# Patient Record
Sex: Male | Born: 1945 | Race: Black or African American | Hispanic: No | Marital: Married | Smoking: Former smoker
Health system: Southern US, Community
[De-identification: ages and names within clinical notes are randomized; demographics above are authoritative.]

## PROBLEM LIST (undated history)

## (undated) DIAGNOSIS — E785 Hyperlipidemia, unspecified: Secondary | ICD-10-CM

## (undated) DIAGNOSIS — F32A Depression, unspecified: Secondary | ICD-10-CM

## (undated) DIAGNOSIS — I10 Essential (primary) hypertension: Secondary | ICD-10-CM

## (undated) DIAGNOSIS — F329 Major depressive disorder, single episode, unspecified: Secondary | ICD-10-CM

## (undated) DIAGNOSIS — J449 Chronic obstructive pulmonary disease, unspecified: Secondary | ICD-10-CM

## (undated) DIAGNOSIS — E119 Type 2 diabetes mellitus without complications: Secondary | ICD-10-CM

## (undated) DIAGNOSIS — E1142 Type 2 diabetes mellitus with diabetic polyneuropathy: Secondary | ICD-10-CM

## (undated) DIAGNOSIS — F4310 Post-traumatic stress disorder, unspecified: Secondary | ICD-10-CM

---

## 1981-08-24 HISTORY — PX: OTHER SURGICAL HISTORY: SHX169

## 2001-10-03 ENCOUNTER — Encounter: Payer: Self-pay | Admitting: Internal Medicine

## 2001-10-03 ENCOUNTER — Inpatient Hospital Stay (HOSPITAL_COMMUNITY): Admission: EM | Admit: 2001-10-03 | Discharge: 2001-10-05 | Payer: Self-pay | Admitting: Emergency Medicine

## 2001-10-05 ENCOUNTER — Encounter: Payer: Self-pay | Admitting: Internal Medicine

## 2011-03-26 HISTORY — PX: OTHER SURGICAL HISTORY: SHX169

## 2011-09-15 DIAGNOSIS — R079 Chest pain, unspecified: Secondary | ICD-10-CM

## 2012-11-21 ENCOUNTER — Encounter (HOSPITAL_COMMUNITY)
Admission: RE | Admit: 2012-11-21 | Discharge: 2012-11-21 | Disposition: A | Discharge status: Home / Self Care | Payer: Non-veteran care | Source: Ambulatory Visit | Attending: *Deleted | Admitting: *Deleted

## 2012-11-21 ENCOUNTER — Encounter (HOSPITAL_COMMUNITY): Payer: Self-pay

## 2012-11-21 VITALS — BP 128/82 | HR 85 | Ht 64.0 in | Wt 160.1 lb

## 2012-11-21 DIAGNOSIS — J449 Chronic obstructive pulmonary disease, unspecified: Secondary | ICD-10-CM

## 2012-11-21 NOTE — Patient Instructions (Signed)
Pt has finished orientation and is scheduled to start PR on _10/6/14 at 1 pm. Pt has been instructed to arrive to class 15 minutes early for scheduled class. Pt has been instructed to wear comfortable clothing and shoes with rubber soles. Pt has been told to take their medications 1 hour prior to coming to class.  If the patient is not going to attend class, he/she has been instructed to call.

## 2012-11-21 NOTE — Progress Notes (Addendum)
Mark Schroeder 67 y.o. male  Initial Psychosocial Assessment  Pt psychosocial assessment reveals pt lives with their spouse. Pt is currently unemployed, disabled. Pt hobbies include music. Pt reports his stress level is low. Areas of stress/anxiety include Health.  Pt does not exhibit signs of depression. Signs of depression include none and none. Pt shows fair  coping skills with positive outlook . Family Offered emotional support and reassurance. Monitor and evaluate progress toward psychosocial goal(s).  Goal(s): Improved management of pain with exercise.  Improved coping skills Help patient work toward returning to meaningful activities that improve patient's QOL and are attainable with patient's lung disease   Dr. [place] Bing: __________________________  Date: ___________   11/21/2012 11:34 AM

## 2012-11-21 NOTE — Progress Notes (Signed)
[facility] Dr. Sherre Scarlet is his Pulmonologist. During orientation advised patient on arrival and appointment times what to wear, what to do before, during and after exercise. Reviewed attendance and class policy. Talked about inclement weather and class consultation policy. Pt is scheduled to start Pulm Rehab on 11/27/12 at 1 pm. Pt was advised to come to class 5 minutes before class starts. He was also given instructions on meeting with the dietician and attending the Family Structure classes. Pt is eager to get started. Patient not able to do six minute walk test or bike test due to amputation.

## 2012-11-27 ENCOUNTER — Encounter (HOSPITAL_COMMUNITY)
Admission: RE | Admit: 2012-11-27 | Discharge: 2012-11-27 | Disposition: A | Discharge status: Home / Self Care | Payer: Non-veteran care | Source: Ambulatory Visit | Attending: *Deleted | Admitting: *Deleted

## 2012-11-27 DIAGNOSIS — J449 Chronic obstructive pulmonary disease, unspecified: Secondary | ICD-10-CM | POA: Insufficient documentation

## 2012-11-27 DIAGNOSIS — J4489 Other specified chronic obstructive pulmonary disease: Secondary | ICD-10-CM | POA: Insufficient documentation

## 2012-11-27 DIAGNOSIS — Z5189 Encounter for other specified aftercare: Secondary | ICD-10-CM | POA: Insufficient documentation

## 2012-11-29 ENCOUNTER — Encounter (HOSPITAL_COMMUNITY)
Admission: RE | Admit: 2012-11-29 | Discharge: 2012-11-29 | Disposition: A | Discharge status: Home / Self Care | Payer: Non-veteran care | Source: Ambulatory Visit | Attending: *Deleted | Admitting: *Deleted

## 2012-12-04 ENCOUNTER — Encounter (HOSPITAL_COMMUNITY)
Admission: RE | Admit: 2012-12-04 | Discharge: 2012-12-04 | Disposition: A | Discharge status: Home / Self Care | Payer: Non-veteran care | Source: Ambulatory Visit | Attending: *Deleted | Admitting: *Deleted

## 2012-12-06 ENCOUNTER — Encounter (HOSPITAL_COMMUNITY)
Admission: RE | Admit: 2012-12-06 | Discharge: 2012-12-06 | Disposition: A | Discharge status: Home / Self Care | Payer: Non-veteran care | Source: Ambulatory Visit | Attending: *Deleted | Admitting: *Deleted

## 2012-12-06 NOTE — Progress Notes (Signed)
Mark Schroeder 67 y.o. male  Patient 30 day Psychosocial Note  Patient psychosocial assessment reveals no barriers to participation in Pulmonary Rehab. There are currently no Psychosocial areas that are currently affecting patient's rehab experience. Patient does continue to exhibit positive coping skills to deal with his psychosocial concerns. Offered emotional support and reassurance. Patient does feel he is making progress toward Pulmonary Rehab goals. Patient reports his health and activity level has improved in the past 30 days as evidenced by patient's report of increased ability to move around better with less times of being SOB. Patient states family/friends have noticed changes in his activity or mood. Patient reports feeling positive about current and projected progression in Pulmonary Rehab. After reviewing the patient's treatment plan, the patient is making progress toward Pulmonary Rehab goals. Patient's rate of progress toward rehab goals is good. Plan of action to help patient continue to work towards rehab goals include continue to increase his levels to increase his strength and endurance. Will continue to monitor and evaluate progress toward psychosocial goal(s).  Goal(s) in progress: Continue to improve pain management of with exercise.  Continue to Improved coping skills Continue to help patient work toward returning to meaningful activities that improve patient's QOL and are attainable with patient's lung disease    Dr. [place] Bing: _________________________   Date: __________________ Medical Director

## 2012-12-06 NOTE — Progress Notes (Signed)
Pulmonary Rehab/Respiratory Services Outcomes Entrance   Anthropometrics:    Entrance:  Height: 71 inches Weight (kg): 78.4kg Grip Strength: 70  Functional Status/Exercise Capacity:  Mark Schroeder had a resting HR of 68 BPM, a resting BP of 118/72, and an oxygen saturation of 97% on 2LO2. Mark Schroeder performed a 6-minute walk test on 11/14/12. The patient completed 668 feet in 6 minutes with 3-4 rest breaks. This quantifies 1.96 mets.    Dyspnea Measures:  The [facility] is a simple and standardized method of classifying disability in patients with COPD. The assessment correlates disability and dyspnea. At entrance the patient scored a 2.   The patient completed the [place] [facility] Shortness of Breath Questionnaire ([facility] [place]). This questionnaire  relates activities of daily living and shortness of breath. The score ranges from 0-120, a higher score relates to severe shortness of breath during activities of daily living. The patient's score at entrance was 35.   Quality of Life:  Ferrans and Powers Quality of Life Index Pulmonary version is used to assess the patient satisfaction in different domains of their life; health and functioning, socioeconomic, psychological/spiritual, and family. The overall score is recorded out of 30 points. The patient's goal is to achieve an overall score of 21 or higher. The patient received a score of 21 at entrance.   The Patient Health Questionnaire (PHQ-9) assesses the degree of depression. Depression is important to monitor and track in pulmonary patients due to its prevalence in the population. The goal for a patient is to score less than 4 on this assessment. Mark Schroeder scored 0 at entrance.  Clinical Assessment Tools:  The COPD Assessment Test (CAT) is a measurement tool to quantify how much of an impact the disease has on the patient's life. The assessment aids the Pulmonary Rehab Team in designing the patients individualized  treatment plan. A CAT score ranges from 0-40. A score of 10 or below indicates that COPD has a low impact on the patient's life whereas a score of 30 or higher indicates a severe impact. The patient's goal is a decrease of 1 point from entrance to discharge. Mark Schroeder had a CAT score of        At entrance.   Nutrition:   The "Rate My Plate" is a dietary assessment that quantifies the balance of a patient's diet. The tool allows the Pulmonary Rehab Team to key in on the areas of the patient's diet that needs improving. The team can then focus their nutritional education on those areas. If the patient scores 23-38, this means there are many ways they can make their eating habits healthier, 39-54 states there are some ways they can make their eating habits healthier and a score of 55-69 states that they are making many healthy choices. Mark Schroeder achieved a score of        /14 at entrance.   Education:   Mark Schroeder has only come to 5 Pulmonary sessions at this time and in this time period has attended 2 classes. The classes that will be offered to the patient before graduation are How Lungs Work and Disease, Exercise, Environmental Irritants, Meds/Inhalers and Oxygen, Energy Saving Techniques/Energy Conservation, Bronchial Hygiene/Pursed Lip Breathing, Cleaning Equipment, Nutrition I Fats, Nutrition II Labels, Respiratory Infections, Stress I & II, Oxygen for Home and Travel. The patient completed an education assessment at the entrance of the program and will at discharge. This assessment included 14 questions regarding all of the education topics above.  Mark Schroeder achieved a score of 7/14 at entrance.   Exercise:   Mark Schroeder was provided with a Home Exercise Prescription (HEP) at the entrance of the program. The patient was followed by the Exercise Physiologist throughout the program to assist with progressing the frequency, intensity, time and type of exercise. At entrance of the program the patient was  exercise 0 # of days at home. The goal is for the patient to exercise at home on days not coming to the program. At the point of discharge this will be assessed to measure any improvement.

## 2012-12-07 NOTE — Progress Notes (Addendum)
Pulmonary Rehab/Respiratory Services Outcomes Entrance   Anthropometrics:    Entrance:  Height: 64 inches Weight (kg): 72.6kg Grip Strength: 82  Functional Status/Exercise Capacity:  Mark Schroeder had a resting HR of 85 BPM, a resting BP of 128/82, and an oxygen saturation of 98% on no LO2. Mark Schroeder did not perform a 6-minute walk test on 11/21/12 due to prosthetic leg. Marland Kitchen    Dyspnea Measures:  The [facility] is a simple and standardized method of classifying disability in patients with COPD. The assessment correlates disability and dyspnea. At entrance the patient scored a 4.   The patient completed the [place] [facility] Shortness of Breath Questionnaire ([facility] [place]). This questionnaire  relates activities of daily living and shortness of breath. The score ranges from 0-120, a higher score relates to severe shortness of breath during activities of daily living. The patient's score at entrance was 92.   Quality of Life:  Ferrans and Powers Quality of Life Index Pulmonary version is used to assess the patient satisfaction in different domains of their life; health and functioning, socioeconomic, psychological/spiritual, and family. The overall score is recorded out of 30 points. The patient's goal is to achieve an overall score of 21 or higher. The patient received a score of 20.06 at entrance.   The Patient Health Questionnaire (PHQ-9) assesses the degree of depression. Depression is important to monitor and track in pulmonary patients due to its prevalence in the population. The goal for a patient is to score less than 4 on this assessment. Mark Schroeder scored 0 at entrance.  Clinical Assessment Tools:  The COPD Assessment Test (CAT) is a measurement tool to quantify how much of an impact the disease has on the patient's life. The assessment aids the Pulmonary Rehab Team in designing the patients individualized treatment plan. A CAT score ranges from 0-40. A score of 10 or  below indicates that COPD has a low impact on the patient's life whereas a score of 30 or higher indicates a severe impact. The patient's goal is a decrease of 1 point from entrance to discharge. Mark Schroeder had a CAT score of  24  At entrance.   Nutrition:   The "Rate My Plate" is a dietary assessment that quantifies the balance of a patient's diet. The tool allows the Pulmonary Rehab Team to key in on the areas of the patient's diet that needs improving. The team can then focus their nutritional education on those areas. If the patient scores 23-38, this means there are many ways they can make their eating habits healthier, 39-54 states there are some ways they can make their eating habits healthier and a score of 55-69 states that they are making many healthy choices. Mark Schroeder achieved a score of  38/14 at entrance.   Education:   Mark Schroeder has only come to 4 Pulmonary sessions at this time and in this time period has attended 1 class. The classes that will be offered to the patient before graduation are How Lungs Work and Disease, Exercise, Environmental Irritants, Meds/Inhalers and Oxygen, Energy Saving Techniques/Energy Conservation, Bronchial Hygiene/Pursed Lip Breathing, Cleaning Equipment, Nutrition I Fats, Nutrition II Labels, Respiratory Infections, Stress I & II, Oxygen for Home and Travel. The patient completed an education assessment at the entrance of the program and will at discharge. This assessment included 14 questions regarding all of the education topics above. Mark Schroeder achieved a score of 8/14 at entrance.   Exercise:   Mark Schroeder was provided  with a Home Exercise Prescription (HEP) at the entrance of the program. The patient was followed by the Exercise Physiologist throughout the program to assist with progressing the frequency, intensity, time and type of exercise. At entrance of the program the patient was exercise 0 # of days at home. The goal is for the patient to exercise  at home on days not coming to the program. At the point of discharge this will be assessed to measure any improvement.     Dr. Ewa Gentry Bing: ____________________________  Date: ____________ Medical Director

## 2012-12-11 ENCOUNTER — Encounter (HOSPITAL_COMMUNITY)
Admission: RE | Admit: 2012-12-11 | Discharge: 2012-12-11 | Disposition: A | Discharge status: Home / Self Care | Payer: Non-veteran care | Source: Ambulatory Visit | Attending: *Deleted | Admitting: *Deleted

## 2012-12-13 ENCOUNTER — Encounter (HOSPITAL_COMMUNITY)
Admission: RE | Admit: 2012-12-13 | Discharge: 2012-12-13 | Disposition: A | Discharge status: Home / Self Care | Payer: Non-veteran care | Source: Ambulatory Visit | Attending: *Deleted | Admitting: *Deleted

## 2012-12-18 ENCOUNTER — Encounter (HOSPITAL_COMMUNITY)
Admission: RE | Admit: 2012-12-18 | Discharge: 2012-12-18 | Disposition: A | Discharge status: Home / Self Care | Payer: Non-veteran care | Source: Ambulatory Visit | Attending: *Deleted | Admitting: *Deleted

## 2012-12-20 ENCOUNTER — Encounter (HOSPITAL_COMMUNITY)
Admission: RE | Admit: 2012-12-20 | Discharge: 2012-12-20 | Disposition: A | Discharge status: Home / Self Care | Payer: Non-veteran care | Source: Ambulatory Visit | Attending: *Deleted | Admitting: *Deleted

## 2012-12-25 ENCOUNTER — Encounter (HOSPITAL_COMMUNITY): Payer: Non-veteran care

## 2012-12-27 ENCOUNTER — Encounter (HOSPITAL_COMMUNITY)
Admission: RE | Admit: 2012-12-27 | Discharge: 2012-12-27 | Disposition: A | Discharge status: Home / Self Care | Payer: Non-veteran care | Source: Ambulatory Visit | Attending: *Deleted | Admitting: *Deleted

## 2012-12-27 DIAGNOSIS — J449 Chronic obstructive pulmonary disease, unspecified: Secondary | ICD-10-CM | POA: Insufficient documentation

## 2012-12-27 DIAGNOSIS — Z5189 Encounter for other specified aftercare: Secondary | ICD-10-CM | POA: Insufficient documentation

## 2012-12-27 DIAGNOSIS — J4489 Other specified chronic obstructive pulmonary disease: Secondary | ICD-10-CM | POA: Insufficient documentation

## 2012-12-29 NOTE — Progress Notes (Addendum)
Mark Schroeder 67 y.o. male  Patient 60 day Psychosocial Note  Patient psychosocial assessment reveals no barriers to participation in Pulmonary Rehab. There are currently no Psychosocial areas that are currently affecting patient's rehab experience. This patient is a amputee of (R) leg. This tends to make him a little more reserved and quiet. Patient does continue to exhibit positive coping skills to deal with his amputation. Staff continues to offered emotional support and reassurance by engaging him and making heim feel like a part of the group. Patient does feel heis making progress toward Pulmonary Rehab goals. Patient reports his health and activity level has improved in the past 30 days as evidenced by patient's report of increased ability to return to some weight training with limited SOB. Patient has not stated family/friends have noticed changes in his activity or mood. Patient reports he is feeling positive about current and projected progression in Pulmonary Rehab. After reviewing the patient's treatment plan, the patient is making progress toward Pulmonary Rehab goals. Patient's rate of progress toward rehab goals is good. Plan of action to help patient continue to work towards rehab goals include increased strength and endurance. Will continue to monitor and evaluate progress toward psychosocial goal(s).  Goal(s) in progress: Continue to improve pain management of of leg with exercise.  Continue to Improved coping skills as it pertains to being apart of the group.  Continue to help patient work toward returning to meaningful activities that improve patient's QOL and are attainable with patient's lung disease.    Dr. Prentice Docker: ______________________________ Date: ___________Time: ________ Medial Director

## 2013-01-01 ENCOUNTER — Encounter (HOSPITAL_COMMUNITY)
Admission: RE | Admit: 2013-01-01 | Discharge: 2013-01-01 | Disposition: A | Discharge status: Home / Self Care | Payer: Non-veteran care | Source: Ambulatory Visit | Attending: *Deleted | Admitting: *Deleted

## 2013-01-03 ENCOUNTER — Encounter (HOSPITAL_COMMUNITY)
Admission: RE | Admit: 2013-01-03 | Discharge: 2013-01-03 | Disposition: A | Discharge status: Home / Self Care | Payer: Non-veteran care | Source: Ambulatory Visit | Attending: *Deleted | Admitting: *Deleted

## 2013-01-08 ENCOUNTER — Encounter (HOSPITAL_COMMUNITY): Payer: Non-veteran care

## 2013-01-10 ENCOUNTER — Encounter (HOSPITAL_COMMUNITY): Payer: Non-veteran care

## 2013-01-15 ENCOUNTER — Encounter (HOSPITAL_COMMUNITY)
Admission: RE | Admit: 2013-01-15 | Discharge: 2013-01-15 | Disposition: A | Discharge status: Home / Self Care | Payer: Non-veteran care | Source: Ambulatory Visit | Attending: *Deleted | Admitting: *Deleted

## 2013-01-17 ENCOUNTER — Encounter (HOSPITAL_COMMUNITY): Payer: Non-veteran care

## 2013-01-22 ENCOUNTER — Encounter (HOSPITAL_COMMUNITY): Payer: Non-veteran care

## 2013-01-24 ENCOUNTER — Encounter (HOSPITAL_COMMUNITY)
Admission: RE | Admit: 2013-01-24 | Discharge: 2013-01-24 | Disposition: A | Discharge status: Home / Self Care | Payer: Non-veteran care | Source: Ambulatory Visit | Attending: *Deleted | Admitting: *Deleted

## 2013-01-24 DIAGNOSIS — J449 Chronic obstructive pulmonary disease, unspecified: Secondary | ICD-10-CM | POA: Insufficient documentation

## 2013-01-24 DIAGNOSIS — Z5189 Encounter for other specified aftercare: Secondary | ICD-10-CM | POA: Insufficient documentation

## 2013-01-24 DIAGNOSIS — J4489 Other specified chronic obstructive pulmonary disease: Secondary | ICD-10-CM | POA: Insufficient documentation

## 2013-01-29 ENCOUNTER — Encounter (HOSPITAL_COMMUNITY)
Admission: RE | Admit: 2013-01-29 | Discharge: 2013-01-29 | Disposition: A | Discharge status: Home / Self Care | Payer: Non-veteran care | Source: Ambulatory Visit | Attending: *Deleted | Admitting: *Deleted

## 2013-01-31 ENCOUNTER — Encounter (HOSPITAL_COMMUNITY)
Admission: RE | Admit: 2013-01-31 | Discharge: 2013-01-31 | Disposition: A | Discharge status: Home / Self Care | Payer: Non-veteran care | Source: Ambulatory Visit | Attending: *Deleted | Admitting: *Deleted

## 2013-02-05 ENCOUNTER — Encounter (HOSPITAL_COMMUNITY)
Admission: RE | Admit: 2013-02-05 | Discharge: 2013-02-05 | Disposition: A | Discharge status: Home / Self Care | Payer: Non-veteran care | Source: Ambulatory Visit | Attending: *Deleted | Admitting: *Deleted

## 2013-02-06 NOTE — Progress Notes (Signed)
Mark Schroeder 67 y.o. male  90 day day Psychosocial Note  Patient psychosocial assessment reveals no barriers to participation in Pulmonary Rehab. Psychosocial areas that are currently affecting patient's rehab experience include concerns about having to deal with his granddaughter from time to time. He is a amputee of (R) leg and has begun to adjust to his classmates knowing this about him. Patient does continue to exhibit positive coping skills to deal with his amputation. Offered emotional support and reassurance. Patient does feel he is making progress toward Pulmonary Rehab goals. Patient reports his health and activity level has improved in the past 30 days as evidenced by patient's report of increased ability to do more. Patient states family/friends have noticed changes in his activity or mood. Patient reports is feeling positive about current and projected progression in Pulmonary Rehab. After reviewing the patient's treatment plan, the patient is making progress toward Pulmonary Rehab goals. Patient's rate of progress toward rehab goals is excellent. Plan of action to help patient continue to work towards rehab goals include continued strength and endurance. Will continue to monitor and evaluate progress toward psychosocial goal(s). [facility] [place] [facility] so that he can continue to improve.   Goal(s) in progress: Continue to improve pain mgmt of his leg with exercise.  Continue to be an active member of the group experience.  Help patient work toward returning to meaningful activities that improve patient's QOL and are attainable with patient's lung disease   Dr. Laqueta Linden: _____________Date:__________Time__________________ Medical Director

## 2013-02-07 ENCOUNTER — Encounter (HOSPITAL_COMMUNITY): Payer: Non-veteran care

## 2013-02-12 ENCOUNTER — Encounter (HOSPITAL_COMMUNITY)
Admission: RE | Admit: 2013-02-12 | Discharge: 2013-02-12 | Disposition: A | Discharge status: Home / Self Care | Payer: Non-veteran care | Source: Ambulatory Visit | Attending: *Deleted | Admitting: *Deleted

## 2013-02-14 ENCOUNTER — Encounter (HOSPITAL_COMMUNITY): Payer: Non-veteran care

## 2013-02-19 ENCOUNTER — Encounter (HOSPITAL_COMMUNITY)
Admission: RE | Admit: 2013-02-19 | Discharge: 2013-02-19 | Disposition: A | Discharge status: Home / Self Care | Payer: Non-veteran care | Source: Ambulatory Visit | Attending: *Deleted | Admitting: *Deleted

## 2013-02-21 ENCOUNTER — Encounter (HOSPITAL_COMMUNITY): Payer: Non-veteran care

## 2013-02-26 ENCOUNTER — Encounter (HOSPITAL_COMMUNITY): Payer: PRIVATE HEALTH INSURANCE

## 2013-02-28 ENCOUNTER — Encounter (HOSPITAL_COMMUNITY): Payer: PRIVATE HEALTH INSURANCE

## 2013-03-05 ENCOUNTER — Encounter (HOSPITAL_COMMUNITY)
Admission: RE | Admit: 2013-03-05 | Discharge: 2013-03-05 | Disposition: A | Discharge status: Home / Self Care | Payer: PRIVATE HEALTH INSURANCE | Source: Ambulatory Visit | Attending: *Deleted | Admitting: *Deleted

## 2013-03-05 DIAGNOSIS — Z5189 Encounter for other specified aftercare: Secondary | ICD-10-CM | POA: Insufficient documentation

## 2013-03-05 DIAGNOSIS — J449 Chronic obstructive pulmonary disease, unspecified: Secondary | ICD-10-CM | POA: Insufficient documentation

## 2013-03-05 DIAGNOSIS — J4489 Other specified chronic obstructive pulmonary disease: Secondary | ICD-10-CM | POA: Insufficient documentation

## 2013-03-07 ENCOUNTER — Encounter (HOSPITAL_COMMUNITY)
Admission: RE | Admit: 2013-03-07 | Discharge: 2013-03-07 | Disposition: A | Discharge status: Home / Self Care | Payer: PRIVATE HEALTH INSURANCE | Source: Ambulatory Visit | Attending: *Deleted | Admitting: *Deleted

## 2013-03-12 ENCOUNTER — Encounter (HOSPITAL_COMMUNITY)
Admission: RE | Admit: 2013-03-12 | Discharge: 2013-03-12 | Disposition: A | Discharge status: Home / Self Care | Payer: PRIVATE HEALTH INSURANCE | Source: Ambulatory Visit | Attending: *Deleted | Admitting: *Deleted

## 2013-03-14 ENCOUNTER — Encounter (HOSPITAL_COMMUNITY): Payer: PRIVATE HEALTH INSURANCE

## 2013-03-19 ENCOUNTER — Encounter (HOSPITAL_COMMUNITY)
Admission: RE | Admit: 2013-03-19 | Discharge: 2013-03-19 | Disposition: A | Discharge status: Home / Self Care | Payer: PRIVATE HEALTH INSURANCE | Source: Ambulatory Visit | Attending: *Deleted | Admitting: *Deleted

## 2013-03-20 NOTE — Psychosocial Assessment (Signed)
Mark Schroeder 68 y.o. male  120 day Psychosocial Note  Patient psychosocial assessment reveals no barriers to participation in Pulmonary Rehab. Psychosocial areas that are currently affecting patient's rehab experience include concerns about family problems surrounding his granddaughter from time to time. Patient does continue to exhibit positive coping skills to deal with his psychosocial concerns. Offered emotional support and reassurance. Patient does feel he is making progress toward Pulmonary Rehab goals. Patient reports his health and activity level has improved in the past 30 days as evidenced by patient's report of increased ability to do more and carry out ADLs. Patient states family/friends have noticed changes in hisactivity or mood. Patient reports he is feeling positive about current and projected progression in Pulmonary Rehab. After reviewing the patient's treatment plan, the patient is making progress toward Pulmonary Rehab goals. Patient's rate of progress toward rehab goals is excellent. Plan of action to help patient continue to work towards rehab goals include continued strength and endurance. Will continue to monitor and evaluate progress toward psychosocial goal(s). [facility] so that patient can get his 36 sessions.   Goal(s) in progress: Improved pain management of his leg during exercise.  Continue to be an active participant of the group experience.  Help patient work toward returning to meaningful activities that improve patient's QOL and are attainable with patient's lung disease.     Dr. Laqueta LindenSuresh A. Koneswaran: _____________ Date: ___________ Time: _____________ Medical Director

## 2013-03-21 ENCOUNTER — Encounter (HOSPITAL_COMMUNITY)
Admission: RE | Admit: 2013-03-21 | Discharge: 2013-03-21 | Disposition: A | Discharge status: Home / Self Care | Payer: PRIVATE HEALTH INSURANCE | Source: Ambulatory Visit | Attending: *Deleted | Admitting: *Deleted

## 2013-03-26 ENCOUNTER — Encounter (HOSPITAL_COMMUNITY)
Admission: RE | Admit: 2013-03-26 | Discharge: 2013-03-26 | Disposition: A | Discharge status: Home / Self Care | Payer: Non-veteran care | Source: Ambulatory Visit | Attending: *Deleted | Admitting: *Deleted

## 2013-03-26 DIAGNOSIS — Z5189 Encounter for other specified aftercare: Secondary | ICD-10-CM | POA: Insufficient documentation

## 2013-03-26 DIAGNOSIS — J4489 Other specified chronic obstructive pulmonary disease: Secondary | ICD-10-CM | POA: Insufficient documentation

## 2013-03-26 DIAGNOSIS — J449 Chronic obstructive pulmonary disease, unspecified: Secondary | ICD-10-CM | POA: Insufficient documentation

## 2013-03-28 ENCOUNTER — Encounter (HOSPITAL_COMMUNITY)
Admission: RE | Admit: 2013-03-28 | Discharge: 2013-03-28 | Disposition: A | Discharge status: Home / Self Care | Payer: Non-veteran care | Source: Ambulatory Visit | Attending: *Deleted | Admitting: *Deleted

## 2013-04-02 ENCOUNTER — Encounter (HOSPITAL_COMMUNITY)
Admission: RE | Admit: 2013-04-02 | Discharge: 2013-04-02 | Disposition: A | Discharge status: Home / Self Care | Payer: Non-veteran care | Source: Ambulatory Visit | Attending: *Deleted | Admitting: *Deleted

## 2013-04-04 ENCOUNTER — Encounter (HOSPITAL_COMMUNITY)
Admission: RE | Admit: 2013-04-04 | Discharge: 2013-04-04 | Disposition: A | Discharge status: Home / Self Care | Payer: Non-veteran care | Source: Ambulatory Visit | Attending: *Deleted | Admitting: *Deleted

## 2013-04-09 ENCOUNTER — Encounter (HOSPITAL_COMMUNITY)
Admission: RE | Admit: 2013-04-09 | Discharge: 2013-04-09 | Disposition: A | Discharge status: Home / Self Care | Payer: Non-veteran care | Source: Ambulatory Visit | Attending: *Deleted | Admitting: *Deleted

## 2013-04-11 ENCOUNTER — Encounter (HOSPITAL_COMMUNITY): Payer: Non-veteran care

## 2013-04-11 NOTE — Addendum Note (Signed)
Encounter addended by: Angelica PouNancy L Warda Mcqueary, RN on: 04/11/2013  3:38 PM<BR>     Documentation filed: Clinical Notes

## 2013-04-11 NOTE — Progress Notes (Signed)
Pulmonary Rehabilitation Program Outcomes Report   Orientation:  11/21/2012 Halfway Report: 02/19/2013 Graduate Date:  tbd Discharge Date:  tbd # of sessions completed: 18 DX: COPD  Pulmonologist: welty-Wolf Family MD:  Reid [place] Class Time:  13:00  A.  Exercise Program:  Tolerates exercise @ 3.76 METS for 15 minutes  B.  Mental Health:  Good mental attitude  C.  Education/Instruction/Skills  Accurately checks own pulse.  Rest:  96  Exercise:  130, Knows THR for exercise and Uses Perceived Exertion Scale and/or Dyspnea Scale  Uses Perceived Exertion Scale and/or Dyspnea Scale  D.  Nutrition/Weight Control/Body Composition:  Adherence to prescribed nutrition program: good    E.  Blood Lipids    No results found for this basename: CHOL, HDL, LDLCALC, LDLDIRECT, TRIG, CHOLHDL    F.  Lifestyle Changes:  Making positive lifestyle changes and Continues to smoke  G.  Symptoms noted with exercise:  Asymptomatic  Report Completed By:  Lelon HuhNancy L. Anuoluwapo Mefferd RN   Comments:  This is patients halfway report. He has done well while in rehab. He achieved a peak METS of 3.76. His resting HR was 96 and resting BP was 150/90 and his peak HR was 130 and peak BP was 160/90. A graduation report will follow upon his 36th visit.

## 2013-04-11 NOTE — Addendum Note (Signed)
Encounter addended by: Angelica PouNancy L Skyeler Smola, RN on: 04/11/2013  3:37 PM<BR>     Documentation filed: Notes Section

## 2013-04-11 NOTE — Progress Notes (Signed)
Pulmonary Rehabilitation Program Outcomes Report   Orientation:  11/21/2012 1st week report: 12/04/2012 Graduate Date:  tbd Discharge Date:  tbd # of sessions completed: 3 ZO:XWRUX:COPD  Pulmonologist: Welty-Wolf Family MD:  Dr. [place] Reid Class Time:  13:00  A.  Exercise Program:  Tolerates exercise @ 3.76 METS for 15 minutes and Walk Test Results:  Pre: No Walk test done due to patients artifical Leg.  B.  Mental Health:  Good mental attitude  C.  Education/Instruction/Skills  Accurately checks own pulse.  Rest:  99  Exercise:  131, Knows THR for exercise and Uses Perceived Exertion Scale and/or Dyspnea Scale  Uses Perceived Exertion Scale and/or Dyspnea Scale  D.  Nutrition/Weight Control/Body Composition:  Adherence to prescribed nutrition program: good    E.  Blood Lipids    No results found for this basename: CHOL, HDL, LDLCALC, LDLDIRECT, TRIG, CHOLHDL    F.  Lifestyle Changes:  Making positive lifestyle changes and Continues to smoke  G.  Symptoms noted with exercise:  Asymptomatic  Report Completed By:  Harriett SineNancy l. Vilma Will RN   Comments:  This is patients 1st week report. He has done well his first week. He achieved a peak METS of 3.76. His resting HR was 99 and resting BP was 130/70, His peak HR was 131 and his peak BP was 120/70. A halfway report will follow upon patients 18 th visit.

## 2013-04-11 NOTE — Addendum Note (Signed)
Encounter addended by: Angelica PouNancy L Lanya Bucks, RN on: 04/11/2013  3:13 PM<BR>     Documentation filed: Notes Section

## 2013-04-11 NOTE — Addendum Note (Signed)
Encounter addended by: Angelica PouNancy L Tatsuya Okray, RN on: 04/11/2013  3:14 PM<BR>     Documentation filed: Clinical Notes

## 2013-04-16 ENCOUNTER — Encounter (HOSPITAL_COMMUNITY)
Admission: RE | Admit: 2013-04-16 | Discharge: 2013-04-16 | Disposition: A | Discharge status: Home / Self Care | Payer: Non-veteran care | Source: Ambulatory Visit | Attending: *Deleted | Admitting: *Deleted

## 2013-04-18 ENCOUNTER — Encounter (HOSPITAL_COMMUNITY)
Admission: RE | Admit: 2013-04-18 | Discharge: 2013-04-18 | Disposition: A | Discharge status: Home / Self Care | Payer: Non-veteran care | Source: Ambulatory Visit | Attending: *Deleted | Admitting: *Deleted

## 2013-04-23 ENCOUNTER — Encounter (HOSPITAL_COMMUNITY)
Admission: RE | Admit: 2013-04-23 | Discharge: 2013-04-23 | Disposition: A | Discharge status: Home / Self Care | Payer: Non-veteran care | Source: Ambulatory Visit | Attending: *Deleted | Admitting: *Deleted

## 2013-04-23 DIAGNOSIS — J449 Chronic obstructive pulmonary disease, unspecified: Secondary | ICD-10-CM | POA: Insufficient documentation

## 2013-04-23 DIAGNOSIS — J4489 Other specified chronic obstructive pulmonary disease: Secondary | ICD-10-CM | POA: Insufficient documentation

## 2013-04-23 DIAGNOSIS — Z5189 Encounter for other specified aftercare: Secondary | ICD-10-CM | POA: Insufficient documentation

## 2013-04-25 ENCOUNTER — Encounter (HOSPITAL_COMMUNITY)
Admission: RE | Admit: 2013-04-25 | Discharge: 2013-04-25 | Disposition: A | Discharge status: Home / Self Care | Payer: Non-veteran care | Source: Ambulatory Visit | Attending: *Deleted | Admitting: *Deleted

## 2013-04-30 ENCOUNTER — Encounter (HOSPITAL_COMMUNITY)
Admission: RE | Admit: 2013-04-30 | Discharge: 2013-04-30 | Disposition: A | Discharge status: Home / Self Care | Payer: Non-veteran care | Source: Ambulatory Visit | Attending: *Deleted | Admitting: *Deleted

## 2013-05-02 ENCOUNTER — Encounter (HOSPITAL_COMMUNITY): Payer: Non-veteran care

## 2013-05-07 ENCOUNTER — Encounter (HOSPITAL_COMMUNITY)
Admission: RE | Admit: 2013-05-07 | Discharge: 2013-05-07 | Disposition: A | Discharge status: Home / Self Care | Payer: Non-veteran care | Source: Ambulatory Visit | Attending: *Deleted | Admitting: *Deleted

## 2013-05-09 ENCOUNTER — Encounter (HOSPITAL_COMMUNITY)
Admission: RE | Admit: 2013-05-09 | Discharge: 2013-05-09 | Disposition: A | Discharge status: Home / Self Care | Payer: Non-veteran care | Source: Ambulatory Visit | Attending: *Deleted | Admitting: *Deleted

## 2013-05-14 ENCOUNTER — Encounter (HOSPITAL_COMMUNITY): Payer: Non-veteran care

## 2013-05-16 ENCOUNTER — Encounter (HOSPITAL_COMMUNITY)
Admission: RE | Admit: 2013-05-16 | Discharge: 2013-05-16 | Disposition: A | Discharge status: Home / Self Care | Payer: Non-veteran care | Source: Ambulatory Visit | Attending: *Deleted | Admitting: *Deleted

## 2013-05-21 ENCOUNTER — Encounter (HOSPITAL_COMMUNITY): Payer: Non-veteran care

## 2013-05-22 NOTE — Progress Notes (Signed)
Pulmonary Rehabilitation Program Outcomes Report   Orientation:  11/21/2012 Graduate Date:  05/16/2013 Discharge Date:  05/16/2013 # of sessions completed: 36 DX: COPD  Pulmonologist: Eugene GarnetWelty-Wolf, Karen Family MD:  [place] Reid Class Time:  13:00(1 pm)  A.  Exercise Program:  Tolerates exercise @ 3.77 METS for 15 minutes, Walk Test Results:  Post: Post walk Test not performed due to prosthetic Leg. and Discharged to home exercise program.  Anticipated compliance:  excellent  B.  Mental Health:  Good mental attitude  C.  Education/Instruction/Skills  Accurately checks own pulse.  Rest:  99  Exercise:  110, Knows THR for exercise, Uses Perceived Exertion Scale and/or Dyspnea Scale and Attended 14 education classes  Demonstrates accurate diaphragmatic breathing  D.  Nutrition/Weight Control/Body Composition:  Adherence to prescribed nutrition program: good    E.  Blood Lipids    No results found for this basename: CHOL, HDL, LDLCALC, LDLDIRECT, TRIG, CHOLHDL    F.  Lifestyle Changes:  Making positive lifestyle changes and Continues to smoke  G.  Symptoms noted with exercise:  Asymptomatic  Report Completed By:  Lelon HuhNancy L. Seriyah Collison RN   Comments:  This is patients graduation report. He has done well in rehab. He achieved a peak METS of 3.77. His resting HR was 99 and his resting BP was 150/78, His peak HR was 110 and peak BP was 158/80. A call will be made to patient  At 1 month, 6 month and 1 year to monitor progress.

## 2013-05-23 ENCOUNTER — Emergency Department (HOSPITAL_COMMUNITY): Payer: Non-veteran care

## 2013-05-23 ENCOUNTER — Emergency Department (HOSPITAL_COMMUNITY)
Admission: EM | Admit: 2013-05-23 | Discharge: 2013-05-24 | Disposition: A | Discharge status: Home / Self Care | Payer: Non-veteran care | Attending: Emergency Medicine | Admitting: Emergency Medicine

## 2013-05-23 ENCOUNTER — Encounter (HOSPITAL_COMMUNITY): Payer: Self-pay | Admitting: Emergency Medicine

## 2013-05-23 DIAGNOSIS — Z7982 Long term (current) use of aspirin: Secondary | ICD-10-CM | POA: Insufficient documentation

## 2013-05-23 DIAGNOSIS — F3289 Other specified depressive episodes: Secondary | ICD-10-CM | POA: Insufficient documentation

## 2013-05-23 DIAGNOSIS — R0682 Tachypnea, not elsewhere classified: Secondary | ICD-10-CM | POA: Insufficient documentation

## 2013-05-23 DIAGNOSIS — R0609 Other forms of dyspnea: Secondary | ICD-10-CM | POA: Insufficient documentation

## 2013-05-23 DIAGNOSIS — I10 Essential (primary) hypertension: Secondary | ICD-10-CM | POA: Insufficient documentation

## 2013-05-23 DIAGNOSIS — J449 Chronic obstructive pulmonary disease, unspecified: Secondary | ICD-10-CM | POA: Insufficient documentation

## 2013-05-23 DIAGNOSIS — R0600 Dyspnea, unspecified: Secondary | ICD-10-CM

## 2013-05-23 DIAGNOSIS — F17200 Nicotine dependence, unspecified, uncomplicated: Secondary | ICD-10-CM | POA: Insufficient documentation

## 2013-05-23 DIAGNOSIS — R0989 Other specified symptoms and signs involving the circulatory and respiratory systems: Principal | ICD-10-CM | POA: Insufficient documentation

## 2013-05-23 DIAGNOSIS — J4489 Other specified chronic obstructive pulmonary disease: Secondary | ICD-10-CM | POA: Insufficient documentation

## 2013-05-23 DIAGNOSIS — Z79899 Other long term (current) drug therapy: Secondary | ICD-10-CM | POA: Insufficient documentation

## 2013-05-23 DIAGNOSIS — F329 Major depressive disorder, single episode, unspecified: Secondary | ICD-10-CM | POA: Insufficient documentation

## 2013-05-23 HISTORY — DX: Post-traumatic stress disorder, unspecified: F43.10

## 2013-05-23 HISTORY — DX: Depression, unspecified: F32.A

## 2013-05-23 HISTORY — DX: Major depressive disorder, single episode, unspecified: F32.9

## 2013-05-23 HISTORY — DX: Essential (primary) hypertension: I10

## 2013-05-23 HISTORY — DX: Chronic obstructive pulmonary disease, unspecified: J44.9

## 2013-05-23 MED ORDER — PREDNISONE 50 MG PO TABS
60.0000 mg | ORAL_TABLET | ORAL | Status: AC
  Administered 2013-05-23: 60 mg via ORAL
  Filled 2013-05-23 (×2): qty 1

## 2013-05-23 MED ORDER — IPRATROPIUM-ALBUTEROL 0.5-2.5 (3) MG/3ML IN SOLN
3.0000 mL | Freq: Once | RESPIRATORY_TRACT | Status: AC
  Administered 2013-05-23: 3 mL via RESPIRATORY_TRACT
  Filled 2013-05-23: qty 3

## 2013-05-23 NOTE — ED Notes (Signed)
Pt with COPD. C/O cough, congestion,and wheezes.

## 2013-05-23 NOTE — ED Provider Notes (Signed)
CSN: [id number]     Arrival date & time 05/23/13  1919 History  This chart was scribed for Mark Munchobert Adams Hinch, MD by Beverly MilchJ Harrison Collins, ED Scribe. This patient was seen in room APA14/APA14 and the patient's care was started at 11:23 PM.     Chief Complaint  Patient presents with  . Cough  . Shortness of Breath      Patient is a 68 y.o. male presenting with shortness of breath. The history is provided by the patient. No language interpreter was used.  Shortness of Breath Associated symptoms: no vomiting    HPI Comments: HPI Comments: Mark Schroeder is a 68 y.o. male with a h/o COPD, PTSD, and seasonal allergies who presents to the Emergency Department complaining of a gradually worsening cough productive of green sputum that began about a week ago. He states he has associated chest tightness and congestion. He characterizes his current symptoms similar to a previous episode of complications with his COPD for which he took Tamiflu and prednisone. For this current flare up, he states he has used Robitussen, albuterol, symbicort, and nebulizer treatments with minimal relief.       Past Medical History  Diagnosis Date  . COPD (chronic obstructive pulmonary disease)   . Hypertension   . PTSD (post-traumatic stress disorder)   . Depression    Past Surgical History  Procedure Laterality Date  . 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 Titanium rod in left leg from knee to ankle Left 03/2011   History reviewed. No pertinent family history. History  Substance Use Topics  . Smoking status: Current Every Day Smoker -- 0.25 packs/day for 35 years    Types: Cigarettes  . Smokeless tobacco: Not on file  . Alcohol Use: No    Review of Systems  Constitutional:       Per HPI, otherwise negative  HENT:       Per HPI, otherwise negative  Respiratory: Positive for shortness of breath.        Per HPI, otherwise negative  Cardiovascular:       Per HPI, otherwise negative   Gastrointestinal: Negative for vomiting.  Endocrine:       Negative aside from HPI  Genitourinary:       Neg aside from HPI   Musculoskeletal:       Per HPI, otherwise negative  Skin: Negative.   Neurological: Negative for syncope.      Allergies  Review of patient's allergies indicates no known allergies.  Home Medications   Current Outpatient Rx  Name  Route  Sig  Dispense  Refill  . albuterol (PROVENTIL HFA;VENTOLIN HFA) 108 (90 BASE) MCG/ACT inhaler   Inhalation   Inhale 2 puffs into the lungs 4 (four) times daily.         Marland Kitchen. aspirin EC 81 MG tablet   Oral   Take 81 mg by mouth daily.         . budesonide-formoterol (SYMBICORT) 160-4.5 MCG/ACT inhaler   Inhalation   Inhale 2 puffs into the lungs 2 (two) times daily.         Marland Kitchen. CARVEDILOL PO   Oral   Take 1 tablet by mouth 2 (two) times daily.         . Cholecalciferol (VITAMIN D PO)   Oral   Take 1 capsule by mouth daily.         Marland Kitchen. DOXEPIN HCL PO   Oral   Take 3 capsules by mouth  at bedtime.         Marland Kitchen GABAPENTIN PO   Oral   Take 1 capsule by mouth 3 (three) times daily. Takes three capsules at bedtime in addition to 1 capsule three times daily         . hydrochlorothiazide (HYDRODIURIL) 25 MG tablet   Oral   Take 25 mg by mouth daily.         Marland Kitchen loratadine (CLARITIN) 10 MG tablet   Oral   Take 10 mg by mouth daily.         . pantoprazole (PROTONIX) 40 MG tablet   Oral   Take 40 mg by mouth daily.         . QUEtiapine Fumarate (SEROQUEL PO)   Oral   Take 0.5 tablets by mouth at bedtime.         Marland Kitchen SIMVASTATIN PO   Oral   Take 1 tablet by mouth daily.         Marland Kitchen tiotropium (SPIRIVA) 18 MCG inhalation capsule   Inhalation   Place 18 mcg into inhaler and inhale daily.          Triage Vitals: BP 128/80  Pulse 96  Temp(Src) 98.2 F (36.8 C) (Oral)  Resp 26  Ht 5\' 3"  (1.6 m)  Wt 165 lb (74.844 kg)  BMI 29.24 kg/m2  SpO2 95%  Physical Exam  Nursing note and vitals  reviewed. Constitutional: He is oriented to person, place, and time. He appears well-developed. No distress.  HENT:  Head: Normocephalic and atraumatic.  Eyes: Conjunctivae and EOM are normal.  Cardiovascular: Normal rate and regular rhythm.   Pulmonary/Chest: No stridor. Tachypnea noted. No respiratory distress. He has no wheezes.  Quiet breath sounds  Abdominal: He exhibits no distension.  Musculoskeletal: He exhibits no edema.  Neurological: He is alert and oriented to person, place, and time.  Skin: Skin is warm and dry.  Psychiatric: He has a normal mood and affect.    ED Course  Procedures (including critical care time)  DIAGNOSTIC STUDIES: Oxygen Saturation is 95% on RA, adequate by my interpretation.    COORDINATION OF CARE: 11:26 PM- Pt's parents advised of plan for treatment. Parents verbalize understanding and agreement with plan.     Imaging Review Dg Chest 2 View  05/23/2013   CLINICAL DATA:  Cough, congestion and wheezing  EXAM: CHEST  2 VIEW  COMPARISON:  None.  FINDINGS: Cardiac and mediastinal contours are within normal limits. The lungs are hyperexpanded. There are central bronchitic changes and diffuse mild interstitial prominence which appear chronic. Negative for edema, focal airspace consolidation, pleural effusion or pneumothorax. No suspicious pulmonary nodule or mass. The No acute osseous abnormality.  IMPRESSION: 1. No acute cardiopulmonary disease. 2. Background changes suggest COPD.   Electronically Signed   By: Malachy Moan M.D.   On: 05/23/2013 22:55    MDM  I personally performed the services described in this documentation, which was scribed in my presence. The recorded information has been reviewed and is accurate.  Patient presents with ongoing dyspnea.  On exam patient is awake, alert, and in no distress.  He is mildly tachypneic, but otherwise unremarkable on physical exam. Patient's x-rays unremarkable, his hypoxic.  When he was discharged  in stable condition with treatment for COPD exacerbation.  Patient has primary care followup capacity.   Mark Munch, MD 05/24/13 [phone]

## 2013-05-24 MED ORDER — PREDNISONE 20 MG PO TABS: 60.0000 mg | ORAL_TABLET | Freq: Every day | ORAL | Status: AC

## 2013-05-24 NOTE — Discharge Instructions (Signed)
As discussed, your evaluation today has been largely reassuring.  But, it is important that you monitor your condition carefully, and do not hesitate to return to the ED if you develop new, or concerning changes in your condition. ? ?Otherwise, please follow-up with your physician for appropriate ongoing care. ? ?

## 2014-06-18 DIAGNOSIS — J449 Chronic obstructive pulmonary disease, unspecified: Secondary | ICD-10-CM | POA: Diagnosis not present

## 2014-06-18 DIAGNOSIS — I10 Essential (primary) hypertension: Secondary | ICD-10-CM | POA: Diagnosis not present

## 2014-06-18 DIAGNOSIS — F329 Major depressive disorder, single episode, unspecified: Secondary | ICD-10-CM | POA: Diagnosis not present

## 2014-06-18 DIAGNOSIS — M1990 Unspecified osteoarthritis, unspecified site: Secondary | ICD-10-CM | POA: Diagnosis not present

## 2014-06-18 DIAGNOSIS — R739 Hyperglycemia, unspecified: Secondary | ICD-10-CM | POA: Diagnosis not present

## 2014-07-03 DIAGNOSIS — E119 Type 2 diabetes mellitus without complications: Secondary | ICD-10-CM | POA: Diagnosis not present

## 2014-07-03 DIAGNOSIS — I10 Essential (primary) hypertension: Secondary | ICD-10-CM | POA: Diagnosis not present

## 2014-07-03 DIAGNOSIS — M1990 Unspecified osteoarthritis, unspecified site: Secondary | ICD-10-CM | POA: Diagnosis not present

## 2014-07-03 DIAGNOSIS — J449 Chronic obstructive pulmonary disease, unspecified: Secondary | ICD-10-CM | POA: Diagnosis not present

## 2014-08-15 ENCOUNTER — Encounter (HOSPITAL_COMMUNITY): Payer: Self-pay | Admitting: Emergency Medicine

## 2014-08-15 ENCOUNTER — Inpatient Hospital Stay (HOSPITAL_COMMUNITY)
Admission: EM | Admit: 2014-08-15 | Discharge: 2014-08-19 | DRG: 194 | Disposition: A | Discharge status: Home / Self Care | Payer: Medicare Other | Attending: Internal Medicine | Admitting: Internal Medicine

## 2014-08-15 ENCOUNTER — Emergency Department (HOSPITAL_COMMUNITY): Payer: Medicare Other

## 2014-08-15 DIAGNOSIS — E861 Hypovolemia: Secondary | ICD-10-CM | POA: Diagnosis not present

## 2014-08-15 DIAGNOSIS — J189 Pneumonia, unspecified organism: Secondary | ICD-10-CM | POA: Diagnosis not present

## 2014-08-15 DIAGNOSIS — I10 Essential (primary) hypertension: Secondary | ICD-10-CM | POA: Diagnosis present

## 2014-08-15 DIAGNOSIS — Z87891 Personal history of nicotine dependence: Secondary | ICD-10-CM | POA: Diagnosis not present

## 2014-08-15 DIAGNOSIS — J441 Chronic obstructive pulmonary disease with (acute) exacerbation: Secondary | ICD-10-CM | POA: Diagnosis not present

## 2014-08-15 DIAGNOSIS — N179 Acute kidney failure, unspecified: Secondary | ICD-10-CM | POA: Diagnosis present

## 2014-08-15 DIAGNOSIS — E1165 Type 2 diabetes mellitus with hyperglycemia: Secondary | ICD-10-CM | POA: Diagnosis not present

## 2014-08-15 DIAGNOSIS — D649 Anemia, unspecified: Secondary | ICD-10-CM | POA: Diagnosis not present

## 2014-08-15 DIAGNOSIS — F4310 Post-traumatic stress disorder, unspecified: Secondary | ICD-10-CM | POA: Diagnosis present

## 2014-08-15 DIAGNOSIS — J449 Chronic obstructive pulmonary disease, unspecified: Secondary | ICD-10-CM | POA: Diagnosis not present

## 2014-08-15 DIAGNOSIS — E119 Type 2 diabetes mellitus without complications: Secondary | ICD-10-CM | POA: Diagnosis not present

## 2014-08-15 DIAGNOSIS — E876 Hypokalemia: Secondary | ICD-10-CM | POA: Diagnosis not present

## 2014-08-15 DIAGNOSIS — R0602 Shortness of breath: Secondary | ICD-10-CM | POA: Diagnosis not present

## 2014-08-15 DIAGNOSIS — R531 Weakness: Secondary | ICD-10-CM | POA: Diagnosis not present

## 2014-08-15 DIAGNOSIS — R509 Fever, unspecified: Secondary | ICD-10-CM

## 2014-08-15 DIAGNOSIS — Z8249 Family history of ischemic heart disease and other diseases of the circulatory system: Secondary | ICD-10-CM | POA: Diagnosis not present

## 2014-08-15 HISTORY — DX: Type 2 diabetes mellitus without complications: E11.9

## 2014-08-15 LAB — GLUCOSE, CAPILLARY
GLUCOSE-CAPILLARY: 166 mg/dL — AB (ref 65–99)
Glucose-Capillary: 121 mg/dL — ABNORMAL HIGH (ref 65–99)

## 2014-08-15 LAB — CBC WITH DIFFERENTIAL/PLATELET
Basophils Absolute: 0 10*3/uL (ref 0.0–0.1)
Basophils Relative: 0 % (ref 0–1)
Eosinophils Absolute: 0.1 10*3/uL (ref 0.0–0.7)
Eosinophils Relative: 1 % (ref 0–5)
HCT: 34.5 % — ABNORMAL LOW (ref 39.0–52.0)
Hemoglobin: 11.5 g/dL — ABNORMAL LOW (ref 13.0–17.0)
Lymphocytes Relative: 9 % — ABNORMAL LOW (ref 12–46)
Lymphs Abs: 1.1 10*3/uL (ref 0.7–4.0)
MCH: 30.1 pg (ref 26.0–34.0)
MCHC: 33.3 g/dL (ref 30.0–36.0)
MCV: 90.3 fL (ref 78.0–100.0)
Monocytes Absolute: 0.9 10*3/uL (ref 0.1–1.0)
Monocytes Relative: 7 % (ref 3–12)
Neutro Abs: 9.8 10*3/uL — ABNORMAL HIGH (ref 1.7–7.7)
Neutrophils Relative %: 83 % — ABNORMAL HIGH (ref 43–77)
Platelets: 308 10*3/uL (ref 150–400)
RBC: 3.82 MIL/uL — ABNORMAL LOW (ref 4.22–5.81)
RDW: 13.3 % (ref 11.5–15.5)
WBC: 11.9 10*3/uL — ABNORMAL HIGH (ref 4.0–10.5)

## 2014-08-15 LAB — URINALYSIS, ROUTINE W REFLEX MICROSCOPIC
Bilirubin Urine: NEGATIVE
Glucose, UA: 100 mg/dL — AB
Ketones, ur: NEGATIVE mg/dL
Leukocytes, UA: NEGATIVE
Nitrite: NEGATIVE
Protein, ur: 30 mg/dL — AB
Specific Gravity, Urine: 1.02 (ref 1.005–1.030)
Urobilinogen, UA: >8 mg/dL — ABNORMAL HIGH (ref 0.0–1.0)
pH: 6.5 (ref 5.0–8.0)

## 2014-08-15 LAB — BASIC METABOLIC PANEL
Anion gap: 13 (ref 5–15)
BUN: 17 mg/dL (ref 6–20)
CO2: 30 mmol/L (ref 22–32)
Calcium: 8.9 mg/dL (ref 8.9–10.3)
Chloride: 92 mmol/L — ABNORMAL LOW (ref 101–111)
Creatinine, Ser: 1.52 mg/dL — ABNORMAL HIGH (ref 0.61–1.24)
GFR calc Af Amer: 53 mL/min — ABNORMAL LOW (ref >60)
GFR calc non Af Amer: 46 mL/min — ABNORMAL LOW (ref >60)
Glucose, Bld: 129 mg/dL — ABNORMAL HIGH (ref 65–99)
Potassium: 2.9 mmol/L — ABNORMAL LOW (ref 3.5–5.1)
Sodium: 135 mmol/L (ref 135–145)

## 2014-08-15 LAB — URINE MICROSCOPIC-ADD ON

## 2014-08-15 LAB — MAGNESIUM: Magnesium: 2.2 mg/dL (ref 1.7–2.4)

## 2014-08-15 LAB — LACTIC ACID, PLASMA
Lactic Acid, Venous: 1.9 mmol/L (ref 0.5–2.0)
Lactic Acid, Venous: 1.2 mmol/L (ref 0.5–2.0)

## 2014-08-15 MED ORDER — INSULIN ASPART 100 UNIT/ML ~~LOC~~ SOLN: 0.0000 [IU] | Freq: Every day | SUBCUTANEOUS | Status: DC

## 2014-08-15 MED ORDER — BUDESONIDE-FORMOTEROL FUMARATE 160-4.5 MCG/ACT IN AERO
2.0000 | INHALATION_SPRAY | Freq: Two times a day (BID) | RESPIRATORY_TRACT | Status: DC
  Administered 2014-08-15 – 2014-08-19 (×8): 2 via RESPIRATORY_TRACT
  Filled 2014-08-15: qty 6

## 2014-08-15 MED ORDER — ACETAMINOPHEN 325 MG PO TABS
650.0000 mg | ORAL_TABLET | Freq: Four times a day (QID) | ORAL | Status: DC | PRN
  Filled 2014-08-15: qty 2

## 2014-08-15 MED ORDER — POTASSIUM CHLORIDE CRYS ER 20 MEQ PO TBCR
30.0000 meq | EXTENDED_RELEASE_TABLET | Freq: Two times a day (BID) | ORAL | Status: DC
  Administered 2014-08-15: 30 meq via ORAL
  Filled 2014-08-15 (×2): qty 1

## 2014-08-15 MED ORDER — POTASSIUM CHLORIDE CRYS ER 20 MEQ PO TBCR
60.0000 meq | EXTENDED_RELEASE_TABLET | Freq: Once | ORAL | Status: AC
  Administered 2014-08-15: 60 meq via ORAL
  Filled 2014-08-15: qty 3

## 2014-08-15 MED ORDER — ALBUTEROL SULFATE (2.5 MG/3ML) 0.083% IN NEBU: 2.5000 mg | INHALATION_SOLUTION | RESPIRATORY_TRACT | Status: DC | PRN

## 2014-08-15 MED ORDER — ASPIRIN EC 81 MG PO TBEC
81.0000 mg | DELAYED_RELEASE_TABLET | Freq: Every day | ORAL | Status: DC
  Administered 2014-08-15 – 2014-08-18 (×4): 81 mg via ORAL
  Filled 2014-08-15 (×5): qty 1

## 2014-08-15 MED ORDER — DEXTROSE 5 % IV SOLN
1.0000 g | Freq: Once | INTRAVENOUS | Status: AC
  Administered 2014-08-15: 1 g via INTRAVENOUS
  Filled 2014-08-15: qty 10

## 2014-08-15 MED ORDER — PANTOPRAZOLE SODIUM 40 MG PO TBEC
40.0000 mg | DELAYED_RELEASE_TABLET | Freq: Every day | ORAL | Status: DC
  Administered 2014-08-15 – 2014-08-18 (×4): 40 mg via ORAL
  Filled 2014-08-15 (×5): qty 1

## 2014-08-15 MED ORDER — CETYLPYRIDINIUM CHLORIDE 0.05 % MT LIQD
7.0000 mL | Freq: Two times a day (BID) | OROMUCOSAL | Status: DC
  Administered 2014-08-15 – 2014-08-18 (×7): 7 mL via OROMUCOSAL

## 2014-08-15 MED ORDER — LORATADINE 10 MG PO TABS
10.0000 mg | ORAL_TABLET | Freq: Every day | ORAL | Status: DC
  Administered 2014-08-15 – 2014-08-18 (×4): 10 mg via ORAL
  Filled 2014-08-15 (×5): qty 1

## 2014-08-15 MED ORDER — ACETAMINOPHEN 500 MG PO TABS
1000.0000 mg | ORAL_TABLET | Freq: Once | ORAL | Status: AC
  Administered 2014-08-15: 1000 mg via ORAL
  Filled 2014-08-15: qty 2

## 2014-08-15 MED ORDER — INSULIN ASPART 100 UNIT/ML ~~LOC~~ SOLN
0.0000 [IU] | Freq: Three times a day (TID) | SUBCUTANEOUS | Status: DC
  Administered 2014-08-15: 1 [IU] via SUBCUTANEOUS
  Administered 2014-08-16: 2 [IU] via SUBCUTANEOUS
  Administered 2014-08-18: 1 [IU] via SUBCUTANEOUS

## 2014-08-15 MED ORDER — PREDNISONE 20 MG PO TABS
40.0000 mg | ORAL_TABLET | Freq: Once | ORAL | Status: AC
  Administered 2014-08-15: 40 mg via ORAL
  Filled 2014-08-15: qty 2

## 2014-08-15 MED ORDER — SODIUM CHLORIDE 0.9 % IV BOLUS (SEPSIS)
1000.0000 mL | Freq: Once | INTRAVENOUS | Status: AC
  Administered 2014-08-15: 1000 mL via INTRAVENOUS

## 2014-08-15 MED ORDER — ONDANSETRON HCL 4 MG/2ML IJ SOLN: 4.0000 mg | Freq: Four times a day (QID) | INTRAMUSCULAR | Status: DC | PRN

## 2014-08-15 MED ORDER — POTASSIUM CHLORIDE IN NACL 20-0.9 MEQ/L-% IV SOLN
INTRAVENOUS | Status: DC
  Administered 2014-08-15 – 2014-08-16 (×3): via INTRAVENOUS

## 2014-08-15 MED ORDER — SENNOSIDES-DOCUSATE SODIUM 8.6-50 MG PO TABS
1.0000 | ORAL_TABLET | Freq: Every day | ORAL | Status: DC
  Administered 2014-08-15 – 2014-08-18 (×2): 1 via ORAL
  Filled 2014-08-15 (×4): qty 1

## 2014-08-15 MED ORDER — ENSURE ENLIVE PO LIQD
237.0000 mL | Freq: Two times a day (BID) | ORAL | Status: DC
  Administered 2014-08-15 – 2014-08-16 (×3): 237 mL via ORAL

## 2014-08-15 MED ORDER — ENOXAPARIN SODIUM 40 MG/0.4ML ~~LOC~~ SOLN
40.0000 mg | SUBCUTANEOUS | Status: DC
  Administered 2014-08-15 – 2014-08-18 (×4): 40 mg via SUBCUTANEOUS
  Filled 2014-08-15 (×4): qty 0.4

## 2014-08-15 MED ORDER — DEXTROSE 5 % IV SOLN
500.0000 mg | INTRAVENOUS | Status: DC
  Administered 2014-08-15 – 2014-08-16 (×2): 500 mg via INTRAVENOUS
  Filled 2014-08-15 (×5): qty 500

## 2014-08-15 MED ORDER — IPRATROPIUM-ALBUTEROL 0.5-2.5 (3) MG/3ML IN SOLN
3.0000 mL | Freq: Four times a day (QID) | RESPIRATORY_TRACT | Status: DC
  Administered 2014-08-15 – 2014-08-16 (×2): 3 mL via RESPIRATORY_TRACT
  Filled 2014-08-15 (×2): qty 3

## 2014-08-15 MED ORDER — ONDANSETRON HCL 4 MG PO TABS: 4.0000 mg | ORAL_TABLET | Freq: Four times a day (QID) | ORAL | Status: DC | PRN

## 2014-08-15 MED ORDER — DEXTROSE 5 % IV SOLN
1.0000 g | INTRAVENOUS | Status: DC
  Administered 2014-08-16 – 2014-08-18 (×3): 1 g via INTRAVENOUS
  Filled 2014-08-15 (×5): qty 10

## 2014-08-15 MED ORDER — IPRATROPIUM-ALBUTEROL 0.5-2.5 (3) MG/3ML IN SOLN
3.0000 mL | Freq: Once | RESPIRATORY_TRACT | Status: AC
  Administered 2014-08-15: 3 mL via RESPIRATORY_TRACT
  Filled 2014-08-15: qty 3

## 2014-08-15 MED ORDER — ACETAMINOPHEN 650 MG RE SUPP: 650.0000 mg | Freq: Four times a day (QID) | RECTAL | Status: DC | PRN

## 2014-08-15 MED ORDER — PREDNISONE 20 MG PO TABS
60.0000 mg | ORAL_TABLET | Freq: Every day | ORAL | Status: DC
  Administered 2014-08-16 – 2014-08-18 (×3): 60 mg via ORAL
  Filled 2014-08-15 (×3): qty 3

## 2014-08-15 NOTE — ED Notes (Signed)
RT at bedside.

## 2014-08-15 NOTE — ED Notes (Signed)
Pt c/o generalized weakness/fever/sob/dizziness x 1 week.

## 2014-08-15 NOTE — ED Notes (Signed)
2nd IV site started in [facility] to infuse fluids; site in RAC positional.

## 2014-08-15 NOTE — ED Notes (Signed)
Pt placed on 2L 02 via [place] 

## 2014-08-15 NOTE — ED Provider Notes (Signed)
CSN: [id number]     Arrival date & time 08/15/14  1104 History  This chart was scribed for Mark Razor, MD by Tanda Rockers, ED Scribe. This patient was seen in room APA14/APA14 and the patient's care was started at 11:29 AM.  Chief Complaint  Patient presents with  . Fatigue   The history is provided by the patient. No language interpreter was used.    HPI Comments: Mark Schroeder is a 69 y.o. male with hx COPD and HTN who presents to the Emergency Department complaining of generalized weakness, increased shortness of breath, dry cough, headache, and fever x 4 days. Wife mentions that pt has albuterol inhaler at home but has not been able to use them due to feeling so weak. Wife reports that pt has also been urinating on himself for the past couple of days but mentions that she believes it is because he could not make it to the restroom in time. Denies leg swelling, chest pain, nausea, vomiting, diarrhea, dysuria, difficulty urinating, or any other symptoms. No recent sick contact with similar symptoms.   Past Medical History  Diagnosis Date  . COPD (chronic obstructive pulmonary disease)   . Hypertension   . PTSD (post-traumatic stress disorder)   . Depression    Past Surgical History  Procedure Laterality Date  . 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 Titanium rod in left leg from knee to ankle Left 03/2011   History reviewed. No pertinent family history. History  Substance Use Topics  . Smoking status: Former Smoker -- 0.25 packs/day for 35 years    Types: Cigarettes  . Smokeless tobacco: Not on file  . Alcohol Use: No    Review of Systems  Constitutional: Positive for fever and fatigue.  Respiratory: Positive for cough and shortness of breath.   Cardiovascular: Negative for chest pain and leg swelling.  Gastrointestinal: Negative for nausea and vomiting.  Genitourinary: Negative for dysuria and difficulty urinating.  Neurological: Positive for headaches.  All  other systems reviewed and are negative.  Allergies  Review of patient's allergies indicates no known allergies.  Home Medications   Prior to Admission medications   Medication Sig Start Date End Date Taking? Authorizing Provider  albuterol (PROVENTIL HFA;VENTOLIN HFA) 108 (90 BASE) MCG/ACT inhaler Inhale 2 puffs into the lungs 4 (four) times daily.    Carles Collet, MD  aspirin EC 81 MG tablet Take 81 mg by mouth daily.    Historical Provider, MD  budesonide-formoterol (SYMBICORT) 160-4.5 MCG/ACT inhaler Inhale 2 puffs into the lungs 2 (two) times daily.    Historical Provider, MD  CARVEDILOL PO Take 1 tablet by mouth 2 (two) times daily.    Historical Provider, MD  Cholecalciferol (VITAMIN D PO) Take 1 capsule by mouth daily.    Historical Provider, MD  DOXEPIN HCL PO Take 3 capsules by mouth at bedtime.    Historical Provider, MD  GABAPENTIN PO Take 1 capsule by mouth 3 (three) times daily. Takes three capsules at bedtime in addition to 1 capsule three times daily    Historical Provider, MD  hydrochlorothiazide (HYDRODIURIL) 25 MG tablet Take 25 mg by mouth daily.    Historical Provider, MD  loratadine (CLARITIN) 10 MG tablet Take 10 mg by mouth daily.    Historical Provider, MD  pantoprazole (PROTONIX) 40 MG tablet Take 40 mg by mouth daily.    Historical Provider, MD  QUEtiapine Fumarate (SEROQUEL PO) Take 0.5 tablets by mouth at bedtime.  Historical Provider, MD  SIMVASTATIN PO Take 1 tablet by mouth daily.    Historical Provider, MD  tiotropium (SPIRIVA) 18 MCG inhalation capsule Place 18 mcg into inhaler and inhale daily.    Historical Provider, MD   Triage Vitals: BP 108/75 mmHg  Pulse 92  Temp(Src) 101.8 F (38.8 C) (Rectal)  Resp 14  Ht 5\' 3"  (1.6 m)  Wt 165 lb (74.844 kg)  BMI 29.24 kg/m2  SpO2 94%   Physical Exam  Constitutional: He is oriented to person, place, and time. He appears well-developed and well-nourished. No distress.  Tired appearing.   HENT:  Head:  Normocephalic and atraumatic.  Eyes: Conjunctivae and EOM are normal.  Neck: Neck supple. No tracheal deviation present.  Cardiovascular: Normal rate.   Pulmonary/Chest: Effort normal. No respiratory distress. He has wheezes.  Faint expiratory wheezing bilaterally.    Musculoskeletal: Normal range of motion. He exhibits no edema.  No LE edema. R foot amputation.   Neurological: He is alert and oriented to person, place, and time.  Skin: Skin is warm and dry.  Psychiatric: He has a normal mood and affect. His behavior is normal.  Nursing note and vitals reviewed.   ED Course  Procedures (including critical care time)  DIAGNOSTIC STUDIES: Oxygen Saturation is 94% on RA, adequate by my interpretation.    COORDINATION OF CARE: 11:34 AM-Discussed treatment plan which includes CXR, UA, CBC, BMP, Lactic acid with pt at bedside and pt agreed to plan.   Labs Review Labs Reviewed  CBC WITH DIFFERENTIAL/PLATELET - Abnormal; Notable for the following:    WBC 11.9 (*)    RBC 3.82 (*)    Hemoglobin 11.5 (*)    HCT 34.5 (*)    Neutrophils Relative % 83 (*)    Neutro Abs 9.8 (*)    Lymphocytes Relative 9 (*)    All other components within normal limits  BASIC METABOLIC PANEL - Abnormal; Notable for the following:    Potassium 2.9 (*)    Chloride 92 (*)    Glucose, Bld 129 (*)    Creatinine, Ser 1.52 (*)    GFR calc non Af Amer 46 (*)    GFR calc Af Amer 53 (*)    All other components within normal limits  LACTIC ACID, PLASMA  URINALYSIS, ROUTINE W REFLEX MICROSCOPIC (NOT AT [facility])  LACTIC ACID, PLASMA    Imaging Review Dg Chest 2 View  08/15/2014   CLINICAL DATA:  Fever, shortness of breath, COPD, former smoker  EXAM: CHEST  2 VIEW  COMPARISON:  05/23/2013  FINDINGS: Normal heart size, mediastinal contours and pulmonary vascularity.  RIGHT upper lobe infiltrate consistent with pneumonia.  Underlying bronchitic and emphysematous changes suggesting COPD.  No pleural effusion or  pneumothorax.  Bones unremarkable.  IMPRESSION: Probable COPD changes with RIGHT upper lobe infiltrate consistent with pneumonia.   Electronically Signed   By: Ulyses Southward M.D.   On: 08/15/2014 12:41     EKG Interpretation   Date/Time:  Thursday August 15 2014 11:27:04 EDT Ventricular Rate:  93 PR Interval:  163 QRS Duration: 90 QT Interval:  386 QTC Calculation: 480 R Axis:   72 Text Interpretation:  Sinus rhythm Consider left atrial enlargement  Borderline prolonged QT interval ED PHYSICIAN INTERPRETATION AVAILABLE IN  [facility] Confirmed by TEST, Record ([id number]) on 08/16/2014 7:40:16 AM      MDM   Final diagnoses:  Fever  CAP (community acquired pneumonia)  Hypokalemia   69 year old male with generalized weakness,  fever and shortness of breath. Progressively worsening over the past several days. Febrile to 101.8 in the emergency room. Hypoxic on room air. He has a history of COPD but is not on oxygen therapy. CXR with RUL infiltrate consistent with pneumonia. Abx. Admission.    Mark Razor, MD 08/17/14 [phone]

## 2014-08-15 NOTE — ED Notes (Signed)
Rt called

## 2014-08-15 NOTE — Progress Notes (Signed)
Notified [facility] MD, pt's wife bought pt's medication orders from his primary physician to the hospital. Pt states he needs to take his PTSD medications. The medication order is in pt's chart. Awaiting response from MD.

## 2014-08-15 NOTE — H&P (Signed)
[facility] History and Physical  Mark Schroeder ZOX:[id number] DOB: [birth year] DOA: 08/15/2014  Referring physician: ED physician, Dr. Juleen China. PCP: Avon Gully, MD   Chief Complaint: Shortness of breath, generalized weakness, fatigue.  HPI: Mark Schroeder is a 69 y.o. male with a history of COPD, hypertension, PTSD, "borderline " diabetes mellitus, who presents to the emergency department with a four-day history of shortness of breath, generalized weakness, and fatigue. He also complains of a nonproductive cough and has had chills and a fever as high as 103.4 at home. He has had mild to moderate wheezing, but his wife states that he wheezes a little all the time. He has had some urinary incontinence. He has had some mild dizziness. He denies nausea, vomiting, diarrhea, chest pain, pleurisy, abdominal pain, or pain with urination. In the ED, he was febrile with a temperature 101.8, otherwise hemodynamically stable. His oxygen saturation range from 88% to 99%. His chest x-ray revealed COPD changes with right upper lobe pneumonia. His lab data were significant for potassium of 2.9, WBC of 11.9, normal lactic acid, hemoglobin of 11.5, and a creatinine of 1.52. He is being admitted for further evaluation and management.     Review of Systems:  Positive as above in history present illness. In addition, he has neuropathic pain in his legs. Otherwise, review of systems is negative.  Past Medical History  Diagnosis Date  . COPD (chronic obstructive pulmonary disease)   . Hypertension   . PTSD (post-traumatic stress disorder)   . Depression   . Type 2 diabetes mellitus    Past Surgical History  Procedure Laterality Date  . 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 Titanium rod in left leg from knee to ankle Left 03/2011   Social History:He is married. He has 4 children. He is retired. He still drives. He stopped smoking 6 months ago. He denies alcohol  and illicit drug use.  r  No Known Allergies  Family history: The patient's mother died of a heart attack. His father died of complications of cirrhosis.  Prior to Admission medications   Medication Sig Start Date End Date Taking? Authorizing Provider  albuterol (PROVENTIL HFA;VENTOLIN HFA) 108 (90 BASE) MCG/ACT inhaler Inhale 2 puffs into the lungs 4 (four) times daily.   Yes Carles Collet, MD  aspirin EC 81 MG tablet Take 81 mg by mouth daily.   Yes Historical Provider, MD  budesonide-formoterol (SYMBICORT) 160-4.5 MCG/ACT inhaler Inhale 2 puffs into the lungs 2 (two) times daily.   Yes Historical Provider, MD  CARVEDILOL PO Take 1 tablet by mouth 2 (two) times daily.   Yes Historical Provider, MD  cetirizine (ZYRTEC ALLERGY) 10 MG tablet Take 10 mg by mouth daily.   Yes Historical Provider, MD  Cholecalciferol (VITAMIN D PO) Take 1 capsule by mouth daily.   Yes Historical Provider, MD  DOXEPIN HCL PO Take 3 capsules by mouth at bedtime.   Yes Historical Provider, MD  GABAPENTIN PO Take 1 capsule by mouth 3 (three) times daily. Takes three capsules at bedtime in addition to 1 capsule three times daily   Yes Historical Provider, MD  hydrochlorothiazide (HYDRODIURIL) 25 MG tablet Take 25 mg by mouth daily.   Yes Historical Provider, MD  pantoprazole (PROTONIX) 40 MG tablet Take 40 mg by mouth daily.   Yes Historical Provider, MD  QUEtiapine Fumarate (SEROQUEL PO) Take 0.5 tablets by mouth at bedtime.   Yes Historical Provider, MD  SIMVASTATIN  PO Take 1 tablet by mouth daily.   Yes Historical Provider, MD  tiotropium (SPIRIVA) 18 MCG inhalation capsule Place 18 mcg into inhaler and inhale daily.   Yes Historical Provider, MD   Physical Exam: Filed Vitals:   08/15/14 1229 08/15/14 1300 08/15/14 1330 08/15/14 1420  BP:  103/84 103/70 100/67  Pulse:  91 87 86  Temp: 98 F (36.7 C)   98.6 F (37 C)  TempSrc: Oral   Oral  Resp:  18 17 16   Height:    5\' 4"  (1.626 m)  Weight:    73.029 kg  (161 lb)  SpO2:  98% 97% 98%    Wt Readings from Last 3 Encounters:  08/15/14 73.029 kg (161 lb)  05/23/13 74.844 kg (165 lb)  11/21/12 72.621 kg (160 lb 1.6 oz)    General:  Appears calm and comfortable; Pleasant 69 year old African-American man in no acute distress.  Eyes: PERRL, normal lids, irises & conjunctiva; Conjunctivae are clear and sclerae are white.  ENT: grossly normal hearing; oropharynx reveals a partial denture, mildly dry mucous membranes.  Neck: no LAD, masses or thyromegaly Cardiovascular:S1, S2, no murmurs rubs or gallops.  Telemetry: SR, no arrhythmias  Respiratory: Mild diffuse wheezes bilaterally with occasional crackles in the right upper lobe. Breathing is nonlabored.  Abdomen: Positive bowel sounds, soft, nontender, nondistended.  Skin: no rash or induration seen on limited exam Musculoskeletal: Right lower extremity BKA, intact, no edema; left lower extremity without edema and palpable pedal pulses. No acute hot red joints.  Psychiatric: grossly normal mood and affect, speech fluent and appropriate Neurologic: grossly non-focal; cranial nerves II through XII are intact.          Labs on Admission:  Basic Metabolic Panel:  Recent Labs Lab 08/15/14 1136  NA 135  K 2.9*  CL 92*  CO2 30  GLUCOSE 129*  BUN 17  CREATININE 1.52*  CALCIUM 8.9  MG 2.2   Liver Function Tests: No results for input(s): AST, ALT, ALKPHOS, BILITOT, PROT, ALBUMIN in the last 168 hours. No results for input(s): LIPASE, AMYLASE in the last 168 hours. No results for input(s): AMMONIA in the last 168 hours. CBC:  Recent Labs Lab 08/15/14 1136  WBC 11.9*  NEUTROABS 9.8*  HGB 11.5*  HCT 34.5*  MCV 90.3  PLT 308   Cardiac Enzymes: No results for input(s): CKTOTAL, CKMB, CKMBINDEX, TROPONINI in the last 168 hours.  BNP (last 3 results) No results for input(s): BNP in the last 8760 hours.  ProBNP (last 3 results) No results for input(s): PROBNP in the last 8760  hours.  CBG: No results for input(s): GLUCAP in the last 168 hours.  Radiological Exams on Admission: Dg Chest 2 View  08/15/2014   CLINICAL DATA:  Fever, shortness of breath, COPD, former smoker  EXAM: CHEST  2 VIEW  COMPARISON:  05/23/2013  FINDINGS: Normal heart size, mediastinal contours and pulmonary vascularity.  RIGHT upper lobe infiltrate consistent with pneumonia.  Underlying bronchitic and emphysematous changes suggesting COPD.  No pleural effusion or pneumothorax.  Bones unremarkable.  IMPRESSION: Probable COPD changes with RIGHT upper lobe infiltrate consistent with pneumonia.   Electronically Signed   By: Ulyses Southward M.D.   On: 08/15/2014 12:41    EKG: Independently reviewed.   Assessment/Plan Principal Problem:   CAP (community acquired pneumonia) Active Problems:   Hypokalemia   Acute kidney injury   Type 2 diabetes mellitus without complication   COPD with exacerbation   Normocytic anemia  1. Community-acquired pneumonia. The patient was given Rocephin and azithromycin in the emergency department. These will be continued. Of note, he was febrile, but blood cultures were not ordered. Because he has already had IV antibiotics, will hold off on ordering blood cultures until he becomes febrile again. 2. COPD with mild exacerbation. Patient does have mild diffuse wheezes on exam. He does not have a history of oxygen dependence. He received 40 mg of prednisone in the ED. We'll continue prednisone at 60 mg daily. We will add DuoNeb every 6 hours and albuterol neb every 2 hours as needed. Will restart Symbicort. The patient stopped smoking 6 months ago. 3. Acute kidney injury. The patient's creatinine is 1.52. His baseline creatinine is unknown, but this is presumed prerenal azotemia from his pneumonia and hypovolemia. Will continue IV fluid hydration and monitor his renal function.  4. Hypovolemia. The patient has a history of essential hypertension, but his blood pressures are on  the low normal side, likely secondary to hypovolemia in the setting of pneumonia and fever. Will hold his antihypertensives medications and continue to hydrate. 5. Hypokalemia. Etiology is unknown at this time. His serum potassium was 2.9 on admission. Magnesium level was checked and it was within normal limits. He was given 60 mEq of KCl in the ED. We'll continue to replete orally as needed and in the IV fluids. 6. "Borderline" diabetes mellitus per history. Will treat with sliding scale NovoLog. The patient is unsure about the name of his oral medication to treat diabetes. 7. Normocytic anemia. The patient's hemoglobin is 11.5. Will order of a few anemia studies for evaluation. 8. Patient's home medication list has not been updated. It will be reviewed for additions to the order set.    Code Status: Full code  DVT Prophylaxis: Lovenox  Family Communication: discussed with wife  Disposition Plan: discharge when clinically appropriate   Time spent: one hour   [facility] Pager [phone]

## 2014-08-16 LAB — BASIC METABOLIC PANEL
Anion gap: 10 (ref 5–15)
BUN: 13 mg/dL (ref 6–20)
CO2: 28 mmol/L (ref 22–32)
Calcium: 8.2 mg/dL — ABNORMAL LOW (ref 8.9–10.3)
Chloride: 100 mmol/L — ABNORMAL LOW (ref 101–111)
Creatinine, Ser: 0.9 mg/dL (ref 0.61–1.24)
Glucose, Bld: 147 mg/dL — ABNORMAL HIGH (ref 65–99)
Potassium: 3.9 mmol/L (ref 3.5–5.1)
Sodium: 138 mmol/L (ref 135–145)

## 2014-08-16 LAB — IRON AND TIBC
Iron: 18 ug/dL — ABNORMAL LOW (ref 45–182)
Saturation Ratios: 7 % — ABNORMAL LOW (ref 17.9–39.5)
TIBC: 270 ug/dL (ref 250–450)
UIBC: 252 ug/dL

## 2014-08-16 LAB — HEMOGLOBIN A1C
Hgb A1c MFr Bld: 6.7 % — ABNORMAL HIGH (ref 4.8–5.6)
Mean Plasma Glucose: 146 mg/dL

## 2014-08-16 LAB — CBC
HCT: 31.9 % — ABNORMAL LOW (ref 39.0–52.0)
Hemoglobin: 10.5 g/dL — ABNORMAL LOW (ref 13.0–17.0)
MCH: 30.2 pg (ref 26.0–34.0)
MCHC: 32.9 g/dL (ref 30.0–36.0)
MCV: 91.7 fL (ref 78.0–100.0)
PLATELETS: 335 10*3/uL (ref 150–400)
RBC: 3.48 MIL/uL — AB (ref 4.22–5.81)
RDW: 13.4 % (ref 11.5–15.5)
WBC: 13.2 10*3/uL — AB (ref 4.0–10.5)

## 2014-08-16 LAB — FERRITIN: Ferritin: 170 ng/mL (ref 24–336)

## 2014-08-16 LAB — GLUCOSE, CAPILLARY
GLUCOSE-CAPILLARY: 114 mg/dL — AB (ref 65–99)
Glucose-Capillary: 113 mg/dL — ABNORMAL HIGH (ref 65–99)
Glucose-Capillary: 197 mg/dL — ABNORMAL HIGH (ref 65–99)
Glucose-Capillary: 146 mg/dL — ABNORMAL HIGH (ref 65–99)

## 2014-08-16 LAB — VITAMIN B12: Vitamin B-12: 1429 pg/mL — ABNORMAL HIGH (ref 180–914)

## 2014-08-16 MED ORDER — IPRATROPIUM-ALBUTEROL 0.5-2.5 (3) MG/3ML IN SOLN
3.0000 mL | Freq: Three times a day (TID) | RESPIRATORY_TRACT | Status: DC
  Administered 2014-08-16 – 2014-08-19 (×8): 3 mL via RESPIRATORY_TRACT
  Filled 2014-08-16 (×8): qty 3

## 2014-08-16 MED ORDER — IPRATROPIUM-ALBUTEROL 0.5-2.5 (3) MG/3ML IN SOLN
3.0000 mL | Freq: Four times a day (QID) | RESPIRATORY_TRACT | Status: DC
  Administered 2014-08-16 (×2): 3 mL via RESPIRATORY_TRACT
  Filled 2014-08-16 (×2): qty 3

## 2014-08-16 NOTE — Care Management Note (Signed)
Case Management Note  Patient Details  Name: Mark Schroeder MRN: [id number] Date of Birth: December 21, 1945  Expected Discharge Date:                  Expected Discharge Plan:  Home/Self Care  In-House Referral:  NA  Discharge planning Services  CM Consult  Post Acute Care Choice:  NA Choice offered to:  NA  DME Arranged:    DME Agency:     HH Arranged:    HH Agency:     Status of Service:  Completed, signed off  Medicare Important Message Given:  Yes Date Medicare IM Given:  08/16/14 Medicare IM give by:  Kathyrn Sheriff, RN, MSN, CM  Date Additional Medicare IM Given:    Additional Medicare Important Message give by:     If discussed at Long Length of Stay Meetings, dates discussed:    Additional Comments: Pt is from home, lives with wife and is independent at baseline. Pt has neb machine but no other DME's or HH services prior to admission. Pt says he is active at the [facility] but also has [employer] states he does not want to transfer because he is already being treated and he doesn't want his wife to have to drive to [facility] by herself. Pt and CM spoke with Selinda Michaels, of [place]. CM has faxed the signed refusal to transfer form and H & P to [place] at the [place]. Pt plans to DC home, possibly over weekend. No CM needs anticipated. CM to fax DC summary to [facility] at DC.  Malcolm Metro, RN 08/16/2014, 9:32 AM

## 2014-08-16 NOTE — Progress Notes (Signed)
Initial Nutrition Assessment  DOCUMENTATION CODES: Not applicable  INTERVENTION: Gave some education on a diabetic diet  Monitor oral intake and again offer snacks/supplements as warranted  NUTRITION DIAGNOSIS: Food and nutrition related knowledge deficit related to Diabetes and Carb control diet as evidenced by pt's misinformation surrounding the diet  GOAL: Patient will meet greater than or equal to 90% of their needs  MONITOR: PO intake, Labs, Skin, I & O's  REASON FOR ASSESSMENT: Malnutrition Screening Tool    ASSESSMENT: 69 y.o. male with a history of COPD, hypertension, BKA, PTSD, "borderline " DM, who presents to ED with a four-day history of SOB, generalized weakness, and fatigue. Admitted and treated for CAP.  Pt states that d/t his acute illness he has been eating slightly less than usual. However, as of now, his major complaint is severe indigestion. He states he also has been coughing up bits of his meals and that is why he hadnt eaten well today  He states he usually weighs between 155-170 lbs. He thinks he has lost a little bit since he first got pneumonia. No wt records to confirm this loss.  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 He said he somewhat follows a diabetic diet. However, upon discussion it seemed like he wasn't exactly sure what foods influenced his blood sugars. He reports drinking sugars sweetened beverages and eating a lot of snack foods. We discussed what food groups will influence his blood sugars and he should try to eat 3 meals a day. Ideally ach should contain some carbs, protein, and fiber. I advised him to cut back on the SSB.  At this time pt does not want any supplements/snacks d/t severe indigestion and because he wants to lose weight (I told him this wasn't the best way to do it)  Height: Ht Readings from Last 1 Encounters:  08/15/14 5\' 4"  (1.626 m)    Weight: Wt Readings from Last 1 Encounters:  08/16/14  161 lb 1.6 oz (73.074 kg)    Ideal Body Weight:  55.3 kg  Wt Readings from Last 10 Encounters:  08/16/14 161 lb 1.6 oz (73.074 kg)  05/23/13 165 lb (74.844 kg)  11/21/12 160 lb 1.6 oz (72.621 kg)  Admit weight: 165 lbs  BMI:  Body mass index is 27.64 kg/(m^2).  Adjusted BMI: 29.5  Estimated Nutritional Needs: Kcal:  1600-1750 (22-24 kcal/kg) Protein:  55-65 (1-1.2 g/kg IBW) Fluid:  1.6-1.8 liters  Skin:  Wound (see comment)  Diet Order:  Diet Carb Modified Fluid consistency:: Thin; Room service appropriate?: Yes  EDUCATION NEEDS: Education needs addressed   Intake/Output Summary (Last 24 hours) at 08/16/14 1611 Last data filed at 08/16/14 1400  Gross per 24 hour  Intake 2238.34 ml  Output   1150 ml  Net 1088.34 ml   Last BM:  6/24  Christophe Louis RD, LDN Nutrition Pager: [phone] 08/16/2014 4:11 PM

## 2014-08-16 NOTE — Progress Notes (Signed)
Subjective: Patient was admitted yesterday due to cough, shortness of breath and wheezing as case of pneumonia and COPD. He feels  better today. He is on iv antibiotics and oral steroid.  Objective: Vital signs in last 24 hours: Temp:  [97.7 F (36.5 C)-101.8 F (38.8 C)] 97.8 F (36.6 C) (06/24 0620) Pulse Rate:  [84-95] 86 (06/24 0620) Resp:  [14-21] 16 (06/24 0620) BP: (100-139)/(60-84) 139/68 mmHg (06/24 0620) SpO2:  [88 %-100 %] 98 % (06/24 0714) Weight:  [73.029 kg (161 lb)-74.844 kg (165 lb)] 73.074 kg (161 lb 1.6 oz) (06/24 6606) Weight change:  Last BM Date: 08/15/14  Intake/Output from previous day: 06/23 0701 - 06/24 0700 In: 2198.3 [P.O.:480; I.V.:1468.3; IV Piggyback:250] Out: 450 [Urine:450]  PHYSICAL EXAM General appearance: alert and no distress Resp: diminished breath sounds bilaterally and rales bilaterally Cardio: S1, S2 normal GI: soft, non-tender; bowel sounds normal; no masses,  no organomegaly Extremities: s/p rt BKA  Lab Results:  Results for orders placed or performed during the hospital encounter of 08/15/14 (from the past 48 hour(s))  Urinalysis, Routine w reflex microscopic (not at [facility])     Status: Abnormal   Collection Time: 08/15/14 11:34 AM  Result Value Ref Range   Color, Urine YELLOW YELLOW   APPearance CLEAR CLEAR   Specific Gravity, Urine 1.020 1.005 - 1.030   pH 6.5 5.0 - 8.0   Glucose, UA 100 (A) NEGATIVE mg/dL   Hgb urine dipstick MODERATE (A) NEGATIVE   Bilirubin Urine NEGATIVE NEGATIVE   Ketones, ur NEGATIVE NEGATIVE mg/dL   Protein, ur 30 (A) NEGATIVE mg/dL   Urobilinogen, UA >8.0 (H) 0.0 - 1.0 mg/dL   Nitrite NEGATIVE NEGATIVE   Leukocytes, UA NEGATIVE NEGATIVE  Urine microscopic-add on     Status: Abnormal   Collection Time: 08/15/14 11:34 AM  Result Value Ref Range   Squamous Epithelial / LPF MANY (A) RARE   WBC, UA 0-2 <3 WBC/hpf   RBC / HPF 11-20 <3 RBC/hpf   Bacteria, UA RARE RARE   Casts HYALINE CASTS (A) NEGATIVE   CBC with Differential     Status: Abnormal   Collection Time: 08/15/14 11:36 AM  Result Value Ref Range   WBC 11.9 (H) 4.0 - 10.5 K/uL   RBC 3.82 (L) 4.22 - 5.81 MIL/uL   Hemoglobin 11.5 (L) 13.0 - 17.0 g/dL   HCT 34.5 (L) 39.0 - 52.0 %   MCV 90.3 78.0 - 100.0 fL   MCH 30.1 26.0 - 34.0 pg   MCHC 33.3 30.0 - 36.0 g/dL   RDW 13.3 11.5 - 15.5 %   Platelets 308 150 - 400 K/uL   Neutrophils Relative % 83 (H) 43 - 77 %   Neutro Abs 9.8 (H) 1.7 - 7.7 K/uL   Lymphocytes Relative 9 (L) 12 - 46 %   Lymphs Abs 1.1 0.7 - 4.0 K/uL   Monocytes Relative 7 3 - 12 %   Monocytes Absolute 0.9 0.1 - 1.0 K/uL   Eosinophils Relative 1 0 - 5 %   Eosinophils Absolute 0.1 0.0 - 0.7 K/uL   Basophils Relative 0 0 - 1 %   Basophils Absolute 0.0 0.0 - 0.1 K/uL  Basic metabolic panel     Status: Abnormal   Collection Time: 08/15/14 11:36 AM  Result Value Ref Range   Sodium 135 135 - 145 mmol/L   Potassium 2.9 (L) 3.5 - 5.1 mmol/L   Chloride 92 (L) 101 - 111 mmol/L   CO2 30 22 -  32 mmol/L   Glucose, Bld 129 (H) 65 - 99 mg/dL   BUN 17 6 - 20 mg/dL   Creatinine, Ser 1.52 (H) 0.61 - 1.24 mg/dL   Calcium 8.9 8.9 - 10.3 mg/dL   GFR calc non Af Amer 46 (L) >60 mL/min   GFR calc Af Amer 53 (L) >60 mL/min    Comment: (NOTE) The eGFR has been calculated using the CKD EPI equation. This calculation has not been validated in all clinical situations. eGFR's persistently <60 mL/min signify possible Chronic Kidney Disease.    Anion gap 13 5 - 15  Lactic acid, plasma     Status: None   Collection Time: 08/15/14 11:36 AM  Result Value Ref Range   Lactic Acid, Venous 1.9 0.5 - 2.0 mmol/L  Magnesium     Status: None   Collection Time: 08/15/14 11:36 AM  Result Value Ref Range   Magnesium 2.2 1.7 - 2.4 mg/dL  Hemoglobin A1c     Status: Abnormal   Collection Time: 08/15/14 11:36 AM  Result Value Ref Range   Hgb A1c MFr Bld 6.7 (H) 4.8 - 5.6 %    Comment: (NOTE)         Pre-diabetes: 5.7 - 6.4          Diabetes: >6.4         Glycemic control for adults with diabetes: <7.0    Mean Plasma Glucose 146 mg/dL    Comment: (NOTE) Performed At: [facility] [place], [place] [id number] Lindon Romp MD [id number]   Lactic acid, plasma     Status: None   Collection Time: 08/15/14  2:25 PM  Result Value Ref Range   Lactic Acid, Venous 1.2 0.5 - 2.0 mmol/L  Glucose, capillary     Status: Abnormal   Collection Time: 08/15/14  4:40 PM  Result Value Ref Range   Glucose-Capillary 121 (H) 65 - 99 mg/dL   Comment 1 Notify RN   Glucose, capillary     Status: Abnormal   Collection Time: 08/15/14 10:04 PM  Result Value Ref Range   Glucose-Capillary 166 (H) 65 - 99 mg/dL   Comment 1 Notify RN    Comment 2 Document in Chart   CBC     Status: Abnormal   Collection Time: 08/16/14  5:16 AM  Result Value Ref Range   WBC 13.2 (H) 4.0 - 10.5 K/uL   RBC 3.48 (L) 4.22 - 5.81 MIL/uL   Hemoglobin 10.5 (L) 13.0 - 17.0 g/dL   HCT 31.9 (L) 39.0 - 52.0 %   MCV 91.7 78.0 - 100.0 fL   MCH 30.2 26.0 - 34.0 pg   MCHC 32.9 30.0 - 36.0 g/dL   RDW 13.4 11.5 - 15.5 %   Platelets 335 150 - 400 K/uL  Basic metabolic panel     Status: Abnormal   Collection Time: 08/16/14  5:16 AM  Result Value Ref Range   Sodium 138 135 - 145 mmol/L   Potassium 3.9 3.5 - 5.1 mmol/L    Comment: DELTA CHECK NOTED   Chloride 100 (L) 101 - 111 mmol/L   CO2 28 22 - 32 mmol/L   Glucose, Bld 147 (H) 65 - 99 mg/dL   BUN 13 6 - 20 mg/dL   Creatinine, Ser 0.90 0.61 - 1.24 mg/dL   Calcium 8.2 (L) 8.9 - 10.3 mg/dL   GFR calc non Af Amer >60 >60 mL/min   GFR calc Af Amer >60 >60 mL/min  Comment: (NOTE) The eGFR has been calculated using the CKD EPI equation. This calculation has not been validated in all clinical situations. eGFR's persistently <60 mL/min signify possible Chronic Kidney Disease.    Anion gap 10 5 - 15  Glucose, capillary     Status: Abnormal   Collection Time: 08/16/14  7:39 AM  Result  Value Ref Range   Glucose-Capillary 114 (H) 65 - 99 mg/dL    ABGS No results for input(s): PHART, PO2ART, TCO2, HCO3 in the last 72 hours.  Invalid input(s): PCO2 CULTURES No results found for this or any previous visit (from the past 240 hour(s)). Studies/Results: Dg Chest 2 View  08/15/2014   CLINICAL DATA:  Fever, shortness of breath, COPD, former smoker  EXAM: CHEST  2 VIEW  COMPARISON:  05/23/2013  FINDINGS: Normal heart size, mediastinal contours and pulmonary vascularity.  RIGHT upper lobe infiltrate consistent with pneumonia.  Underlying bronchitic and emphysematous changes suggesting COPD.  No pleural effusion or pneumothorax.  Bones unremarkable.  IMPRESSION: Probable COPD changes with RIGHT upper lobe infiltrate consistent with pneumonia.   Electronically Signed   By: Lavonia Dana M.D.   On: 08/15/2014 12:41    Medications: I have reviewed the patient's current medications.  Assesment:   Principal Problem:   CAP (community acquired pneumonia) Active Problems:   COPD with exacerbation   Hypokalemia   Acute kidney injury   Normocytic anemia   Type 2 diabetes mellitus without complication    Plan:  Medications reviewed Continue iv antibiotics Will decrease iv fluid Will continue oral steroid and nebulizer treatment.    LOS: 1 day   Spyros Winch 08/16/2014, 7:58 AM

## 2014-08-17 LAB — BASIC METABOLIC PANEL
ANION GAP: 9 (ref 5–15)
BUN: 11 mg/dL (ref 6–20)
CHLORIDE: 102 mmol/L (ref 101–111)
CO2: 27 mmol/L (ref 22–32)
Calcium: 8.5 mg/dL — ABNORMAL LOW (ref 8.9–10.3)
Creatinine, Ser: 0.79 mg/dL (ref 0.61–1.24)
GFR calc non Af Amer: >60 mL/min (ref >60)
Glucose, Bld: 136 mg/dL — ABNORMAL HIGH (ref 65–99)
POTASSIUM: 3.7 mmol/L (ref 3.5–5.1)
SODIUM: 138 mmol/L (ref 135–145)

## 2014-08-17 LAB — CBC
HCT: 31.9 % — ABNORMAL LOW (ref 39.0–52.0)
Hemoglobin: 10.5 g/dL — ABNORMAL LOW (ref 13.0–17.0)
MCH: 29.6 pg (ref 26.0–34.0)
MCHC: 32.9 g/dL (ref 30.0–36.0)
MCV: 89.9 fL (ref 78.0–100.0)
PLATELETS: 395 10*3/uL (ref 150–400)
RBC: 3.55 MIL/uL — AB (ref 4.22–5.81)
RDW: 13.5 % (ref 11.5–15.5)
WBC: 16.6 10*3/uL — ABNORMAL HIGH (ref 4.0–10.5)

## 2014-08-17 LAB — GLUCOSE, CAPILLARY
GLUCOSE-CAPILLARY: 97 mg/dL (ref 65–99)
GLUCOSE-CAPILLARY: 98 mg/dL (ref 65–99)
Glucose-Capillary: 110 mg/dL — ABNORMAL HIGH (ref 65–99)
Glucose-Capillary: 177 mg/dL — ABNORMAL HIGH (ref 65–99)

## 2014-08-17 MED ORDER — DOXEPIN HCL 25 MG PO CAPS
100.0000 mg | ORAL_CAPSULE | Freq: Every day | ORAL | Status: DC
  Administered 2014-08-17 – 2014-08-18 (×2): 100 mg via ORAL
  Filled 2014-08-17 (×2): qty 4

## 2014-08-17 MED ORDER — OXYCODONE-ACETAMINOPHEN 5-325 MG PO TABS: 1.0000 | ORAL_TABLET | Freq: Four times a day (QID) | ORAL | Status: DC | PRN

## 2014-08-17 MED ORDER — TRIAMTERENE-HCTZ 37.5-25 MG PO TABS
1.0000 | ORAL_TABLET | Freq: Every day | ORAL | Status: DC
  Administered 2014-08-17 – 2014-08-18 (×2): 1 via ORAL
  Filled 2014-08-17 (×3): qty 1

## 2014-08-17 MED ORDER — QUETIAPINE FUMARATE 25 MG PO TABS
50.0000 mg | ORAL_TABLET | Freq: Every day | ORAL | Status: DC
  Administered 2014-08-17: 50 mg via ORAL
  Filled 2014-08-17: qty 2

## 2014-08-17 MED ORDER — AZITHROMYCIN 250 MG PO TABS
500.0000 mg | ORAL_TABLET | Freq: Every day | ORAL | Status: DC
  Administered 2014-08-17 – 2014-08-18 (×2): 500 mg via ORAL
  Filled 2014-08-17 (×3): qty 2

## 2014-08-17 MED ORDER — CARVEDILOL 12.5 MG PO TABS
12.5000 mg | ORAL_TABLET | Freq: Two times a day (BID) | ORAL | Status: DC
  Administered 2014-08-17 – 2014-08-18 (×4): 12.5 mg via ORAL
  Filled 2014-08-17 (×5): qty 1

## 2014-08-17 MED ORDER — GABAPENTIN 300 MG PO CAPS
300.0000 mg | ORAL_CAPSULE | Freq: Three times a day (TID) | ORAL | Status: DC
  Administered 2014-08-17 – 2014-08-18 (×5): 300 mg via ORAL
  Filled 2014-08-17 (×6): qty 1

## 2014-08-17 MED ORDER — FLUTICASONE PROPIONATE 50 MCG/ACT NA SUSP
2.0000 | Freq: Every day | NASAL | Status: DC
  Administered 2014-08-17 – 2014-08-18 (×2): 2 via NASAL
  Filled 2014-08-17: qty 16

## 2014-08-17 MED ORDER — BUPROPION HCL ER (SR) 150 MG PO TB12
150.0000 mg | ORAL_TABLET | Freq: Two times a day (BID) | ORAL | Status: DC
  Administered 2014-08-17 – 2014-08-18 (×4): 150 mg via ORAL
  Filled 2014-08-17 (×5): qty 1

## 2014-08-17 MED ORDER — GABAPENTIN 300 MG PO CAPS: 300.0000 mg | ORAL_CAPSULE | Freq: Four times a day (QID) | ORAL | Status: DC

## 2014-08-17 MED ORDER — TERAZOSIN HCL 1 MG PO CAPS
4.0000 mg | ORAL_CAPSULE | Freq: Every day | ORAL | Status: DC
  Administered 2014-08-17 – 2014-08-18 (×2): 4 mg via ORAL
  Filled 2014-08-17 (×2): qty 4

## 2014-08-17 MED ORDER — GABAPENTIN 400 MG PO CAPS
1200.0000 mg | ORAL_CAPSULE | Freq: Every day | ORAL | Status: DC
  Administered 2014-08-17 – 2014-08-18 (×2): 1200 mg via ORAL
  Filled 2014-08-17 (×2): qty 3

## 2014-08-17 MED ORDER — CYCLOBENZAPRINE HCL 10 MG PO TABS: 10.0000 mg | ORAL_TABLET | Freq: Three times a day (TID) | ORAL | Status: DC | PRN

## 2014-08-17 MED ORDER — QUETIAPINE FUMARATE 100 MG PO TABS
100.0000 mg | ORAL_TABLET | Freq: Every day | ORAL | Status: DC
  Administered 2014-08-17 – 2014-08-18 (×2): 100 mg via ORAL
  Filled 2014-08-17 (×2): qty 1

## 2014-08-17 MED ORDER — POTASSIUM CHLORIDE CRYS ER 10 MEQ PO TBCR
10.0000 meq | EXTENDED_RELEASE_TABLET | Freq: Two times a day (BID) | ORAL | Status: DC
  Administered 2014-08-17 – 2014-08-18 (×4): 10 meq via ORAL
  Filled 2014-08-17 (×5): qty 1

## 2014-08-17 MED ORDER — ATORVASTATIN CALCIUM 40 MG PO TABS
40.0000 mg | ORAL_TABLET | Freq: Every day | ORAL | Status: DC
  Administered 2014-08-17 – 2014-08-18 (×2): 40 mg via ORAL
  Filled 2014-08-17 (×2): qty 1

## 2014-08-17 NOTE — Progress Notes (Signed)
PHARMACIST - PHYSICIAN COMMUNICATION DR:   Felecia Shelling CONCERNING: Antibiotic IV to Oral Route Change Policy  RECOMMENDATION: This patient is receiving Zithromax by the intravenous route.  Based on criteria approved by the Pharmacy and Therapeutics Committee, the antibiotic(s) is/are being converted to the equivalent oral dose form(s).   DESCRIPTION: These criteria include:  Patient being treated for a respiratory tract infection, urinary tract infection, cellulitis or clostridium difficile associated diarrhea if on metronidazole  The patient is not neutropenic and does not exhibit a GI malabsorption state  The patient is eating (either orally or via tube) and/or has been taking other orally administered medications for a least 24 hours  The patient is improving clinically and has a Tmax < 100.5  If you have questions about this conversion, please contact the Pharmacy Department  [x]   [phone] )  [facility] []   [phone] )  [facility] []   [phone] )  [facility] []   [phone] )  [facility] []   [phone] )  Ilene Qua   Junita Push, PharmD, BCPS 08/17/2014@10 :30 AM

## 2014-08-17 NOTE — Progress Notes (Signed)
Subjective: Patient feels better. His gradually improving. No new complaint.  Objective: Vital signs in last 24 hours: Temp:  [97.7 F (36.5 C)-98 F (36.7 C)] 97.7 F (36.5 C) (06/25 0529) Pulse Rate:  [103-113] 113 (06/25 0529) Resp:  [16-18] 18 (06/25 0529) BP: (148-156)/(72-88) 148/88 mmHg (06/25 0529) SpO2:  [95 %-100 %] 99 % (06/25 0708) Weight:  [73.074 kg (161 lb 1.6 oz)] 73.074 kg (161 lb 1.6 oz) (06/25 0529) Weight change: -1.769 kg (-3 lb 14.4 oz) Last BM Date: 08/16/14  Intake/Output from previous day: 06/24 0701 - 06/25 0700 In: 1982.5 [P.O.:650; I.V.:1332.5] Out: 2625 [Urine:2625]  PHYSICAL EXAM General appearance: alert and no distress Resp: diminished breath sounds bilaterally and rales bilaterally Cardio: S1, S2 normal GI: soft, non-tender; bowel sounds normal; no masses,  no organomegaly Extremities: s/p rt BKA  Lab Results:  Results for orders placed or performed during the hospital encounter of 08/15/14 (from the past 48 hour(s))  Urinalysis, Routine w reflex microscopic (not at [facility])     Status: Abnormal   Collection Time: 08/15/14 11:34 AM  Result Value Ref Range   Color, Urine YELLOW YELLOW   APPearance CLEAR CLEAR   Specific Gravity, Urine 1.020 1.005 - 1.030   pH 6.5 5.0 - 8.0   Glucose, UA 100 (A) NEGATIVE mg/dL   Hgb urine dipstick MODERATE (A) NEGATIVE   Bilirubin Urine NEGATIVE NEGATIVE   Ketones, ur NEGATIVE NEGATIVE mg/dL   Protein, ur 30 (A) NEGATIVE mg/dL   Urobilinogen, UA >8.0 (H) 0.0 - 1.0 mg/dL   Nitrite NEGATIVE NEGATIVE   Leukocytes, UA NEGATIVE NEGATIVE  Urine microscopic-add on     Status: Abnormal   Collection Time: 08/15/14 11:34 AM  Result Value Ref Range   Squamous Epithelial / LPF MANY (A) RARE   WBC, UA 0-2 <3 WBC/hpf   RBC / HPF 11-20 <3 RBC/hpf   Bacteria, UA RARE RARE   Casts HYALINE CASTS (A) NEGATIVE  CBC with Differential     Status: Abnormal   Collection Time: 08/15/14 11:36 AM  Result Value Ref Range   WBC 11.9 (H) 4.0 - 10.5 K/uL   RBC 3.82 (L) 4.22 - 5.81 MIL/uL   Hemoglobin 11.5 (L) 13.0 - 17.0 g/dL   HCT 34.5 (L) 39.0 - 52.0 %   MCV 90.3 78.0 - 100.0 fL   MCH 30.1 26.0 - 34.0 pg   MCHC 33.3 30.0 - 36.0 g/dL   RDW 13.3 11.5 - 15.5 %   Platelets 308 150 - 400 K/uL   Neutrophils Relative % 83 (H) 43 - 77 %   Neutro Abs 9.8 (H) 1.7 - 7.7 K/uL   Lymphocytes Relative 9 (L) 12 - 46 %   Lymphs Abs 1.1 0.7 - 4.0 K/uL   Monocytes Relative 7 3 - 12 %   Monocytes Absolute 0.9 0.1 - 1.0 K/uL   Eosinophils Relative 1 0 - 5 %   Eosinophils Absolute 0.1 0.0 - 0.7 K/uL   Basophils Relative 0 0 - 1 %   Basophils Absolute 0.0 0.0 - 0.1 K/uL  Basic metabolic panel     Status: Abnormal   Collection Time: 08/15/14 11:36 AM  Result Value Ref Range   Sodium 135 135 - 145 mmol/L   Potassium 2.9 (L) 3.5 - 5.1 mmol/L   Chloride 92 (L) 101 - 111 mmol/L   CO2 30 22 - 32 mmol/L   Glucose, Bld 129 (H) 65 - 99 mg/dL   BUN 17 6 - 20 mg/dL  Creatinine, Ser 1.52 (H) 0.61 - 1.24 mg/dL   Calcium 8.9 8.9 - 10.3 mg/dL   GFR calc non Af Amer 46 (L) >60 mL/min   GFR calc Af Amer 53 (L) >60 mL/min    Comment: (NOTE) The eGFR has been calculated using the CKD EPI equation. This calculation has not been validated in all clinical situations. eGFR's persistently <60 mL/min signify possible Chronic Kidney Disease.    Anion gap 13 5 - 15  Lactic acid, plasma     Status: None   Collection Time: 08/15/14 11:36 AM  Result Value Ref Range   Lactic Acid, Venous 1.9 0.5 - 2.0 mmol/L  Magnesium     Status: None   Collection Time: 08/15/14 11:36 AM  Result Value Ref Range   Magnesium 2.2 1.7 - 2.4 mg/dL  Hemoglobin A1c     Status: Abnormal   Collection Time: 08/15/14 11:36 AM  Result Value Ref Range   Hgb A1c MFr Bld 6.7 (H) 4.8 - 5.6 %    Comment: (NOTE)         Pre-diabetes: 5.7 - 6.4         Diabetes: >6.4         Glycemic control for adults with diabetes: <7.0    Mean Plasma Glucose 146 mg/dL    Comment:  (NOTE) Performed At: [facility] [place], [place] [id number] Lindon Romp MD [id number]   Lactic acid, plasma     Status: None   Collection Time: 08/15/14  2:25 PM  Result Value Ref Range   Lactic Acid, Venous 1.2 0.5 - 2.0 mmol/L  Glucose, capillary     Status: Abnormal   Collection Time: 08/15/14  4:40 PM  Result Value Ref Range   Glucose-Capillary 121 (H) 65 - 99 mg/dL   Comment 1 Notify RN   Glucose, capillary     Status: Abnormal   Collection Time: 08/15/14 10:04 PM  Result Value Ref Range   Glucose-Capillary 166 (H) 65 - 99 mg/dL   Comment 1 Notify RN    Comment 2 Document in Chart   CBC     Status: Abnormal   Collection Time: 08/16/14  5:16 AM  Result Value Ref Range   WBC 13.2 (H) 4.0 - 10.5 K/uL   RBC 3.48 (L) 4.22 - 5.81 MIL/uL   Hemoglobin 10.5 (L) 13.0 - 17.0 g/dL   HCT 31.9 (L) 39.0 - 52.0 %   MCV 91.7 78.0 - 100.0 fL   MCH 30.2 26.0 - 34.0 pg   MCHC 32.9 30.0 - 36.0 g/dL   RDW 13.4 11.5 - 15.5 %   Platelets 335 150 - 400 K/uL  Basic metabolic panel     Status: Abnormal   Collection Time: 08/16/14  5:16 AM  Result Value Ref Range   Sodium 138 135 - 145 mmol/L   Potassium 3.9 3.5 - 5.1 mmol/L    Comment: DELTA CHECK NOTED   Chloride 100 (L) 101 - 111 mmol/L   CO2 28 22 - 32 mmol/L   Glucose, Bld 147 (H) 65 - 99 mg/dL   BUN 13 6 - 20 mg/dL   Creatinine, Ser 0.90 0.61 - 1.24 mg/dL   Calcium 8.2 (L) 8.9 - 10.3 mg/dL   GFR calc non Af Amer >60 >60 mL/min   GFR calc Af Amer >60 >60 mL/min    Comment: (NOTE) The eGFR has been calculated using the CKD EPI equation. This calculation has not been validated in all  clinical situations. eGFR's persistently <60 mL/min signify possible Chronic Kidney Disease.    Anion gap 10 5 - 15  Ferritin     Status: None   Collection Time: 08/16/14  5:16 AM  Result Value Ref Range   Ferritin 170 24 - 336 ng/mL    Comment: Performed at [facility]  Iron and TIBC     Status: Abnormal    Collection Time: 08/16/14  5:16 AM  Result Value Ref Range   Iron 18 (L) 45 - 182 ug/dL   TIBC 270 250 - 450 ug/dL   Saturation Ratios 7 (L) 17.9 - 39.5 %   UIBC 252 ug/dL    Comment: Performed at [facility]  Vitamin B12     Status: Abnormal   Collection Time: 08/16/14  5:16 AM  Result Value Ref Range   Vitamin B-12 1429 (H) 180 - 914 pg/mL    Comment: (NOTE) This assay is not validated for testing neonatal or myeloproliferative syndrome specimens for Vitamin B12 levels. Performed at [facility]   Glucose, capillary     Status: Abnormal   Collection Time: 08/16/14  7:39 AM  Result Value Ref Range   Glucose-Capillary 114 (H) 65 - 99 mg/dL  Glucose, capillary     Status: Abnormal   Collection Time: 08/16/14 11:17 AM  Result Value Ref Range   Glucose-Capillary 113 (H) 65 - 99 mg/dL  Glucose, capillary     Status: Abnormal   Collection Time: 08/16/14  4:22 PM  Result Value Ref Range   Glucose-Capillary 197 (H) 65 - 99 mg/dL  Glucose, capillary     Status: Abnormal   Collection Time: 08/16/14  8:54 PM  Result Value Ref Range   Glucose-Capillary 146 (H) 65 - 99 mg/dL   Comment 1 Notify RN    Comment 2 Document in Chart   CBC     Status: Abnormal   Collection Time: 08/17/14  5:56 AM  Result Value Ref Range   WBC 16.6 (H) 4.0 - 10.5 K/uL   RBC 3.55 (L) 4.22 - 5.81 MIL/uL   Hemoglobin 10.5 (L) 13.0 - 17.0 g/dL   HCT 31.9 (L) 39.0 - 52.0 %   MCV 89.9 78.0 - 100.0 fL   MCH 29.6 26.0 - 34.0 pg   MCHC 32.9 30.0 - 36.0 g/dL   RDW 13.5 11.5 - 15.5 %   Platelets 395 150 - 400 K/uL  Basic metabolic panel     Status: Abnormal   Collection Time: 08/17/14  5:56 AM  Result Value Ref Range   Sodium 138 135 - 145 mmol/L   Potassium 3.7 3.5 - 5.1 mmol/L   Chloride 102 101 - 111 mmol/L   CO2 27 22 - 32 mmol/L   Glucose, Bld 136 (H) 65 - 99 mg/dL   BUN 11 6 - 20 mg/dL   Creatinine, Ser 0.79 0.61 - 1.24 mg/dL   Calcium 8.5 (L) 8.9 - 10.3 mg/dL   GFR calc non Af Amer >60  >60 mL/min   GFR calc Af Amer >60 >60 mL/min    Comment: (NOTE) The eGFR has been calculated using the CKD EPI equation. This calculation has not been validated in all clinical situations. eGFR's persistently <60 mL/min signify possible Chronic Kidney Disease.    Anion gap 9 5 - 15  Glucose, capillary     Status: Abnormal   Collection Time: 08/17/14  7:39 AM  Result Value Ref Range   Glucose-Capillary 110 (H) 65 -  99 mg/dL    ABGS No results for input(s): PHART, PO2ART, TCO2, HCO3 in the last 72 hours.  Invalid input(s): PCO2 CULTURES No results found for this or any previous visit (from the past 240 hour(s)). Studies/Results: Dg Chest 2 View  08/15/2014   CLINICAL DATA:  Fever, shortness of breath, COPD, former smoker  EXAM: CHEST  2 VIEW  COMPARISON:  05/23/2013  FINDINGS: Normal heart size, mediastinal contours and pulmonary vascularity.  RIGHT upper lobe infiltrate consistent with pneumonia.  Underlying bronchitic and emphysematous changes suggesting COPD.  No pleural effusion or pneumothorax.  Bones unremarkable.  IMPRESSION: Probable COPD changes with RIGHT upper lobe infiltrate consistent with pneumonia.   Electronically Signed   By: Lavonia Dana M.D.   On: 08/15/2014 12:41    Medications: I have reviewed the patient's current medications.  Assesment:   Principal Problem:   CAP (community acquired pneumonia) Active Problems:   COPD with exacerbation   Hypokalemia   Acute kidney injury   Normocytic anemia   Type 2 diabetes mellitus without complication    Plan:  Medications reviewed Continue iv antibiotics Will decrease iv fluid Will continue oral steroid and nebulizer treatment.    LOS: 2 days   Azhane Eckart 08/17/2014, 8:48 AM

## 2014-08-18 LAB — BASIC METABOLIC PANEL
ANION GAP: 9 (ref 5–15)
BUN: 15 mg/dL (ref 6–20)
CO2: 29 mmol/L (ref 22–32)
Calcium: 8.8 mg/dL — ABNORMAL LOW (ref 8.9–10.3)
Chloride: 102 mmol/L (ref 101–111)
Creatinine, Ser: 0.81 mg/dL (ref 0.61–1.24)
GFR calc non Af Amer: >60 mL/min (ref >60)
Glucose, Bld: 110 mg/dL — ABNORMAL HIGH (ref 65–99)
POTASSIUM: 3.7 mmol/L (ref 3.5–5.1)
Sodium: 140 mmol/L (ref 135–145)

## 2014-08-18 LAB — GLUCOSE, CAPILLARY
GLUCOSE-CAPILLARY: 95 mg/dL (ref 65–99)
Glucose-Capillary: 114 mg/dL — ABNORMAL HIGH (ref 65–99)
Glucose-Capillary: 135 mg/dL — ABNORMAL HIGH (ref 65–99)
Glucose-Capillary: 168 mg/dL — ABNORMAL HIGH (ref 65–99)

## 2014-08-18 MED ORDER — PREDNISONE 20 MG PO TABS
40.0000 mg | ORAL_TABLET | Freq: Every day | ORAL | Status: DC
  Filled 2014-08-18: qty 2

## 2014-08-18 NOTE — Progress Notes (Signed)
Subjective: Patient is resting. He is improving. No fever or chills.  Objective: Vital signs in last 24 hours: Temp:  [97.8 F (36.6 C)-98.1 F (36.7 C)] 97.9 F (36.6 C) (06/26 0508) Pulse Rate:  [93-101] 93 (06/26 0508) Resp:  [20] 20 (06/26 0508) BP: (106-150)/(75-82) 106/75 mmHg (06/26 0508) SpO2:  [93 %-100 %] 95 % (06/26 0845) Weight:  [72.485 kg (159 lb 12.8 oz)] 72.485 kg (159 lb 12.8 oz) (06/26 0508) Weight change: -0.59 kg (-1 lb 4.8 oz) Last BM Date: 08/17/14  Intake/Output from previous day: 06/25 0701 - 06/26 0700 In: 720 [P.O.:720] Out: 1250 [Urine:1250]  PHYSICAL EXAM General appearance: alert and no distress Resp: diminished breath sounds bilaterally and rales bilaterally Cardio: S1, S2 normal GI: soft, non-tender; bowel sounds normal; no masses,  no organomegaly Extremities: s/p rt BKA  Lab Results:  Results for orders placed or performed during the hospital encounter of 08/15/14 (from the past 48 hour(s))  Glucose, capillary     Status: Abnormal   Collection Time: 08/16/14 11:17 AM  Result Value Ref Range   Glucose-Capillary 113 (H) 65 - 99 mg/dL  Glucose, capillary     Status: Abnormal   Collection Time: 08/16/14  4:22 PM  Result Value Ref Range   Glucose-Capillary 197 (H) 65 - 99 mg/dL  Glucose, capillary     Status: Abnormal   Collection Time: 08/16/14  8:54 PM  Result Value Ref Range   Glucose-Capillary 146 (H) 65 - 99 mg/dL   Comment 1 Notify RN    Comment 2 Document in Chart   CBC     Status: Abnormal   Collection Time: 08/17/14  5:56 AM  Result Value Ref Range   WBC 16.6 (H) 4.0 - 10.5 K/uL   RBC 3.55 (L) 4.22 - 5.81 MIL/uL   Hemoglobin 10.5 (L) 13.0 - 17.0 g/dL   HCT 31.9 (L) 39.0 - 52.0 %   MCV 89.9 78.0 - 100.0 fL   MCH 29.6 26.0 - 34.0 pg   MCHC 32.9 30.0 - 36.0 g/dL   RDW 13.5 11.5 - 15.5 %   Platelets 395 150 - 400 K/uL  Basic metabolic panel     Status: Abnormal   Collection Time: 08/17/14  5:56 AM  Result Value Ref Range   Sodium 138 135 - 145 mmol/L   Potassium 3.7 3.5 - 5.1 mmol/L   Chloride 102 101 - 111 mmol/L   CO2 27 22 - 32 mmol/L   Glucose, Bld 136 (H) 65 - 99 mg/dL   BUN 11 6 - 20 mg/dL   Creatinine, Ser 0.79 0.61 - 1.24 mg/dL   Calcium 8.5 (L) 8.9 - 10.3 mg/dL   GFR calc non Af Amer >60 >60 mL/min   GFR calc Af Amer >60 >60 mL/min    Comment: (NOTE) The eGFR has been calculated using the CKD EPI equation. This calculation has not been validated in all clinical situations. eGFR's persistently <60 mL/min signify possible Chronic Kidney Disease.    Anion gap 9 5 - 15  Glucose, capillary     Status: Abnormal   Collection Time: 08/17/14  7:39 AM  Result Value Ref Range   Glucose-Capillary 110 (H) 65 - 99 mg/dL  Glucose, capillary     Status: None   Collection Time: 08/17/14 11:31 AM  Result Value Ref Range   Glucose-Capillary 97 65 - 99 mg/dL  Glucose, capillary     Status: None   Collection Time: 08/17/14  4:33 PM  Result Value  Ref Range   Glucose-Capillary 98 65 - 99 mg/dL  Glucose, capillary     Status: Abnormal   Collection Time: 08/17/14  8:34 PM  Result Value Ref Range   Glucose-Capillary 177 (H) 65 - 99 mg/dL   Comment 1 Notify RN    Comment 2 Document in Chart   Basic metabolic panel     Status: Abnormal   Collection Time: 08/18/14  5:55 AM  Result Value Ref Range   Sodium 140 135 - 145 mmol/L   Potassium 3.7 3.5 - 5.1 mmol/L   Chloride 102 101 - 111 mmol/L   CO2 29 22 - 32 mmol/L   Glucose, Bld 110 (H) 65 - 99 mg/dL   BUN 15 6 - 20 mg/dL   Creatinine, Ser 0.81 0.61 - 1.24 mg/dL   Calcium 8.8 (L) 8.9 - 10.3 mg/dL   GFR calc non Af Amer >60 >60 mL/min   GFR calc Af Amer >60 >60 mL/min    Comment: (NOTE) The eGFR has been calculated using the CKD EPI equation. This calculation has not been validated in all clinical situations. eGFR's persistently <60 mL/min signify possible Chronic Kidney Disease.    Anion gap 9 5 - 15  Glucose, capillary     Status: None   Collection  Time: 08/18/14  7:23 AM  Result Value Ref Range   Glucose-Capillary 95 65 - 99 mg/dL    ABGS No results for input(s): PHART, PO2ART, TCO2, HCO3 in the last 72 hours.  Invalid input(s): PCO2 CULTURES No results found for this or any previous visit (from the past 240 hour(s)). Studies/Results: No results found.  Medications: I have reviewed the patient's current medications.  Assesment:   Principal Problem:   CAP (community acquired pneumonia) Active Problems:   COPD with exacerbation   Hypokalemia   Acute kidney injury   Normocytic anemia   Type 2 diabetes mellitus without complication    Plan:  Medications reviewed Continue iv antibiotics Will continue oral steroid and nebulizer treatment.    LOS: 3 days   Mujahid Jalomo 08/18/2014, 9:03 AM

## 2014-08-19 LAB — GLUCOSE, CAPILLARY: GLUCOSE-CAPILLARY: 84 mg/dL (ref 65–99)

## 2014-08-19 LAB — BASIC METABOLIC PANEL
ANION GAP: 10 (ref 5–15)
BUN: 19 mg/dL (ref 6–20)
CALCIUM: 9.5 mg/dL (ref 8.9–10.3)
CO2: 31 mmol/L (ref 22–32)
CREATININE: 0.9 mg/dL (ref 0.61–1.24)
Chloride: 99 mmol/L — ABNORMAL LOW (ref 101–111)
GFR calc Af Amer: >60 mL/min (ref >60)
GFR calc non Af Amer: >60 mL/min (ref >60)
Glucose, Bld: 100 mg/dL — ABNORMAL HIGH (ref 65–99)
Potassium: 4.5 mmol/L (ref 3.5–5.1)
Sodium: 140 mmol/L (ref 135–145)

## 2014-08-19 MED ORDER — PREDNISONE 10 MG (21) PO TBPK: 10.0000 mg | ORAL_TABLET | Freq: Every day | ORAL | Status: DC

## 2014-08-19 MED ORDER — AMOXICILLIN-POT CLAVULANATE 500-125 MG PO TABS: 1.0000 | ORAL_TABLET | Freq: Three times a day (TID) | ORAL | Status: DC

## 2014-08-19 NOTE — Discharge Summary (Signed)
Physician Discharge Summary  Patient ID: Mark BillsFreddie L Schroeder MRN: [id number] DOB/AGE: 68/06/1945 68 y.o. Primary Care Physician:Jase Himmelberger, MD Admit date: 08/15/2014 Discharge date: 08/19/2014    Discharge Diagnoses:     Principal Problem:   CAP (community acquired pneumonia) Active Problems:   COPD with exacerbation   Hypokalemia   Acute kidney injury   Normocytic anemia   Type 2 diabetes mellitus without complication     Medication List    TAKE these medications        albuterol (2.5 MG/3ML) 0.083% nebulizer solution  Commonly known as:  PROVENTIL  Take 2.5 mg by nebulization 3 (three) times daily as needed for wheezing or shortness of breath.     albuterol 108 (90 BASE) MCG/ACT inhaler  Commonly known as:  PROVENTIL HFA;VENTOLIN HFA  Inhale 2 puffs into the lungs 4 (four) times daily.     amoxicillin-clavulanate 500-125 MG per tablet  Commonly known as:  AUGMENTIN  Take 1 tablet (500 mg total) by mouth 3 (three) times daily.     aspirin EC 81 MG tablet  Take 81 mg by mouth daily.     budesonide-formoterol 160-4.5 MCG/ACT inhaler  Commonly known as:  SYMBICORT  Inhale 2 puffs into the lungs 2 (two) times daily.     buPROPion 150 MG 12 hr tablet  Commonly known as:  ZYBAN  Take 150 mg by mouth 2 (two) times daily.     carvedilol 12.5 MG tablet  Commonly known as:  COREG  Take 12.5 mg by mouth every 12 (twelve) hours.     cyclobenzaprine 10 MG tablet  Commonly known as:  FLEXERIL  Take 10 mg by mouth every 8 (eight) hours as needed for muscle spasms.     doxepin 50 MG capsule  Commonly known as:  SINEQUAN  Take 100 mg by mouth at bedtime.     fluticasone 50 MCG/ACT nasal spray  Commonly known as:  FLONASE  Place 2 sprays into both nostrils at bedtime.     gabapentin 300 MG capsule  Commonly known as:  NEURONTIN  Take 300-1,200 mg by mouth 4 (four) times daily. 1 capsule four times daily and 3 capsules at bedtime.     guaiFENesin 100 MG/5ML Soln   Commonly known as:  ROBITUSSIN  Take 5 mLs by mouth 4 (four) times daily as needed for cough or to loosen phlegm.     oxyCODONE-acetaminophen 5-325 MG per tablet  Commonly known as:  PERCOCET/ROXICET  Take 1 tablet by mouth every 6 (six) hours as needed for moderate pain or severe pain.     pantoprazole 40 MG tablet  Commonly known as:  PROTONIX  Take 40 mg by mouth 2 (two) times daily.     predniSONE 10 MG (21) Tbpk tablet  Commonly known as:  STERAPRED UNI-PAK 21 TAB  Take 1 tablet (10 mg total) by mouth daily. 4 tab po daily for 3 days, the 3 tab po dalys for 3 days, 2 tab po daily for 3 days, 1 tab po daily for 3 days     QUEtiapine 100 MG tablet  Commonly known as:  SEROQUEL  Take 50 mg by mouth at bedtime.     sildenafil 100 MG tablet  Commonly known as:  VIAGRA  Take 100 mg by mouth daily as needed for erectile dysfunction. Do not take within 6 hours of Terazosin, Prazosin, or Doxazosin.     simvastatin 80 MG tablet  Commonly known as:  ZOCOR  Take 40 mg  by mouth at bedtime.     terazosin 2 MG capsule  Commonly known as:  HYTRIN  Take 4 mg by mouth at bedtime.     tiotropium 18 MCG inhalation capsule  Commonly known as:  SPIRIVA  Place 18 mcg into inhaler and inhale daily.     triamterene-hydrochlorothiazide 37.5-25 MG per tablet  Commonly known as:  MAXZIDE-25  Take 1 tablet by mouth daily.     VITAMIN D PO  Take 1,000 capsules by mouth daily.     ZYRTEC ALLERGY 10 MG tablet  Generic drug:  cetirizine  Take 10 mg by mouth daily.        Discharged Condition: improved    Consults: None  Significant Diagnostic Studies: Dg Chest 2 View  08/15/2014   CLINICAL DATA:  Fever, shortness of breath, COPD, former smoker  EXAM: CHEST  2 VIEW  COMPARISON:  05/23/2013  FINDINGS: Normal heart size, mediastinal contours and pulmonary vascularity.  RIGHT upper lobe infiltrate consistent with pneumonia.  Underlying bronchitic and emphysematous changes suggesting COPD.   No pleural effusion or pneumothorax.  Bones unremarkable.  IMPRESSION: Probable COPD changes with RIGHT upper lobe infiltrate consistent with pneumonia.   Electronically Signed   By: Ulyses Southward M.D.   On: 08/15/2014 12:41    Lab Results: Basic Metabolic Panel:  Recent Labs  16/10/96 0555 08/19/14 0616  NA 140 140  K 3.7 4.5  CL 102 99*  CO2 29 31  GLUCOSE 110* 100*  BUN 15 19  CREATININE 0.81 0.90  CALCIUM 8.8* 9.5   Liver Function Tests: No results for input(s): AST, ALT, ALKPHOS, BILITOT, PROT, ALBUMIN in the last 72 hours.   CBC:  Recent Labs  08/17/14 0556  WBC 16.6*  HGB 10.5*  HCT 31.9*  MCV 89.9  PLT 395    No results found for this or any previous visit (from the past 240 hour(s)).   Hospital Course:   This is a 69 years old male with of history of multiple medical illnesses was admitted due to shortness of breath and fever. He was found to have pneumonia and COPD excerebration. He was treated with IV antibiotics, steroid and nebulizer treatment. He im[proved and is being discharged in stable condition.  Discharge Exam: Blood pressure 175/92, pulse 87, temperature 98.8 F (37.1 C), temperature source Oral, resp. rate 18, height  (1.626 m), weight 72.576 kg (160 lb), SpO2 95 %.    Disposition:  home        Follow-up Information    Follow up with [facility], [place] In 1 week.   Specialty:  Internal Medicine   Contact information:   [address] [place] [address] [phone]       Signed: Avon Gully   08/19/2014, 8:24 AM

## 2014-08-19 NOTE — Care Management Note (Signed)
Case Management Note  Patient Details  Name: Mark BillsFreddie L Harshberger MRN: [id number] Date of Birth: 08/16/1945   Expected Discharge Date:                  Expected Discharge Plan:  Home/Self Care  In-House Referral:  NA  Discharge planning Services  CM Consult  Post Acute Care Choice:  NA Choice offered to:  NA  DME Arranged:    DME Agency:     HH Arranged:    HH Agency:     Status of Service:  Completed, signed off  Medicare Important Message Given:  Yes-second notification given Date Medicare IM Given:  08/16/14 Medicare IM give by:  Kathyrn SheriffJessica Mikaya Bunner, RN, MSN, CM  Date Additional Medicare IM Given:    Additional Medicare Important Message give by:     If discussed at Long Length of Stay Meetings, dates discussed:    Additional Comments: Pt discharged home with self care today. [facility]. No further CM needs at the time of discharge.  Malcolm Metrohildress, Suraiya Dickerson Demske, RN 08/19/2014, 9:23 AM

## 2014-08-19 NOTE — Progress Notes (Signed)
D.c instructions reviewed with patient.  Verbalized understanding.  Pt dc'd to home with wife. Schonewitz, Candelaria Stagers 08/19/2014

## 2014-08-19 NOTE — Care Management (Signed)
Important Message  Patient Details  Name: Mark Schroeder MRN: [id number] Date of Birth: 07/23/1945   Medicare Important Message Given:  Yes-second notification given    Malcolm MetroChildress, Trinette Vera Demske, RN 08/19/2014, 9:24 AM

## 2014-08-20 DIAGNOSIS — J189 Pneumonia, unspecified organism: Secondary | ICD-10-CM | POA: Diagnosis not present

## 2014-08-20 DIAGNOSIS — F319 Bipolar disorder, unspecified: Secondary | ICD-10-CM | POA: Diagnosis not present

## 2014-08-20 DIAGNOSIS — I10 Essential (primary) hypertension: Secondary | ICD-10-CM | POA: Diagnosis not present

## 2014-08-20 DIAGNOSIS — J449 Chronic obstructive pulmonary disease, unspecified: Secondary | ICD-10-CM | POA: Diagnosis not present

## 2014-08-23 DIAGNOSIS — I10 Essential (primary) hypertension: Secondary | ICD-10-CM | POA: Diagnosis not present

## 2014-08-23 DIAGNOSIS — J449 Chronic obstructive pulmonary disease, unspecified: Secondary | ICD-10-CM | POA: Diagnosis not present

## 2014-08-23 DIAGNOSIS — F319 Bipolar disorder, unspecified: Secondary | ICD-10-CM | POA: Diagnosis not present

## 2014-08-23 DIAGNOSIS — J189 Pneumonia, unspecified organism: Secondary | ICD-10-CM | POA: Diagnosis not present

## 2014-10-09 DIAGNOSIS — Z23 Encounter for immunization: Secondary | ICD-10-CM | POA: Diagnosis not present

## 2014-10-09 DIAGNOSIS — E1165 Type 2 diabetes mellitus with hyperglycemia: Secondary | ICD-10-CM | POA: Diagnosis not present

## 2014-10-09 DIAGNOSIS — J449 Chronic obstructive pulmonary disease, unspecified: Secondary | ICD-10-CM | POA: Diagnosis not present

## 2014-10-09 DIAGNOSIS — F319 Bipolar disorder, unspecified: Secondary | ICD-10-CM | POA: Diagnosis not present

## 2014-10-09 DIAGNOSIS — F17200 Nicotine dependence, unspecified, uncomplicated: Secondary | ICD-10-CM | POA: Diagnosis not present

## 2014-11-04 DIAGNOSIS — E1165 Type 2 diabetes mellitus with hyperglycemia: Secondary | ICD-10-CM | POA: Diagnosis not present

## 2015-01-23 DIAGNOSIS — E1165 Type 2 diabetes mellitus with hyperglycemia: Secondary | ICD-10-CM | POA: Diagnosis not present

## 2015-07-11 DIAGNOSIS — E1165 Type 2 diabetes mellitus with hyperglycemia: Secondary | ICD-10-CM | POA: Diagnosis not present

## 2015-10-06 DIAGNOSIS — R739 Hyperglycemia, unspecified: Secondary | ICD-10-CM | POA: Diagnosis not present

## 2015-10-06 DIAGNOSIS — E1165 Type 2 diabetes mellitus with hyperglycemia: Secondary | ICD-10-CM | POA: Diagnosis not present

## 2015-10-06 DIAGNOSIS — J449 Chronic obstructive pulmonary disease, unspecified: Secondary | ICD-10-CM | POA: Diagnosis not present

## 2015-10-06 DIAGNOSIS — Z89511 Acquired absence of right leg below knee: Secondary | ICD-10-CM | POA: Diagnosis not present

## 2015-10-06 DIAGNOSIS — I10 Essential (primary) hypertension: Secondary | ICD-10-CM | POA: Diagnosis not present

## 2019-04-13 ENCOUNTER — Emergency Department (HOSPITAL_COMMUNITY): Payer: No Typology Code available for payment source

## 2019-04-13 ENCOUNTER — Encounter (HOSPITAL_COMMUNITY): Payer: Self-pay | Admitting: Emergency Medicine

## 2019-04-13 ENCOUNTER — Inpatient Hospital Stay (HOSPITAL_COMMUNITY)
Admission: EM | Admit: 2019-04-13 | Discharge: 2019-04-17 | DRG: 193 | Disposition: A | Discharge status: Home / Self Care | Payer: No Typology Code available for payment source | Attending: Family Medicine | Admitting: Family Medicine

## 2019-04-13 ENCOUNTER — Other Ambulatory Visit: Payer: Self-pay

## 2019-04-13 DIAGNOSIS — J9601 Acute respiratory failure with hypoxia: Secondary | ICD-10-CM | POA: Diagnosis present

## 2019-04-13 DIAGNOSIS — E119 Type 2 diabetes mellitus without complications: Secondary | ICD-10-CM

## 2019-04-13 DIAGNOSIS — E1169 Type 2 diabetes mellitus with other specified complication: Secondary | ICD-10-CM

## 2019-04-13 DIAGNOSIS — Z7982 Long term (current) use of aspirin: Secondary | ICD-10-CM | POA: Diagnosis not present

## 2019-04-13 DIAGNOSIS — Z20822 Contact with and (suspected) exposure to covid-19: Secondary | ICD-10-CM | POA: Diagnosis present

## 2019-04-13 DIAGNOSIS — E876 Hypokalemia: Secondary | ICD-10-CM | POA: Diagnosis present

## 2019-04-13 DIAGNOSIS — Z794 Long term (current) use of insulin: Secondary | ICD-10-CM

## 2019-04-13 DIAGNOSIS — Z8249 Family history of ischemic heart disease and other diseases of the circulatory system: Secondary | ICD-10-CM

## 2019-04-13 DIAGNOSIS — F4310 Post-traumatic stress disorder, unspecified: Secondary | ICD-10-CM | POA: Diagnosis present

## 2019-04-13 DIAGNOSIS — F32A Depression, unspecified: Secondary | ICD-10-CM | POA: Diagnosis present

## 2019-04-13 DIAGNOSIS — E1142 Type 2 diabetes mellitus with diabetic polyneuropathy: Secondary | ICD-10-CM | POA: Diagnosis present

## 2019-04-13 DIAGNOSIS — Z87891 Personal history of nicotine dependence: Secondary | ICD-10-CM | POA: Diagnosis not present

## 2019-04-13 DIAGNOSIS — Z89511 Acquired absence of right leg below knee: Secondary | ICD-10-CM

## 2019-04-13 DIAGNOSIS — D649 Anemia, unspecified: Secondary | ICD-10-CM | POA: Diagnosis present

## 2019-04-13 DIAGNOSIS — J440 Chronic obstructive pulmonary disease with acute lower respiratory infection: Secondary | ICD-10-CM | POA: Diagnosis present

## 2019-04-13 DIAGNOSIS — I10 Essential (primary) hypertension: Secondary | ICD-10-CM | POA: Diagnosis present

## 2019-04-13 DIAGNOSIS — R17 Unspecified jaundice: Secondary | ICD-10-CM | POA: Diagnosis present

## 2019-04-13 DIAGNOSIS — E785 Hyperlipidemia, unspecified: Secondary | ICD-10-CM | POA: Diagnosis present

## 2019-04-13 DIAGNOSIS — J441 Chronic obstructive pulmonary disease with (acute) exacerbation: Secondary | ICD-10-CM | POA: Diagnosis present

## 2019-04-13 DIAGNOSIS — F329 Major depressive disorder, single episode, unspecified: Secondary | ICD-10-CM | POA: Diagnosis present

## 2019-04-13 DIAGNOSIS — Z79899 Other long term (current) drug therapy: Secondary | ICD-10-CM | POA: Diagnosis not present

## 2019-04-13 DIAGNOSIS — Z7951 Long term (current) use of inhaled steroids: Secondary | ICD-10-CM | POA: Diagnosis not present

## 2019-04-13 DIAGNOSIS — J189 Pneumonia, unspecified organism: Secondary | ICD-10-CM | POA: Diagnosis present

## 2019-04-13 HISTORY — DX: Hyperlipidemia, unspecified: E78.5

## 2019-04-13 HISTORY — DX: Type 2 diabetes mellitus with diabetic polyneuropathy: E11.42

## 2019-04-13 LAB — CBG MONITORING, ED: Glucose-Capillary: 187 mg/dL — ABNORMAL HIGH (ref 70–99)

## 2019-04-13 LAB — PHOSPHORUS: Phosphorus: 3.7 mg/dL (ref 2.5–4.6)

## 2019-04-13 LAB — POC SARS CORONAVIRUS 2 AG -  ED: SARS Coronavirus 2 Ag: NEGATIVE

## 2019-04-13 LAB — COMPREHENSIVE METABOLIC PANEL WITH GFR
ALT: 28 U/L (ref 0–44)
AST: 26 U/L (ref 15–41)
Albumin: 3.6 g/dL (ref 3.5–5.0)
Alkaline Phosphatase: 62 U/L (ref 38–126)
Anion gap: 13 (ref 5–15)
BUN: 12 mg/dL (ref 8–23)
CO2: 31 mmol/L (ref 22–32)
Calcium: 9 mg/dL (ref 8.9–10.3)
Chloride: 96 mmol/L — ABNORMAL LOW (ref 98–111)
Creatinine, Ser: 1.06 mg/dL (ref 0.61–1.24)
GFR calc Af Amer: >60 mL/min
GFR calc non Af Amer: >60 mL/min
Glucose, Bld: 112 mg/dL — ABNORMAL HIGH (ref 70–99)
Potassium: 3 mmol/L — ABNORMAL LOW (ref 3.5–5.1)
Sodium: 140 mmol/L (ref 135–145)
Total Bilirubin: 1.5 mg/dL — ABNORMAL HIGH (ref 0.3–1.2)
Total Protein: 8.4 g/dL — ABNORMAL HIGH (ref 6.5–8.1)

## 2019-04-13 LAB — CBC WITH DIFFERENTIAL/PLATELET
Abs Immature Granulocytes: 0.07 10*3/uL (ref 0.00–0.07)
Basophils Absolute: 0 10*3/uL (ref 0.0–0.1)
Basophils Relative: 0 %
Eosinophils Absolute: 0.1 10*3/uL (ref 0.0–0.5)
Eosinophils Relative: 1 %
HCT: 38.8 % — ABNORMAL LOW (ref 39.0–52.0)
Hemoglobin: 12.4 g/dL — ABNORMAL LOW (ref 13.0–17.0)
Immature Granulocytes: 1 %
Lymphocytes Relative: 11 %
Lymphs Abs: 1.6 10*3/uL (ref 0.7–4.0)
MCH: 30.3 pg (ref 26.0–34.0)
MCHC: 32 g/dL (ref 30.0–36.0)
MCV: 94.9 fL (ref 80.0–100.0)
Monocytes Absolute: 1.3 10*3/uL — ABNORMAL HIGH (ref 0.1–1.0)
Monocytes Relative: 9 %
Neutro Abs: 12 10*3/uL — ABNORMAL HIGH (ref 1.7–7.7)
Neutrophils Relative %: 78 %
Platelets: 409 10*3/uL — ABNORMAL HIGH (ref 150–400)
RBC: 4.09 MIL/uL — ABNORMAL LOW (ref 4.22–5.81)
RDW: 13 % (ref 11.5–15.5)
WBC: 15 10*3/uL — ABNORMAL HIGH (ref 4.0–10.5)
nRBC: 0 % (ref 0.0–0.2)

## 2019-04-13 LAB — RESPIRATORY PANEL BY RT PCR (FLU A&B, COVID)
Influenza A by PCR: NEGATIVE
Influenza B by PCR: NEGATIVE
SARS Coronavirus 2 by RT PCR: NEGATIVE

## 2019-04-13 LAB — LACTIC ACID, PLASMA
Lactic Acid, Venous: 1.5 mmol/L (ref 0.5–1.9)
Lactic Acid, Venous: 0.9 mmol/L (ref 0.5–1.9)

## 2019-04-13 LAB — MAGNESIUM: Magnesium: 2.7 mg/dL — ABNORMAL HIGH (ref 1.7–2.4)

## 2019-04-13 LAB — TROPONIN I (HIGH SENSITIVITY)
Troponin I (High Sensitivity): 5 ng/L (ref <18)
Troponin I (High Sensitivity): 4 ng/L (ref <18)

## 2019-04-13 MED ORDER — BUPROPION HCL ER (SR) 150 MG PO TB12
150.0000 mg | ORAL_TABLET | Freq: Two times a day (BID) | ORAL | Status: DC
  Administered 2019-04-14 – 2019-04-17 (×8): 150 mg via ORAL
  Filled 2019-04-13 (×8): qty 1

## 2019-04-13 MED ORDER — ACETAMINOPHEN 325 MG PO TABS: 650.0000 mg | ORAL_TABLET | Freq: Four times a day (QID) | ORAL | Status: DC | PRN

## 2019-04-13 MED ORDER — SODIUM CHLORIDE 0.9 % IV SOLN
2.0000 g | INTRAVENOUS | Status: DC
  Administered 2019-04-14 – 2019-04-16 (×3): 2 g via INTRAVENOUS
  Filled 2019-04-13 (×3): qty 20

## 2019-04-13 MED ORDER — METHYLPREDNISOLONE SODIUM SUCC 125 MG IJ SOLR
80.0000 mg | Freq: Once | INTRAMUSCULAR | Status: AC
  Administered 2019-04-13: 80 mg via INTRAVENOUS
  Filled 2019-04-13: qty 2

## 2019-04-13 MED ORDER — IPRATROPIUM BROMIDE HFA 17 MCG/ACT IN AERS: 2.0000 | INHALATION_SPRAY | Freq: Once | RESPIRATORY_TRACT | Status: DC

## 2019-04-13 MED ORDER — OXYCODONE-ACETAMINOPHEN 5-325 MG PO TABS
1.0000 | ORAL_TABLET | Freq: Four times a day (QID) | ORAL | Status: DC | PRN
  Administered 2019-04-15: 1 via ORAL
  Filled 2019-04-13: qty 1

## 2019-04-13 MED ORDER — IPRATROPIUM-ALBUTEROL 20-100 MCG/ACT IN AERS: 1.0000 | INHALATION_SPRAY | Freq: Four times a day (QID) | RESPIRATORY_TRACT | Status: DC | PRN

## 2019-04-13 MED ORDER — KETOROLAC TROMETHAMINE 15 MG/ML IJ SOLN: 15.0000 mg | Freq: Once | INTRAMUSCULAR | Status: DC

## 2019-04-13 MED ORDER — ATORVASTATIN CALCIUM 40 MG PO TABS
40.0000 mg | ORAL_TABLET | Freq: Every day | ORAL | Status: DC
  Administered 2019-04-14 – 2019-04-16 (×4): 40 mg via ORAL
  Filled 2019-04-13 (×4): qty 1

## 2019-04-13 MED ORDER — IPRATROPIUM-ALBUTEROL 0.5-2.5 (3) MG/3ML IN SOLN
3.0000 mL | Freq: Once | RESPIRATORY_TRACT | Status: AC
  Administered 2019-04-13: 20:00:00 3 mL via RESPIRATORY_TRACT
  Filled 2019-04-13: qty 3

## 2019-04-13 MED ORDER — PANTOPRAZOLE SODIUM 40 MG PO TBEC
40.0000 mg | DELAYED_RELEASE_TABLET | Freq: Two times a day (BID) | ORAL | Status: DC
  Administered 2019-04-14 – 2019-04-17 (×8): 40 mg via ORAL
  Filled 2019-04-13 (×8): qty 1

## 2019-04-13 MED ORDER — POTASSIUM CHLORIDE 20 MEQ PO PACK
40.0000 meq | PACK | Freq: Once | ORAL | Status: AC
  Administered 2019-04-13: 19:00:00 40 meq via ORAL
  Filled 2019-04-13: qty 2

## 2019-04-13 MED ORDER — IPRATROPIUM-ALBUTEROL 20-100 MCG/ACT IN AERS: 1.0000 | INHALATION_SPRAY | Freq: Four times a day (QID) | RESPIRATORY_TRACT | Status: DC

## 2019-04-13 MED ORDER — POTASSIUM CHLORIDE IN NACL 40-0.9 MEQ/L-% IV SOLN
INTRAVENOUS | Status: AC
  Administered 2019-04-14: 01:00:00 100 mL/h via INTRAVENOUS
  Filled 2019-04-13: qty 1000

## 2019-04-13 MED ORDER — SODIUM CHLORIDE 0.9 % IV SOLN
500.0000 mg | Freq: Once | INTRAVENOUS | Status: AC
  Administered 2019-04-13: 20:00:00 500 mg via INTRAVENOUS
  Filled 2019-04-13: qty 500

## 2019-04-13 MED ORDER — QUETIAPINE FUMARATE 25 MG PO TABS
50.0000 mg | ORAL_TABLET | Freq: Every day | ORAL | Status: DC
  Administered 2019-04-14 – 2019-04-16 (×4): 50 mg via ORAL
  Filled 2019-04-13 (×4): qty 2

## 2019-04-13 MED ORDER — ACETAMINOPHEN 650 MG RE SUPP: 650.0000 mg | Freq: Four times a day (QID) | RECTAL | Status: DC | PRN

## 2019-04-13 MED ORDER — INSULIN ASPART 100 UNIT/ML ~~LOC~~ SOLN: 0.0000 [IU] | Freq: Every day | SUBCUTANEOUS | Status: DC

## 2019-04-13 MED ORDER — GUAIFENESIN ER 600 MG PO TB12
600.0000 mg | ORAL_TABLET | Freq: Two times a day (BID) | ORAL | Status: DC
  Administered 2019-04-14 – 2019-04-17 (×8): 600 mg via ORAL
  Filled 2019-04-13 (×8): qty 1

## 2019-04-13 MED ORDER — SODIUM CHLORIDE 0.9 % IV SOLN
500.0000 mg | INTRAVENOUS | Status: DC
  Administered 2019-04-14 – 2019-04-16 (×3): 500 mg via INTRAVENOUS
  Filled 2019-04-13 (×2): qty 500

## 2019-04-13 MED ORDER — ALBUTEROL SULFATE HFA 108 (90 BASE) MCG/ACT IN AERS
8.0000 | INHALATION_SPRAY | Freq: Once | RESPIRATORY_TRACT | Status: AC
  Administered 2019-04-13: 18:00:00 8 via RESPIRATORY_TRACT
  Filled 2019-04-13: qty 6.7

## 2019-04-13 MED ORDER — GABAPENTIN 300 MG PO CAPS
900.0000 mg | ORAL_CAPSULE | Freq: Every day | ORAL | Status: DC
  Administered 2019-04-14 – 2019-04-16 (×4): 900 mg via ORAL
  Filled 2019-04-13 (×5): qty 3

## 2019-04-13 MED ORDER — MAGNESIUM SULFATE 2 GM/50ML IV SOLN
2.0000 g | Freq: Once | INTRAVENOUS | Status: AC
  Administered 2019-04-13: 18:00:00 2 g via INTRAVENOUS
  Filled 2019-04-13: qty 50

## 2019-04-13 MED ORDER — TRIAMTERENE-HCTZ 37.5-25 MG PO TABS
1.0000 | ORAL_TABLET | Freq: Every day | ORAL | Status: DC
  Administered 2019-04-14 – 2019-04-17 (×4): 1 via ORAL
  Filled 2019-04-13 (×4): qty 1

## 2019-04-13 MED ORDER — SODIUM CHLORIDE 0.9 % IV SOLN
1.0000 g | Freq: Once | INTRAVENOUS | Status: AC
  Administered 2019-04-13: 20:00:00 1 g via INTRAVENOUS
  Filled 2019-04-13: qty 10

## 2019-04-13 MED ORDER — ALBUTEROL SULFATE (2.5 MG/3ML) 0.083% IN NEBU
2.5000 mg | INHALATION_SOLUTION | RESPIRATORY_TRACT | Status: DC | PRN
  Administered 2019-04-15: 13:00:00 2.5 mg via RESPIRATORY_TRACT
  Filled 2019-04-13 (×2): qty 3

## 2019-04-13 MED ORDER — ACETAMINOPHEN 325 MG PO TABS
650.0000 mg | ORAL_TABLET | Freq: Once | ORAL | Status: AC
  Administered 2019-04-13: 17:00:00 650 mg via ORAL
  Filled 2019-04-13: qty 2

## 2019-04-13 MED ORDER — ACETAMINOPHEN 325 MG PO TABS: 650.0000 mg | ORAL_TABLET | Freq: Once | ORAL | Status: DC | PRN

## 2019-04-13 MED ORDER — DOXEPIN HCL 25 MG PO CAPS
100.0000 mg | ORAL_CAPSULE | Freq: Every day | ORAL | Status: DC
  Administered 2019-04-14 – 2019-04-16 (×4): 100 mg via ORAL
  Filled 2019-04-13 (×4): qty 4

## 2019-04-13 MED ORDER — INSULIN ASPART 100 UNIT/ML ~~LOC~~ SOLN
0.0000 [IU] | Freq: Three times a day (TID) | SUBCUTANEOUS | Status: DC
  Administered 2019-04-14: 12:00:00 2 [IU] via SUBCUTANEOUS
  Administered 2019-04-14 (×2): 3 [IU] via SUBCUTANEOUS
  Administered 2019-04-15 – 2019-04-16 (×2): 2 [IU] via SUBCUTANEOUS
  Administered 2019-04-16 – 2019-04-17 (×3): 3 [IU] via SUBCUTANEOUS

## 2019-04-13 MED ORDER — GABAPENTIN 300 MG PO CAPS
300.0000 mg | ORAL_CAPSULE | Freq: Three times a day (TID) | ORAL | Status: DC
  Administered 2019-04-14 – 2019-04-17 (×10): 300 mg via ORAL
  Filled 2019-04-13 (×11): qty 1

## 2019-04-13 MED ORDER — CARVEDILOL 12.5 MG PO TABS
12.5000 mg | ORAL_TABLET | Freq: Two times a day (BID) | ORAL | Status: DC
  Administered 2019-04-14 – 2019-04-17 (×8): 12.5 mg via ORAL
  Filled 2019-04-13 (×8): qty 1

## 2019-04-13 MED ORDER — ASPIRIN EC 81 MG PO TBEC
81.0000 mg | DELAYED_RELEASE_TABLET | Freq: Every day | ORAL | Status: DC
  Administered 2019-04-14 – 2019-04-17 (×4): 81 mg via ORAL
  Filled 2019-04-13 (×4): qty 1

## 2019-04-13 MED ORDER — METHYLPREDNISOLONE SODIUM SUCC 125 MG IJ SOLR
125.0000 mg | Freq: Once | INTRAMUSCULAR | Status: DC
  Filled 2019-04-13: qty 2

## 2019-04-13 MED ORDER — ENOXAPARIN SODIUM 40 MG/0.4ML ~~LOC~~ SOLN
40.0000 mg | SUBCUTANEOUS | Status: DC
  Administered 2019-04-14 – 2019-04-15 (×2): 40 mg via SUBCUTANEOUS
  Filled 2019-04-13 (×2): qty 0.4

## 2019-04-13 MED ORDER — TERAZOSIN HCL 1 MG PO CAPS
4.0000 mg | ORAL_CAPSULE | Freq: Every day | ORAL | Status: DC
  Administered 2019-04-14 – 2019-04-16 (×4): 4 mg via ORAL
  Filled 2019-04-13 (×3): qty 4

## 2019-04-13 NOTE — ED Triage Notes (Signed)
Patient complains of shortness of breath that got worse today. History of COPD. No oxygen use at home.

## 2019-04-13 NOTE — ED Notes (Signed)
Pt states his breathing is much better.
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
## 2019-04-13 NOTE — ED Provider Notes (Signed)
[facility] EMERGENCY DEPARTMENT Provider Note   CSN: [id number] Arrival date & time: 04/13/19  1458     History Chief Complaint  Patient presents with   Shortness of Breath    Mark Schroeder is a 74 y.o. male with history of COPD and Type 2 DM who presents with SOB. He states that he has become increasingly short of breath for the past couple of weeks. He has been using home inhalers and nebs without relief. He reports associated cough productive of green sputum. He denies fever, chills, body aches, anosmia, abdominal pain, N/V/D. He started to have chest pain last night. It's over the right side of his chest. It is constant and non-radiating. It feels like a "knot". He denies any leg swelling. Has not been on steroids recently.   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 No pertinent family history.  Social History   Tobacco Use   Smoking status: Former Smoker    Packs/day: 0.25    Years: 35.00    Pack years: 8.75    Types: Cigarettes   Smokeless tobacco: Never Used  Substance Use Topics   Alcohol use: No   Drug use: No    Home Medications Prior to Admission medications   Medication Sig Start Date End Date Taking? Authorizing Provider  albuterol (PROVENTIL HFA;VENTOLIN  HFA) 108 (90 BASE) MCG/ACT inhaler Inhale 2 puffs into the lungs 4 (four) times daily.    Carles Collet, MD  albuterol (PROVENTIL) (2.5 MG/3ML) 0.083% nebulizer solution Take 2.5 mg by nebulization 3 (three) times daily as needed for wheezing or shortness of breath.    [provider]  amoxicillin-clavulanate (AUGMENTIN) 500-125 MG per tablet Take 1 tablet (500 mg total) by mouth 3 (three) times daily. 08/19/14   Avon Gully, MD  aspirin EC 81 MG tablet Take 81 mg by mouth daily.    [provider]  budesonide-formoterol (SYMBICORT) 160-4.5 MCG/ACT inhaler Inhale 2 puffs into the lungs 2 (two) times daily.    [provider]  buPROPion (ZYBAN) 150 MG 12 hr tablet Take 150 mg by mouth 2 (two) times daily.    [provider]  carvedilol (COREG) 12.5 MG tablet Take 12.5 mg by mouth every 12 (twelve) hours.    [provider]  cetirizine (ZYRTEC ALLERGY) 10 MG tablet Take 10 mg by mouth daily.    [provider]  Cholecalciferol (VITAMIN D PO) Take 1,000 capsules by mouth daily.     [provider]  cyclobenzaprine (FLEXERIL) 10 MG tablet Take 10 mg by mouth every 8 (eight) hours as needed for muscle spasms.    [provider]  doxepin (SINEQUAN) 50 MG capsule Take 100 mg by mouth at bedtime.  [provider]  fluticasone (FLONASE) 50 MCG/ACT nasal spray Place 2 sprays into both nostrils at bedtime.    [provider]  gabapentin (NEURONTIN) 300 MG capsule Take 300-1,200 mg by mouth 4 (four) times daily. 1 capsule four times daily and 3 capsules at bedtime.    [provider]  guaiFENesin (ROBITUSSIN) 100 MG/5ML SOLN Take 5 mLs by mouth 4 (four) times daily as needed for cough or to loosen phlegm.    [provider]  oxyCODONE-acetaminophen (PERCOCET/ROXICET) 5-325 MG per tablet Take 1 tablet by mouth every 6 (six) hours as needed for moderate pain or severe pain.    [provider]   pantoprazole (PROTONIX) 40 MG tablet Take 40 mg by mouth 2 (two) times daily.     [provider]  predniSONE (STERAPRED UNI-PAK 21 TAB) 10 MG (21) TBPK tablet Take 1 tablet (10 mg total) by mouth daily. 4 tab po daily for 3 days, the 3 tab po dalys for 3 days, 2 tab po daily for 3 days, 1 tab po daily for 3 days 08/19/14   Avon Gully, MD  QUEtiapine (SEROQUEL) 100 MG tablet Take 50 mg by mouth at bedtime.    [provider]  sildenafil (VIAGRA) 100 MG tablet Take 100 mg by mouth daily as needed for erectile dysfunction. Do not take within 6 hours of Terazosin, Prazosin, or Doxazosin.    [provider]  simvastatin (ZOCOR) 80 MG tablet Take 40 mg by mouth at bedtime.    [provider]  terazosin (HYTRIN) 2 MG capsule Take 4 mg by mouth at bedtime.    [provider]  tiotropium (SPIRIVA) 18 MCG inhalation capsule Place 18 mcg into inhaler and inhale daily.    [provider]  triamterene-hydrochlorothiazide (MAXZIDE-25) 37.5-25 MG per tablet Take 1 tablet by mouth daily.    [provider]    Allergies    Patient has no known allergies.  Review of Systems   Review of Systems  Constitutional: Negative for appetite change, chills and fever.  Respiratory: Positive for cough, shortness of breath and wheezing.   Cardiovascular: Positive for chest pain. Negative for palpitations and leg swelling.  Gastrointestinal: Negative for abdominal pain, nausea and vomiting.  All other systems reviewed and are negative.   Physical Exam Updated Vital Signs BP (!) 146/90    Pulse (!) 102    Temp (!) 103.1 F (39.5 C)    Resp (!) 30    Ht 5\' 5"  (1.651 m)    Wt 68 kg    SpO2 99%    BMI 24.96 kg/m   Physical Exam Vitals and nursing note reviewed.  Constitutional:      General: He is not in acute distress.    Appearance: He is well-developed. He is not ill-appearing.     Comments: Calm and cooperative. Mild respiratory distress and  tachypnea  HENT:     Head: Normocephalic and atraumatic.  Eyes:     General: No scleral icterus.       Right eye: No discharge.        Left eye: No discharge.     Conjunctiva/sclera: Conjunctivae normal.     Pupils: Pupils are equal, round, and reactive to light.  Cardiovascular:     Rate and Rhythm: Normal rate and regular rhythm.  Pulmonary:     Effort: Tachypnea, accessory muscle usage and respiratory distress present.  Chest:     Chest wall: No tenderness.  Abdominal:  General: There is no distension.     Palpations: Abdomen is soft.     Tenderness: There is no abdominal tenderness.     Comments: Large midline well healed abdominal scar  Musculoskeletal:     Cervical back: Normal range of motion.     Left lower leg: No edema.  Skin:    General: Skin is warm and dry.  Neurological:     Mental Status: He is alert and oriented to person, place, and time.  Psychiatric:        Behavior: Behavior normal.     ED Results / Procedures / Treatments   Labs (all labs ordered are listed, but only abnormal results are displayed) Labs Reviewed  CBC WITH DIFFERENTIAL/PLATELET - Abnormal; Notable for the following components:      Result Value   WBC 15.0 (*)    RBC 4.09 (*)    Hemoglobin 12.4 (*)    HCT 38.8 (*)    Platelets 409 (*)    Neutro Abs 12.0 (*)    Monocytes Absolute 1.3 (*)    All other components within normal limits  COMPREHENSIVE METABOLIC PANEL - Abnormal; Notable for the following components:   Potassium 3.0 (*)    Chloride 96 (*)    Glucose, Bld 112 (*)    Total Protein 8.4 (*)    Total Bilirubin 1.5 (*)    All other components within normal limits  CULTURE, BLOOD (ROUTINE X 2)  CULTURE, BLOOD (ROUTINE X 2)  RESPIRATORY PANEL BY RT PCR (FLU A&B, COVID)  EXPECTORATED SPUTUM ASSESSMENT W REFEX TO RESP CULTURE  LACTIC ACID, PLASMA  LACTIC ACID, PLASMA  CBC WITH DIFFERENTIAL/PLATELET  COMPREHENSIVE METABOLIC PANEL  STREP PNEUMONIAE URINARY ANTIGEN   HEMOGLOBIN A1C  MAGNESIUM  PHOSPHORUS  POC SARS CORONAVIRUS 2 AG -  ED  TROPONIN I (HIGH SENSITIVITY)  TROPONIN I (HIGH SENSITIVITY)    EKG EKG Interpretation  Date/Time:  Friday April 13 2019 16:31:41 EST Ventricular Rate:  106 PR Interval:    QRS Duration: 84 QT Interval:  334 QTC Calculation: 444 R Axis:   72 Text Interpretation: Sinus tachycardia Confirmed by Donnetta Hutching ([id number]) on 04/13/2019 5:11:00 PM Also confirmed by Donnetta Hutching ([id number])  on 04/13/2019 5:12:09 PM   Radiology DG Chest Portable 1 View  Result Date: 04/13/2019 CLINICAL DATA:  74 year old male with shortness of breath. EXAM: PORTABLE CHEST 1 VIEW COMPARISON:  Chest radiograph dated 08/15/2014. FINDINGS: There is background of emphysema. Patchy area of opacity in the right upper lobe most consistent with pneumonia. Clinical correlation and follow-up to resolution is recommended to exclude underlying mass. There is no pleural effusion or pneumothorax. The cardiac silhouette is within normal limits. No acute osseous pathology. IMPRESSION: Probable right upper lobe pneumonia. Clinical correlation and close follow-up recommended to document resolution. Electronically Signed   By: Elgie Collard M.D.   On: 04/13/2019 17:48    Procedures Procedures (including critical care time)  CRITICAL CARE Performed by: Bethel Born   Total critical care time: 35 minutes  Critical care time was exclusive of separately billable procedures and treating other patients.  Critical care was necessary to treat or prevent imminent or life-threatening deterioration.  Critical care was time spent personally by me on the following activities: development of treatment plan with patient and/or surrogate as well as nursing, discussions with consultants, evaluation of patient's response to treatment, examination of patient, obtaining history from patient or surrogate, ordering and performing treatments and interventions, ordering  and review  of laboratory studies, ordering and review of radiographic studies, pulse oximetry and re-evaluation of patient's condition.   Medications Ordered in ED Medications  cefTRIAXone (ROCEPHIN) 1 g in sodium chloride 0.9 % 100 mL IVPB (1 g Intravenous New Bag/Given 04/13/19 2008)  azithromycin (ZITHROMAX) 500 mg in sodium chloride 0.9 % 250 mL IVPB (500 mg Intravenous New Bag/Given 04/13/19 2007)  enoxaparin (LOVENOX) injection 40 mg (has no administration in time range)  acetaminophen (TYLENOL) tablet 650 mg (has no administration in time range)    Or  acetaminophen (TYLENOL) suppository 650 mg (has no administration in time range)  cefTRIAXone (ROCEPHIN) 2 g in sodium chloride 0.9 % 100 mL IVPB (has no administration in time range)  azithromycin (ZITHROMAX) 500 mg in sodium chloride 0.9 % 250 mL IVPB (has no administration in time range)  insulin aspart (novoLOG) injection 0-5 Units (has no administration in time range)  insulin aspart (novoLOG) injection 0-15 Units (has no administration in time range)  acetaminophen (TYLENOL) tablet 650 mg (650 mg Oral Given 04/13/19 1643)  albuterol (VENTOLIN HFA) 108 (90 Base) MCG/ACT inhaler 8 puff (8 puffs Inhalation Given 04/13/19 1730)  magnesium sulfate IVPB 2 g 50 mL (0 g Intravenous Stopped 04/13/19 1833)  potassium chloride (KLOR-CON) packet 40 mEq (40 mEq Oral Given 04/13/19 1924)  methylPREDNISolone sodium succinate (SOLU-MEDROL) 125 mg/2 mL injection 80 mg (80 mg Intravenous Given 04/13/19 2004)  ipratropium-albuterol (DUONEB) 0.5-2.5 (3) MG/3ML nebulizer solution 3 mL (3 mLs Nebulization Given 04/13/19 2011)    ED Course  I have reviewed the triage vital signs and the nursing notes.  Pertinent labs & imaging results that were available during my care of the patient were reviewed by me and considered in my medical decision making (see chart for details).  74 year old male presents with shortness of breath and chest pain. Seems to be worse  over the past couple weeks. He is febrile here to 103. He is mildly hypoxic to 89%, is requiring 2L via [place], and is tachypneic. He is mildly tachycardic with a regular rhythm. Lung sounds are diminished and there is slight wheezing. Will obtain rapid COVID, EKG, CXR, labs. Will give albuterol.  EKG is sinus tachycardia. CBC is remarkable for leukocytosis of 15 and mild anemia (hgb 12). CMP is remarkable for hypokalemia (3). Trop is 5 and 4 respectively. Rapid and PCR COVID is negative. CXR shows a RUL pneumonia which is where he is having chest pain. Lactate is normal and blood cultures obtained. Shared visit with Dr. Adriana Simas.  Will admit for further management. Discussed with Dr. Robb Matar who will admit for acute respiratory failure due to CAP and COPD exacerbation.  Mark Schroeder was evaluated in Emergency Department on 04/13/2019 for the symptoms described in the history of present illness. He was evaluated in the context of the global COVID-19 pandemic, which necessitated consideration that the patient might be at risk for infection with the SARS-CoV-2 virus that causes COVID-19. Institutional protocols and algorithms that pertain to the evaluation of patients at risk for COVID-19 are in a state of rapid change based on information released by regulatory bodies including the CDC and federal and state organizations. These policies and algorithms were followed during the patient's care in the ED.  MDM Rules/Calculators/A&P                       Final Clinical Impression(s) / ED Diagnoses Final diagnoses:  Community acquired pneumonia of right upper lobe of lung  COPD exacerbation ([place])  Hypokalemia    Rx / DC Orders ED Discharge Orders    None       Iris Pert 04/13/19 2031    Nat Christen, MD 04/14/19 [phone]

## 2019-04-13 NOTE — ED Notes (Signed)
With verbal consent obtained from Pt, family updated on Pt status via telephone. Pts spouse informed of visitation policy in place and Pt agreed to update spouse on room assignment once assigned.

## 2019-04-14 DIAGNOSIS — E876 Hypokalemia: Secondary | ICD-10-CM

## 2019-04-14 DIAGNOSIS — J441 Chronic obstructive pulmonary disease with (acute) exacerbation: Secondary | ICD-10-CM

## 2019-04-14 DIAGNOSIS — I10 Essential (primary) hypertension: Secondary | ICD-10-CM

## 2019-04-14 DIAGNOSIS — J189 Pneumonia, unspecified organism: Principal | ICD-10-CM

## 2019-04-14 DIAGNOSIS — E1142 Type 2 diabetes mellitus with diabetic polyneuropathy: Secondary | ICD-10-CM

## 2019-04-14 LAB — GLUCOSE, CAPILLARY
Glucose-Capillary: 109 mg/dL — ABNORMAL HIGH (ref 70–99)
Glucose-Capillary: 133 mg/dL — ABNORMAL HIGH (ref 70–99)
Glucose-Capillary: 157 mg/dL — ABNORMAL HIGH (ref 70–99)
Glucose-Capillary: 157 mg/dL — ABNORMAL HIGH (ref 70–99)
Glucose-Capillary: 163 mg/dL — ABNORMAL HIGH (ref 70–99)
Glucose-Capillary: 167 mg/dL — ABNORMAL HIGH (ref 70–99)

## 2019-04-14 LAB — CBC WITH DIFFERENTIAL/PLATELET
Abs Immature Granulocytes: 0.06 10*3/uL (ref 0.00–0.07)
Basophils Absolute: 0 10*3/uL (ref 0.0–0.1)
Basophils Relative: 0 %
Eosinophils Absolute: 0 10*3/uL (ref 0.0–0.5)
Eosinophils Relative: 0 %
HCT: 35.7 % — ABNORMAL LOW (ref 39.0–52.0)
Hemoglobin: 11.3 g/dL — ABNORMAL LOW (ref 13.0–17.0)
Immature Granulocytes: 0 %
Lymphocytes Relative: 5 %
Lymphs Abs: 0.7 10*3/uL (ref 0.7–4.0)
MCH: 30.1 pg (ref 26.0–34.0)
MCHC: 31.7 g/dL (ref 30.0–36.0)
MCV: 95.2 fL (ref 80.0–100.0)
Monocytes Absolute: 0.3 10*3/uL (ref 0.1–1.0)
Monocytes Relative: 2 %
Neutro Abs: 13.2 10*3/uL — ABNORMAL HIGH (ref 1.7–7.7)
Neutrophils Relative %: 93 %
Platelets: 401 10*3/uL — ABNORMAL HIGH (ref 150–400)
RBC: 3.75 MIL/uL — ABNORMAL LOW (ref 4.22–5.81)
RDW: 13.2 % (ref 11.5–15.5)
WBC: 14.3 10*3/uL — ABNORMAL HIGH (ref 4.0–10.5)
nRBC: 0 % (ref 0.0–0.2)

## 2019-04-14 LAB — COMPREHENSIVE METABOLIC PANEL
ALT: 26 U/L (ref 0–44)
AST: 22 U/L (ref 15–41)
Albumin: 3 g/dL — ABNORMAL LOW (ref 3.5–5.0)
Alkaline Phosphatase: 57 U/L (ref 38–126)
Anion gap: 10 (ref 5–15)
BUN: 14 mg/dL (ref 8–23)
CO2: 28 mmol/L (ref 22–32)
Calcium: 8.3 mg/dL — ABNORMAL LOW (ref 8.9–10.3)
Chloride: 100 mmol/L (ref 98–111)
Creatinine, Ser: 0.88 mg/dL (ref 0.61–1.24)
GFR calc Af Amer: >60 mL/min (ref >60)
GFR calc non Af Amer: >60 mL/min (ref >60)
Glucose, Bld: 202 mg/dL — ABNORMAL HIGH (ref 70–99)
Potassium: 3.5 mmol/L (ref 3.5–5.1)
Sodium: 138 mmol/L (ref 135–145)
Total Bilirubin: 1 mg/dL (ref 0.3–1.2)
Total Protein: 7.4 g/dL (ref 6.5–8.1)

## 2019-04-14 LAB — HEMOGLOBIN A1C
Hgb A1c MFr Bld: 6.8 % — ABNORMAL HIGH (ref 4.8–5.6)
Mean Plasma Glucose: 148.46 mg/dL

## 2019-04-14 MED ORDER — METFORMIN HCL ER 500 MG PO TB24
500.0000 mg | ORAL_TABLET | Freq: Every day | ORAL | Status: DC
  Administered 2019-04-14 – 2019-04-16 (×3): 500 mg via ORAL
  Filled 2019-04-14 (×3): qty 1

## 2019-04-14 NOTE — Progress Notes (Signed)
PROGRESS NOTE    Mark Schroeder  ZOX:[id number] DOB: [birth year] DOA: 04/13/2019 PCP: [facility]    Brief Narrative:  74 year old male with history of COPD, diabetes, right below-knee amputation, hypertension, admitted to the hospital with shortness of breath.  Found to have pneumonia and COPD exacerbation.  Treated with intravenous antibiotics and bronchodilators.   Assessment & Plan:   Principal Problem:   CAP (community acquired pneumonia) Active Problems:   COPD with exacerbation (HCC)   Hypokalemia   Normocytic anemia   Type 2 diabetes mellitus (HCC)   Hypertension   Hyperbilirubinemia   Depression   PTSD (post-traumatic stress disorder)   Diabetic peripheral neuropathy (HCC)   Hyperlipidemia   1. Community-acquired pneumonia.  Currently on ceftriaxone and azithromycin.  Still has dyspnea on exertion and cough.  Oxygen as tolerated.  Patient's wife will bring in his prosthesis later today and he can hopefully ambulate in the hall.  He normally ambulates with a cane. 2. COPD exacerbation.  Continue on bronchodilators.  He is not wheezing at this time.  He did receive 1 dose of Solu-Medrol in the emergency room.  Hold off on further steroids for now. 3. Hypokalemia.  Replace 4. Diabetes.  Currently on sliding scale insulin.  He is also on Metformin.  Blood sugars currently stable 5. Hypertension.  Continue carvedilol and hydrate.  Blood pressures currently stable.    DVT prophylaxis: Lovenox Code Status: Full code Family Communication: Discussed with patient Disposition Plan: Discharge home once shortness of breath has improved and is able to come off of oxygen.  Presently, he still short of breath and is on supplemental oxygen.   Consultants:     Procedures:     Antimicrobials:   Ceftriaxone 2/19 >  Azithromycin 2/19 >   Subjective: Continues to have dyspnea on exertion, has productive cough  Objective: Vitals:   04/13/19 2221 04/14/19  0630 04/14/19 0820 04/14/19 1350  BP: 135/69 111/66 113/70 138/76  Pulse: 88 78 84 86  Resp: 20 18  19   Temp: 98.1 F (36.7 C) 97.8 F (36.6 C)  98.1 F (36.7 C)  TempSrc: Oral Oral  Oral  SpO2: 100% 92%  96%  Weight: 78.7 kg     Height: 5\' 5"  (1.651 m)       Intake/Output Summary (Last 24 hours) at 04/14/2019 1913 Last data filed at 04/14/2019 1300 Gross per 24 hour  Intake 1582.37 ml  Output 400 ml  Net 1182.37 ml   Filed Weights   04/13/19 1614 04/13/19 2221  Weight: 68 kg 78.7 kg    Examination:  General exam: Appears calm and comfortable  Respiratory system: Bilateral rhonchi. Respiratory effort normal. Cardiovascular system: S1 & S2 heard, RRR. No JVD, murmurs, rubs, gallops or clicks.  Gastrointestinal system: Abdomen is nondistended, soft and nontender. No organomegaly or masses felt. Normal bowel sounds heard. Central nervous system: Alert and oriented. No focal neurological deficits. Extremities: Right below the knee amputation Skin: No rashes, lesions or ulcers Psychiatry: Judgement and insight appear normal. Mood & affect appropriate.     Data Reviewed: I have personally reviewed following labs and imaging studies  CBC: Recent Labs  Lab 04/13/19 1644 04/14/19 0556  WBC 15.0* 14.3*  NEUTROABS 12.0* 13.2*  HGB 12.4* 11.3*  HCT 38.8* 35.7*  MCV 94.9 95.2  PLT 409* 401*   Basic Metabolic Panel: Recent Labs  Lab 04/13/19 1644 04/14/19 0556  NA 140 138  K 3.0* 3.5  CL 96* 100  CO2  31 28  GLUCOSE 112* 202*  BUN 12 14  CREATININE 1.06 0.88  CALCIUM 9.0 8.3*  MG 2.7*  --   PHOS 3.7  --    GFR: Estimated Creatinine Clearance: 73.4 mL/min (by C-G formula based on SCr of 0.88 mg/dL). Liver Function Tests: Recent Labs  Lab 04/13/19 1644 04/14/19 0556  AST 26 22  ALT 28 26  ALKPHOS 62 57  BILITOT 1.5* 1.0  PROT 8.4* 7.4  ALBUMIN 3.6 3.0*   No results for input(s): LIPASE, AMYLASE in the last 168 hours. No results for input(s): AMMONIA in  the last 168 hours. Coagulation Profile: No results for input(s): INR, PROTIME in the last 168 hours. Cardiac Enzymes: No results for input(s): CKTOTAL, CKMB, CKMBINDEX, TROPONINI in the last 168 hours. BNP (last 3 results) No results for input(s): PROBNP in the last 8760 hours. HbA1C: Recent Labs    04/14/19 0556  HGBA1C 6.8*   CBG: Recent Labs  Lab 04/14/19 0021 04/14/19 0428 04/14/19 0731 04/14/19 1153 04/14/19 1701  GLUCAP 157* 163* 167* 133* 157*   Lipid Profile: No results for input(s): CHOL, HDL, LDLCALC, TRIG, CHOLHDL, LDLDIRECT in the last 72 hours. Thyroid Function Tests: No results for input(s): TSH, T4TOTAL, FREET4, T3FREE, THYROIDAB in the last 72 hours. Anemia Panel: No results for input(s): VITAMINB12, FOLATE, FERRITIN, TIBC, IRON, RETICCTPCT in the last 72 hours. Sepsis Labs: Recent Labs  Lab 04/13/19 1644 04/13/19 1900  LATICACIDVEN 1.5 0.9    Recent Results (from the past 240 hour(s))  Blood culture (routine x 2)     Status: None (Preliminary result)   Collection Time: 04/13/19  4:44 PM   Specimen: Left Antecubital; Blood  Result Value Ref Range Status   Specimen Description LEFT ANTECUBITAL  Final   Special Requests   Final    BOTTLES DRAWN AEROBIC AND ANAEROBIC Blood Culture adequate volume   Culture   Final    NO GROWTH < 24 HOURS Performed at [facility], [address], [place], [place] [address]    Report Status PENDING  Incomplete  Blood culture (routine x 2)     Status: None (Preliminary result)   Collection Time: 04/13/19  4:44 PM   Specimen: BLOOD LEFT FOREARM  Result Value Ref Range Status   Specimen Description BLOOD LEFT FOREARM  Final   Special Requests   Final    BOTTLES DRAWN AEROBIC AND ANAEROBIC Blood Culture results may not be optimal due to an excessive volume of blood received in culture bottles   Culture   Final    NO GROWTH < 24 HOURS Performed at [facility], [address], [place], [place] [address]    Report  Status PENDING  Incomplete  Respiratory Panel by RT PCR (Flu A&B, Covid) - Nasopharyngeal Swab     Status: None   Collection Time: 04/13/19  5:34 PM   Specimen: Nasopharyngeal Swab  Result Value Ref Range Status   SARS Coronavirus 2 by RT PCR NEGATIVE NEGATIVE Final    Comment: (NOTE) SARS-CoV-2 target nucleic acids are NOT DETECTED. The SARS-CoV-2 RNA is generally detectable in upper respiratoy specimens during the acute phase of infection. The lowest concentration of SARS-CoV-2 viral copies this assay can detect is 131 copies/mL. A negative result does not preclude SARS-Cov-2 infection and should not be used as the sole basis for treatment or other patient management decisions. A negative result may occur with  improper specimen collection/handling, submission of specimen other than nasopharyngeal swab, presence of viral mutation(s) within  the areas targeted by this assay, and inadequate number of viral copies (<131 copies/mL). A negative result must be combined with clinical observations, patient history, and epidemiological information. The expected result is Negative. Fact Sheet for Patients:  https://www.moore.com/ Fact Sheet for Healthcare Providers:  https://www.young.biz/ This test is not yet ap proved or cleared by the [place] FDA and  has been authorized for detection and/or diagnosis of SARS-CoV-2 by FDA under an Emergency Use Authorization (EUA). This EUA will remain  in effect (meaning this test can be used) for the duration of the COVID-19 declaration under Section 564(b)(1) of the Act, 21 U.S.C. section 360bbb-3(b)(1), unless the authorization is terminated or revoked sooner.    Influenza A by PCR NEGATIVE NEGATIVE Final   Influenza B by PCR NEGATIVE NEGATIVE Final    Comment: (NOTE) The Xpert Xpress SARS-CoV-2/FLU/RSV assay is intended as an aid in  the diagnosis of influenza from Nasopharyngeal swab specimens and   should not be used as a sole basis for treatment. Nasal washings and  aspirates are unacceptable for Xpert Xpress SARS-CoV-2/FLU/RSV  testing. Fact Sheet for Patients: https://www.moore.com/ Fact Sheet for Healthcare Providers: https://www.young.biz/ This test is not yet approved or cleared by the [place] FDA and  has been authorized for detection and/or diagnosis of SARS-CoV-2 by  FDA under an Emergency Use Authorization (EUA). This EUA will remain  in effect (meaning this test can be used) for the duration of the  Covid-19 declaration under Section 564(b)(1) of the Act, 21  U.S.C. section 360bbb-3(b)(1), unless the authorization is  terminated or revoked. Performed at [facility], [address] [place], [place] [address]          Radiology Studies: DG Chest Portable 1 View  Result Date: 04/13/2019 CLINICAL DATA:  74 year old male with shortness of breath. EXAM: PORTABLE CHEST 1 VIEW COMPARISON:  Chest radiograph dated 08/15/2014. FINDINGS: There is background of emphysema. Patchy area of opacity in the right upper lobe most consistent with pneumonia. Clinical correlation and follow-up to resolution is recommended to exclude underlying mass. There is no pleural effusion or pneumothorax. The cardiac silhouette is within normal limits. No acute osseous pathology. IMPRESSION: Probable right upper lobe pneumonia. Clinical correlation and close follow-up recommended to document resolution. Electronically Signed   By: Elgie Collard M.D.   On: 04/13/2019 17:48        Scheduled Meds: . aspirin EC  81 mg Oral Daily  . atorvastatin  40 mg Oral QHS  . buPROPion  150 mg Oral BID  . carvedilol  12.5 mg Oral Q12H  . doxepin  100 mg Oral QHS  . enoxaparin (LOVENOX) injection  40 mg Subcutaneous Q24H  . gabapentin  300 mg Oral TID AC  . gabapentin  900 mg Oral QHS  . guaiFENesin  600 mg Oral BID  . insulin aspart  0-15 Units Subcutaneous  TID WC  . insulin aspart  0-5 Units Subcutaneous QHS  . ketorolac  15 mg Intravenous Once  . metFORMIN  500 mg Oral QAC supper  . pantoprazole  40 mg Oral BID  . QUEtiapine  50 mg Oral QHS  . terazosin  4 mg Oral QHS  . triamterene-hydrochlorothiazide  1 tablet Oral Daily   Continuous Infusions: . azithromycin    . cefTRIAXone (ROCEPHIN)  IV       LOS: 1 day    Time spent:    Erick Blinks, MD [facility]   If 7PM-7AM, please contact night-coverage [email or web address]  04/14/2019,  7:13 PM \

## 2019-04-15 LAB — GLUCOSE, CAPILLARY
Glucose-Capillary: 113 mg/dL — ABNORMAL HIGH (ref 70–99)
Glucose-Capillary: 118 mg/dL — ABNORMAL HIGH (ref 70–99)
Glucose-Capillary: 142 mg/dL — ABNORMAL HIGH (ref 70–99)
Glucose-Capillary: 81 mg/dL (ref 70–99)
Glucose-Capillary: 90 mg/dL (ref 70–99)

## 2019-04-15 LAB — CBC WITH DIFFERENTIAL/PLATELET
Abs Immature Granulocytes: 0.08 10*3/uL — ABNORMAL HIGH (ref 0.00–0.07)
Basophils Absolute: 0 10*3/uL (ref 0.0–0.1)
Basophils Relative: 0 %
Eosinophils Absolute: 0 10*3/uL (ref 0.0–0.5)
Eosinophils Relative: 0 %
HCT: 36.2 % — ABNORMAL LOW (ref 39.0–52.0)
Hemoglobin: 11.5 g/dL — ABNORMAL LOW (ref 13.0–17.0)
Immature Granulocytes: 1 %
Lymphocytes Relative: 13 %
Lymphs Abs: 1.7 10*3/uL (ref 0.7–4.0)
MCH: 30.2 pg (ref 26.0–34.0)
MCHC: 31.8 g/dL (ref 30.0–36.0)
MCV: 95 fL (ref 80.0–100.0)
Monocytes Absolute: 0.9 10*3/uL (ref 0.1–1.0)
Monocytes Relative: 6 %
Neutro Abs: 10.9 10*3/uL — ABNORMAL HIGH (ref 1.7–7.7)
Neutrophils Relative %: 80 %
Platelets: 447 10*3/uL — ABNORMAL HIGH (ref 150–400)
RBC: 3.81 MIL/uL — ABNORMAL LOW (ref 4.22–5.81)
RDW: 13 % (ref 11.5–15.5)
WBC: 13.6 10*3/uL — ABNORMAL HIGH (ref 4.0–10.5)
nRBC: 0 % (ref 0.0–0.2)

## 2019-04-15 LAB — COMPREHENSIVE METABOLIC PANEL
ALT: 32 U/L (ref 0–44)
AST: 28 U/L (ref 15–41)
Albumin: 2.8 g/dL — ABNORMAL LOW (ref 3.5–5.0)
Alkaline Phosphatase: 59 U/L (ref 38–126)
Anion gap: 10 (ref 5–15)
BUN: 16 mg/dL (ref 8–23)
CO2: 30 mmol/L (ref 22–32)
Calcium: 8.7 mg/dL — ABNORMAL LOW (ref 8.9–10.3)
Chloride: 99 mmol/L (ref 98–111)
Creatinine, Ser: 0.97 mg/dL (ref 0.61–1.24)
GFR calc Af Amer: >60 mL/min (ref >60)
GFR calc non Af Amer: >60 mL/min (ref >60)
Glucose, Bld: 114 mg/dL — ABNORMAL HIGH (ref 70–99)
Potassium: 3.8 mmol/L (ref 3.5–5.1)
Sodium: 139 mmol/L (ref 135–145)
Total Bilirubin: 0.3 mg/dL (ref 0.3–1.2)
Total Protein: 7.2 g/dL (ref 6.5–8.1)

## 2019-04-15 MED ORDER — LEVALBUTEROL HCL 0.63 MG/3ML IN NEBU
INHALATION_SOLUTION | RESPIRATORY_TRACT | Status: AC
  Filled 2019-04-15: qty 3

## 2019-04-15 MED ORDER — METHYLPREDNISOLONE SODIUM SUCC 125 MG IJ SOLR
60.0000 mg | Freq: Two times a day (BID) | INTRAMUSCULAR | Status: DC
  Administered 2019-04-15 – 2019-04-17 (×4): 60 mg via INTRAVENOUS
  Filled 2019-04-15 (×4): qty 2

## 2019-04-15 MED ORDER — HYDROCODONE-HOMATROPINE 5-1.5 MG/5ML PO SYRP: 5.0000 mL | ORAL_SOLUTION | ORAL | Status: DC | PRN

## 2019-04-15 MED ORDER — LEVALBUTEROL HCL 0.63 MG/3ML IN NEBU
0.6300 mg | INHALATION_SOLUTION | Freq: Once | RESPIRATORY_TRACT | Status: AC
  Administered 2019-04-15: 08:00:00 0.63 mg via RESPIRATORY_TRACT

## 2019-04-15 NOTE — Progress Notes (Signed)
PROGRESS NOTE    Mark Schroeder  HGD:[id number] DOB: 17-May-1945 DOA: 04/13/2019 PCP: [facility]    Brief Narrative:  74 year old male with history of COPD, diabetes, right below-knee amputation, hypertension, admitted to the hospital with shortness of breath.  Found to have pneumonia and COPD exacerbation.  Treated with intravenous antibiotics and bronchodilators.   Assessment & Plan:   Principal Problem:   CAP (community acquired pneumonia) Active Problems:   COPD with exacerbation (HCC)   Hypokalemia   Normocytic anemia   Type 2 diabetes mellitus (HCC)   Hypertension   Hyperbilirubinemia   Depression   PTSD (post-traumatic stress disorder)   Diabetic peripheral neuropathy (HCC)   Hyperlipidemia   1. Community-acquired pneumonia.  Currently on ceftriaxone and azithromycin.  Patient is still becoming dyspneic with minimal exertion.  Continue current treatments 2. COPD exacerbation.  Continue on bronchodilators.  He is not wheezing at this time.  He did receive 1 dose of Solu-Medrol in the emergency room.  Since he is still very dyspneic on minimal exertion, will add intravenous steroids. 3. Hypokalemia.  Replaced 4. Diabetes.  Currently on sliding scale insulin.  He is also on Metformin.  Blood sugars currently stable 5. Hypertension.  Continue carvedilol and hydrate.  Blood pressures currently stable.    DVT prophylaxis: Lovenox Code Status: Full code Family Communication: Discussed with patient Disposition Plan: Discharge home once shortness of breath has improved and is able to come off of oxygen.  Presently, he still short of breath and is on supplemental oxygen.   Consultants:     Procedures:     Antimicrobials:   Ceftriaxone 2/19 >  Azithromycin 2/19 >   Subjective: Patient complains of chest pain this morning.  Pain is described in the right upper chest which is worse with deep inspiration and cough.  Ambulated today with oxygen and  became very dyspneic.  Objective: Vitals:   04/15/19 0749 04/15/19 0819 04/15/19 1308 04/15/19 1401  BP: (!) 155/77   124/63  Pulse: (!) 101   (!) 102  Resp: 20   19  Temp: 99.6 F (37.6 C)   97.8 F (36.6 C)  TempSrc: Oral   Oral  SpO2: 94% 94% 94% 98%  Weight:      Height:        Intake/Output Summary (Last 24 hours) at 04/15/2019 1849 Last data filed at 04/15/2019 1700 Gross per 24 hour  Intake 1990 ml  Output 2800 ml  Net -810 ml   Filed Weights   04/13/19 1614 04/13/19 2221  Weight: 68 kg 78.7 kg    Examination:  General exam: Alert, awake, oriented x 3 Respiratory system: Clear to auscultation. Respiratory effort normal. Cardiovascular system:RRR. No murmurs, rubs, gallops. Gastrointestinal system: Abdomen is nondistended, soft and nontender. No organomegaly or masses felt. Normal bowel sounds heard. Central nervous system: Alert and oriented. No focal neurological deficits. Extremities: Right below-knee amputation Skin: No rashes, lesions or ulcers Psychiatry: Judgement and insight appear normal. Mood & affect appropriate.    Data Reviewed: I have personally reviewed following labs and imaging studies  CBC: Recent Labs  Lab 04/13/19 1644 04/14/19 0556 04/15/19 0556  WBC 15.0* 14.3* 13.6*  NEUTROABS 12.0* 13.2* 10.9*  HGB 12.4* 11.3* 11.5*  HCT 38.8* 35.7* 36.2*  MCV 94.9 95.2 95.0  PLT 409* 401* 447*   Basic Metabolic Panel: Recent Labs  Lab 04/13/19 1644 04/14/19 0556 04/15/19 0556  NA 140 138 139  K 3.0* 3.5 3.8  CL 96*  100 99  CO2 31 28 30   GLUCOSE 112* 202* 114*  BUN 12 14 16   CREATININE 1.06 0.88 0.97  CALCIUM 9.0 8.3* 8.7*  MG 2.7*  --   --   PHOS 3.7  --   --    GFR: Estimated Creatinine Clearance: 66.6 mL/min (by C-G formula based on SCr of 0.97 mg/dL). Liver Function Tests: Recent Labs  Lab 04/13/19 1644 04/14/19 0556 04/15/19 0556  AST 26 22 28   ALT 28 26 32  ALKPHOS 62 57 59  BILITOT 1.5* 1.0 0.3  PROT 8.4* 7.4 7.2   ALBUMIN 3.6 3.0* 2.8*   No results for input(s): LIPASE, AMYLASE in the last 168 hours. No results for input(s): AMMONIA in the last 168 hours. Coagulation Profile: No results for input(s): INR, PROTIME in the last 168 hours. Cardiac Enzymes: No results for input(s): CKTOTAL, CKMB, CKMBINDEX, TROPONINI in the last 168 hours. BNP (last 3 results) No results for input(s): PROBNP in the last 8760 hours. HbA1C: Recent Labs    04/14/19 0556  HGBA1C 6.8*   CBG: Recent Labs  Lab 04/14/19 2143 04/15/19 0052 04/15/19 0745 04/15/19 1202 04/15/19 1541  GLUCAP 109* 118* 90 142* 81   Lipid Profile: No results for input(s): CHOL, HDL, LDLCALC, TRIG, CHOLHDL, LDLDIRECT in the last 72 hours. Thyroid Function Tests: No results for input(s): TSH, T4TOTAL, FREET4, T3FREE, THYROIDAB in the last 72 hours. Anemia Panel: No results for input(s): VITAMINB12, FOLATE, FERRITIN, TIBC, IRON, RETICCTPCT in the last 72 hours. Sepsis Labs: Recent Labs  Lab 04/13/19 1644 04/13/19 1900  LATICACIDVEN 1.5 0.9    Recent Results (from the past 240 hour(s))  Blood culture (routine x 2)     Status: None (Preliminary result)   Collection Time: 04/13/19  4:44 PM   Specimen: Left Antecubital; Blood  Result Value Ref Range Status   Specimen Description LEFT ANTECUBITAL  Final   Special Requests   Final    BOTTLES DRAWN AEROBIC AND ANAEROBIC Blood Culture adequate volume   Culture   Final    NO GROWTH 2 DAYS Performed at [facility], [address] [place], [facility] [address]    Report Status PENDING  Incomplete  Blood culture (routine x 2)     Status: None (Preliminary result)   Collection Time: 04/13/19  4:44 PM   Specimen: BLOOD LEFT FOREARM  Result Value Ref Range Status   Specimen Description BLOOD LEFT FOREARM  Final   Special Requests   Final    BOTTLES DRAWN AEROBIC AND ANAEROBIC Blood Culture results may not be optimal due to an excessive volume of blood received in culture bottles    Culture   Final    NO GROWTH 2 DAYS Performed at [facility], [address], [place], [facility] [address]    Report Status PENDING  Incomplete  Respiratory Panel by RT PCR (Flu A&B, Covid) - Nasopharyngeal Swab     Status: None   Collection Time: 04/13/19  5:34 PM   Specimen: Nasopharyngeal Swab  Result Value Ref Range Status   SARS Coronavirus 2 by RT PCR NEGATIVE NEGATIVE Final    Comment: (NOTE) SARS-CoV-2 target nucleic acids are NOT DETECTED. The SARS-CoV-2 RNA is generally detectable in upper respiratoy specimens during the acute phase of infection. The lowest concentration of SARS-CoV-2 viral copies this assay can detect is 131 copies/mL. A negative result does not preclude SARS-Cov-2 infection and should not be used as the sole basis for treatment or other patient management decisions. A  negative result may occur with  improper specimen collection/handling, submission of specimen other than nasopharyngeal swab, presence of viral mutation(s) within the areas targeted by this assay, and inadequate number of viral copies (<131 copies/mL). A negative result must be combined with clinical observations, patient history, and epidemiological information. The expected result is Negative. Fact Sheet for Patients:  PinkCheek.be Fact Sheet for Healthcare Providers:  GravelBags.it This test is not yet ap proved or cleared by the [place] FDA and  has been authorized for detection and/or diagnosis of SARS-CoV-2 by FDA under an Emergency Use Authorization (EUA). This EUA will remain  in effect (meaning this test can be used) for the duration of the COVID-19 declaration under Section 564(b)(1) of the Act, 21 U.S.C. section 360bbb-3(b)(1), unless the authorization is terminated or revoked sooner.    Influenza A by PCR NEGATIVE NEGATIVE Final   Influenza B by PCR NEGATIVE NEGATIVE Final    Comment: (NOTE) The Xpert Xpress  SARS-CoV-2/FLU/RSV assay is intended as an aid in  the diagnosis of influenza from Nasopharyngeal swab specimens and  should not be used as a sole basis for treatment. Nasal washings and  aspirates are unacceptable for Xpert Xpress SARS-CoV-2/FLU/RSV  testing. Fact Sheet for Patients: PinkCheek.be Fact Sheet for Healthcare Providers: GravelBags.it This test is not yet approved or cleared by the [place] FDA and  has been authorized for detection and/or diagnosis of SARS-CoV-2 by  FDA under an Emergency Use Authorization (EUA). This EUA will remain  in effect (meaning this test can be used) for the duration of the  Covid-19 declaration under Section 564(b)(1) of the Act, 21  U.S.C. section 360bbb-3(b)(1), unless the authorization is  terminated or revoked. Performed at [facility], [address], [place], [place] [address]          Radiology Studies: No results found.      Scheduled Meds:  aspirin EC  81 mg Oral Daily   atorvastatin  40 mg Oral QHS   buPROPion  150 mg Oral BID   carvedilol  12.5 mg Oral Q12H   doxepin  100 mg Oral QHS   enoxaparin (LOVENOX) injection  40 mg Subcutaneous Q24H   gabapentin  300 mg Oral TID AC   gabapentin  900 mg Oral QHS   guaiFENesin  600 mg Oral BID   insulin aspart  0-15 Units Subcutaneous TID WC   insulin aspart  0-5 Units Subcutaneous QHS   ketorolac  15 mg Intravenous Once   metFORMIN  500 mg Oral QAC supper   pantoprazole  40 mg Oral BID   QUEtiapine  50 mg Oral QHS   terazosin  4 mg Oral QHS   triamterene-hydrochlorothiazide  1 tablet Oral Daily   Continuous Infusions:  azithromycin 500 mg (04/14/19 2027)   cefTRIAXone (ROCEPHIN)  IV 2 g (04/14/19 2146)     LOS: 2 days    Time spent: 58mins    Kathie Dike, MD [facility]   If 7PM-7AM, please contact night-coverage [email or web address]  04/15/2019, 6:49 PM \

## 2019-04-16 LAB — GLUCOSE, CAPILLARY
Glucose-Capillary: 153 mg/dL — ABNORMAL HIGH (ref 70–99)
Glucose-Capillary: 140 mg/dL — ABNORMAL HIGH (ref 70–99)
Glucose-Capillary: 155 mg/dL — ABNORMAL HIGH (ref 70–99)
Glucose-Capillary: 151 mg/dL — ABNORMAL HIGH (ref 70–99)
Glucose-Capillary: 126 mg/dL — ABNORMAL HIGH (ref 70–99)

## 2019-04-16 NOTE — Progress Notes (Signed)
SATURATION QUALIFICATIONS: (This note is used to comply with regulatory documentation for home oxygen)  Patient Saturations on Room Air at Rest = 94%  Patient Saturations on Room Air while Ambulating = 87%  Patient Saturations on 2 Liters of oxygen while Ambulating = 95%

## 2019-04-16 NOTE — TOC Progression Note (Signed)
Transition of Care [facility]) - Progression Note    Patient Details  Name: Mark Schroeder MRN: [id number] Date of Birth: 1945/11/24  [facility]) CM/SW Contact  Leitha Bleak, RN Phone Number: 04/16/2019, 2:45 PM  Clinical Narrative:    [facility] notification emailed. TOC watching for Home oxygen needs.

## 2019-04-16 NOTE — Progress Notes (Signed)
PROGRESS NOTE    Mark Schroeder  ZOX:[id number] DOB: Jun 07, 1945 DOA: 04/13/2019 [facility]: [facility]    Brief Narrative:  74 year old male with history of COPD, diabetes, right below-knee amputation, hypertension, admitted to the hospital with shortness of breath.  Found to have pneumonia and COPD exacerbation.  Treated with intravenous antibiotics and bronchodilators.   Assessment & Plan:   Principal Problem:   CAP (community acquired pneumonia) Active Problems:   COPD with exacerbation (HCC)   Hypokalemia   Normocytic anemia   Type 2 diabetes mellitus (HCC)   Hypertension   Hyperbilirubinemia   Depression   PTSD (post-traumatic stress disorder)   Diabetic peripheral neuropathy (HCC)   Hyperlipidemia   1. Community-acquired pneumonia.  Currently on ceftriaxone and azithromycin.  Patient is still becoming dyspneic with minimal exertion.  Continue current treatments--- ambulated today and patient desaturated,, dyspnea on exertion persist 2. COPD exacerbation.  Continue on bronchodilators.  He is not wheezing at this time.  He did receive 1 dose of Solu-Medrol in the emergency room.  Since he is still very dyspneic on minimal exertion, c/n steroids. 3. Hypokalemia.  Replaced 4. Diabetes.  Currently on sliding scale insulin.  He is also on Metformin.  Blood sugars currently stable 5. Hypertension.  Continue carvedilol and hydrate.  Blood pressures currently stable.    DVT prophylaxis: Lovenox Code Status: Full code Family Communication: Discussed with patient and wife at bedside Disposition Plan: Discharge home once shortness of breath has improved and is able to come off of oxygen.  Presently, he still short of breath and is on supplemental oxygen- ambulated today and patient desaturated,, dyspnea on exertion persist   Consultants:     Procedures:     Antimicrobials:   Ceftriaxone 2/19 >  Azithromycin 2/19 >   Subjective: -Wife at bedside  ambulated  today and patient desaturated,, dyspnea on exertion persist  Objective: Vitals:   04/16/19 0429 04/16/19 0927 04/16/19 1020 04/16/19 1413  BP: 130/70 (!) 146/77  (!) 141/75  Pulse: 80 88 81 88  Resp: 20 17  18   Temp: 98.7 F (37.1 C)     TempSrc: Oral     SpO2: 92% 94%  95%  Weight:      Height:        Intake/Output Summary (Last 24 hours) at 04/16/2019 1858 Last data filed at 04/16/2019 1700 Gross per 24 hour  Intake 450 ml  Output 1800 ml  Net -1350 ml   Filed Weights   04/13/19 1614 04/13/19 2221  Weight: 68 kg 78.7 kg    Examination:  General exam: Alert, awake, oriented x 3, dyspnea on exertion Respiratory system: Diminished breath sounds, few scattered rhonchi, no wheezing  cardiovascular system:RRR. No murmurs, rubs, gallops. Gastrointestinal system: Abdomen is nondistended, soft and nontender.  . Normal bowel sounds heard. Central nervous system: Alert and oriented. No focal neurological deficits. Extremities: Right below-knee amputation Skin: No rashes, lesions or ulcers Psychiatry: Judgement and insight appear normal. Mood & affect appropriate.    Data Reviewed:  CBC: Recent Labs  Lab 04/13/19 1644 04/14/19 0556 04/15/19 0556  WBC 15.0* 14.3* 13.6*  NEUTROABS 12.0* 13.2* 10.9*  HGB 12.4* 11.3* 11.5*  HCT 38.8* 35.7* 36.2*  MCV 94.9 95.2 95.0  PLT 409* 401* 447*   Basic Metabolic Panel: Recent Labs  Lab 04/13/19 1644 04/14/19 0556 04/15/19 0556  NA 140 138 139  K 3.0* 3.5 3.8  CL 96* 100 99  CO2 31 28 30   GLUCOSE 112*  202* 114*  BUN 12 14 16   CREATININE 1.06 0.88 0.97  CALCIUM 9.0 8.3* 8.7*  MG 2.7*  --   --   PHOS 3.7  --   --    GFR: Estimated Creatinine Clearance: 66.6 mL/min (by C-G formula based on SCr of 0.97 mg/dL). Liver Function Tests: Recent Labs  Lab 04/13/19 1644 04/14/19 0556 04/15/19 0556  AST 26 22 28   ALT 28 26 32  ALKPHOS 62 57 59  BILITOT 1.5* 1.0 0.3  PROT 8.4* 7.4 7.2  ALBUMIN 3.6 3.0* 2.8*   No results  for input(s): LIPASE, AMYLASE in the last 168 hours. No results for input(s): AMMONIA in the last 168 hours. Coagulation Profile: No results for input(s): INR, PROTIME in the last 168 hours. Cardiac Enzymes: No results for input(s): CKTOTAL, CKMB, CKMBINDEX, TROPONINI in the last 168 hours. BNP (last 3 results) No results for input(s): PROBNP in the last 8760 hours. HbA1C: Recent Labs    04/14/19 0556  HGBA1C 6.8*   CBG: Recent Labs  Lab 04/15/19 1541 04/15/19 2119 04/16/19 0740 04/16/19 1146 04/16/19 1603  GLUCAP 81 113* 140* 155* 151*   Lipid Profile: No results for input(s): CHOL, HDL, LDLCALC, TRIG, CHOLHDL, LDLDIRECT in the last 72 hours. Thyroid Function Tests: No results for input(s): TSH, T4TOTAL, FREET4, T3FREE, THYROIDAB in the last 72 hours. Anemia Panel: No results for input(s): VITAMINB12, FOLATE, FERRITIN, TIBC, IRON, RETICCTPCT in the last 72 hours. Sepsis Labs: Recent Labs  Lab 04/13/19 1644 04/13/19 1900  LATICACIDVEN 1.5 0.9    Recent Results (from the past 240 hour(s))  Blood culture (routine x 2)     Status: None (Preliminary result)   Collection Time: 04/13/19  4:44 PM   Specimen: Left Antecubital; Blood  Result Value Ref Range Status   Specimen Description LEFT ANTECUBITAL  Final   Special Requests   Final    BOTTLES DRAWN AEROBIC AND ANAEROBIC Blood Culture adequate volume   Culture   Final    NO GROWTH 3 DAYS Performed at [facility], [address] [place], [address] [place]    Report Status PENDING  Incomplete  Blood culture (routine x 2)     Status: None (Preliminary result)   Collection Time: 04/13/19  4:44 PM   Specimen: BLOOD LEFT FOREARM  Result Value Ref Range Status   Specimen Description BLOOD LEFT FOREARM  Final   Special Requests   Final    BOTTLES DRAWN AEROBIC AND ANAEROBIC Blood Culture results may not be optimal due to an excessive volume of blood received in culture bottles   Culture   Final    NO GROWTH 3  DAYS Performed at [facility] [address], [place], [address] [place]    Report Status PENDING  Incomplete  Respiratory Panel by RT PCR (Flu A&B, Covid) - Nasopharyngeal Swab     Status: None   Collection Time: 04/13/19  5:34 PM   Specimen: Nasopharyngeal Swab  Result Value Ref Range Status   SARS Coronavirus 2 by RT PCR NEGATIVE NEGATIVE Final    Comment: (NOTE) SARS-CoV-2 target nucleic acids are NOT DETECTED. The SARS-CoV-2 RNA is generally detectable in upper respiratoy specimens during the acute phase of infection. The lowest concentration of SARS-CoV-2 viral copies this assay can detect is 131 copies/mL. A negative result does not preclude SARS-Cov-2 infection and should not be used as the sole basis for treatment or other patient management decisions. A negative result may occur with  improper specimen collection/handling, submission  of specimen other than nasopharyngeal swab, presence of viral mutation(s) within the areas targeted by this assay, and inadequate number of viral copies (<131 copies/mL). A negative result must be combined with clinical observations, patient history, and epidemiological information. The expected result is Negative. Fact Sheet for Patients:  PinkCheek.be Fact Sheet for Healthcare Providers:  GravelBags.it This test is not yet ap proved or cleared by the [place] FDA and  has been authorized for detection and/or diagnosis of SARS-CoV-2 by FDA under an Emergency Use Authorization (EUA). This EUA will remain  in effect (meaning this test can be used) for the duration of the COVID-19 declaration under Section 564(b)(1) of the Act, 21 U.S.C. section 360bbb-3(b)(1), unless the authorization is terminated or revoked sooner.    Influenza A by PCR NEGATIVE NEGATIVE Final   Influenza B by PCR NEGATIVE NEGATIVE Final    Comment: (NOTE) The Xpert Xpress SARS-CoV-2/FLU/RSV assay is intended  as an aid in  the diagnosis of influenza from Nasopharyngeal swab specimens and  should not be used as a sole basis for treatment. Nasal washings and  aspirates are unacceptable for Xpert Xpress SARS-CoV-2/FLU/RSV  testing. Fact Sheet for Patients: PinkCheek.be Fact Sheet for Healthcare Providers: GravelBags.it This test is not yet approved or cleared by the [place] FDA and  has been authorized for detection and/or diagnosis of SARS-CoV-2 by  FDA under an Emergency Use Authorization (EUA). This EUA will remain  in effect (meaning this test can be used) for the duration of the  Covid-19 declaration under Section 564(b)(1) of the Act, 21  U.S.C. section 360bbb-3(b)(1), unless the authorization is  terminated or revoked. Performed at [facility], [address], [place], [place] [address]          Radiology Studies: No results found.      Scheduled Meds: . aspirin EC  81 mg Oral Daily  . atorvastatin  40 mg Oral QHS  . buPROPion  150 mg Oral BID  . carvedilol  12.5 mg Oral Q12H  . doxepin  100 mg Oral QHS  . enoxaparin (LOVENOX) injection  40 mg Subcutaneous Q24H  . gabapentin  300 mg Oral TID AC  . gabapentin  900 mg Oral QHS  . guaiFENesin  600 mg Oral BID  . insulin aspart  0-15 Units Subcutaneous TID WC  . insulin aspart  0-5 Units Subcutaneous QHS  . ketorolac  15 mg Intravenous Once  . metFORMIN  500 mg Oral QAC supper  . methylPREDNISolone (SOLU-MEDROL) injection  60 mg Intravenous Q12H  . pantoprazole  40 mg Oral BID  . QUEtiapine  50 mg Oral QHS  . terazosin  4 mg Oral QHS  . triamterene-hydrochlorothiazide  1 tablet Oral Daily   Continuous Infusions: . azithromycin 500 mg (04/15/19 2110)  . cefTRIAXone (ROCEPHIN)  IV 2 g (04/15/19 2026)     LOS: 3 days    Roxan Hockey, MD [facility]   If 7PM-7AM, please contact night-coverage [email or web address]  04/16/2019, 6:58 PM \

## 2019-04-17 LAB — GLUCOSE, CAPILLARY: Glucose-Capillary: 131 mg/dL — ABNORMAL HIGH (ref 70–99)

## 2019-04-17 MED ORDER — ASPIRIN EC 81 MG PO TBEC: 81.0000 mg | DELAYED_RELEASE_TABLET | Freq: Every day | ORAL | 2 refills | Status: DC

## 2019-04-17 MED ORDER — CEFDINIR 300 MG PO CAPS: 300.0000 mg | ORAL_CAPSULE | Freq: Two times a day (BID) | ORAL | 0 refills | Status: AC

## 2019-04-17 MED ORDER — TIOTROPIUM BROMIDE MONOHYDRATE 18 MCG IN CAPS: 18.0000 ug | ORAL_CAPSULE | Freq: Every day | RESPIRATORY_TRACT | 12 refills | Status: DC

## 2019-04-17 MED ORDER — PREDNISONE 20 MG PO TABS: 40.0000 mg | ORAL_TABLET | Freq: Every day | ORAL | 0 refills | Status: DC

## 2019-04-17 MED ORDER — MUCINEX 600 MG PO TB12: 600.0000 mg | ORAL_TABLET | Freq: Two times a day (BID) | ORAL | 0 refills | Status: AC

## 2019-04-17 MED ORDER — AZITHROMYCIN 500 MG PO TABS: 500.0000 mg | ORAL_TABLET | Freq: Every day | ORAL | 0 refills | Status: AC

## 2019-04-17 MED ORDER — ACETAMINOPHEN 325 MG PO TABS: 650.0000 mg | ORAL_TABLET | Freq: Four times a day (QID) | ORAL | 1 refills | Status: AC | PRN

## 2019-04-17 MED ORDER — METFORMIN HCL 500 MG PO TABS: 500.0000 mg | ORAL_TABLET | ORAL | 9 refills | Status: DC

## 2019-04-17 MED ORDER — BUDESONIDE-FORMOTEROL FUMARATE 160-4.5 MCG/ACT IN AERO: 2.0000 | INHALATION_SPRAY | Freq: Two times a day (BID) | RESPIRATORY_TRACT | 12 refills | Status: DC
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
## 2019-04-17 NOTE — Discharge Instructions (Signed)
1)Take Metformin 500 mg 1 tablet twice a day with food while on prednisone for additional 2 days after completing prednisone, may go back to 1 tablet with breakfast daily in about a week from now after completing prednisone 2) please take azithromycin, Omnicef, prednisone, Mucinex and other medications as prescribed 3) follow-[facility] with your [facility] provider for recheck and reevaluation in about a week or so

## 2019-04-17 NOTE — Progress Notes (Signed)
Bedside report completed, assumed care. Pt presents lying semi-fowlers, eyes closed, resp e/u, nad, no voiced c/o. Will con't to monitor.

## 2019-04-18 LAB — CULTURE, BLOOD (ROUTINE X 2)
Culture: NO GROWTH
Culture: NO GROWTH
Special Requests: ADEQUATE

## 2019-08-15 ENCOUNTER — Other Ambulatory Visit: Payer: Self-pay

## 2019-08-15 ENCOUNTER — Inpatient Hospital Stay (HOSPITAL_COMMUNITY)
Admission: EM | Admit: 2019-08-15 | Discharge: 2019-08-17 | DRG: 871 | Disposition: A | Discharge status: Home / Self Care | Payer: No Typology Code available for payment source | Attending: Internal Medicine | Admitting: Internal Medicine

## 2019-08-15 ENCOUNTER — Emergency Department (HOSPITAL_COMMUNITY): Payer: No Typology Code available for payment source

## 2019-08-15 ENCOUNTER — Encounter (HOSPITAL_COMMUNITY): Payer: Self-pay | Admitting: Emergency Medicine

## 2019-08-15 DIAGNOSIS — Z89511 Acquired absence of right leg below knee: Secondary | ICD-10-CM | POA: Diagnosis not present

## 2019-08-15 DIAGNOSIS — J470 Bronchiectasis with acute lower respiratory infection: Secondary | ICD-10-CM | POA: Diagnosis present

## 2019-08-15 DIAGNOSIS — E1142 Type 2 diabetes mellitus with diabetic polyneuropathy: Secondary | ICD-10-CM | POA: Diagnosis present

## 2019-08-15 DIAGNOSIS — E785 Hyperlipidemia, unspecified: Secondary | ICD-10-CM | POA: Diagnosis present

## 2019-08-15 DIAGNOSIS — E1159 Type 2 diabetes mellitus with other circulatory complications: Secondary | ICD-10-CM | POA: Diagnosis not present

## 2019-08-15 DIAGNOSIS — E876 Hypokalemia: Secondary | ICD-10-CM | POA: Diagnosis not present

## 2019-08-15 DIAGNOSIS — Z8701 Personal history of pneumonia (recurrent): Secondary | ICD-10-CM

## 2019-08-15 DIAGNOSIS — E119 Type 2 diabetes mellitus without complications: Secondary | ICD-10-CM

## 2019-08-15 DIAGNOSIS — J189 Pneumonia, unspecified organism: Secondary | ICD-10-CM | POA: Diagnosis present

## 2019-08-15 DIAGNOSIS — F329 Major depressive disorder, single episode, unspecified: Secondary | ICD-10-CM | POA: Diagnosis present

## 2019-08-15 DIAGNOSIS — Z8249 Family history of ischemic heart disease and other diseases of the circulatory system: Secondary | ICD-10-CM | POA: Diagnosis not present

## 2019-08-15 DIAGNOSIS — Z7951 Long term (current) use of inhaled steroids: Secondary | ICD-10-CM

## 2019-08-15 DIAGNOSIS — Z87891 Personal history of nicotine dependence: Secondary | ICD-10-CM | POA: Diagnosis not present

## 2019-08-15 DIAGNOSIS — F4310 Post-traumatic stress disorder, unspecified: Secondary | ICD-10-CM | POA: Diagnosis present

## 2019-08-15 DIAGNOSIS — Z79890 Hormone replacement therapy (postmenopausal): Secondary | ICD-10-CM

## 2019-08-15 DIAGNOSIS — Z7982 Long term (current) use of aspirin: Secondary | ICD-10-CM

## 2019-08-15 DIAGNOSIS — Z79899 Other long term (current) drug therapy: Secondary | ICD-10-CM

## 2019-08-15 DIAGNOSIS — Z7952 Long term (current) use of systemic steroids: Secondary | ICD-10-CM | POA: Diagnosis not present

## 2019-08-15 DIAGNOSIS — F32A Depression, unspecified: Secondary | ICD-10-CM | POA: Diagnosis present

## 2019-08-15 DIAGNOSIS — J441 Chronic obstructive pulmonary disease with (acute) exacerbation: Secondary | ICD-10-CM | POA: Diagnosis present

## 2019-08-15 DIAGNOSIS — Z7984 Long term (current) use of oral hypoglycemic drugs: Secondary | ICD-10-CM

## 2019-08-15 DIAGNOSIS — A419 Sepsis, unspecified organism: Principal | ICD-10-CM | POA: Diagnosis present

## 2019-08-15 DIAGNOSIS — J9601 Acute respiratory failure with hypoxia: Secondary | ICD-10-CM

## 2019-08-15 DIAGNOSIS — I10 Essential (primary) hypertension: Secondary | ICD-10-CM | POA: Diagnosis not present

## 2019-08-15 DIAGNOSIS — Z20822 Contact with and (suspected) exposure to covid-19: Secondary | ICD-10-CM | POA: Diagnosis present

## 2019-08-15 LAB — CBC WITH DIFFERENTIAL/PLATELET
Abs Immature Granulocytes: 0.01 10*3/uL (ref 0.00–0.07)
Basophils Absolute: 0 10*3/uL (ref 0.0–0.1)
Basophils Relative: 0 %
Eosinophils Absolute: 0 10*3/uL (ref 0.0–0.5)
Eosinophils Relative: 0 %
HCT: 41.7 % (ref 39.0–52.0)
Hemoglobin: 13.1 g/dL (ref 13.0–17.0)
Immature Granulocytes: 0 %
Lymphocytes Relative: 39 %
Lymphs Abs: 1.7 10*3/uL (ref 0.7–4.0)
MCH: 29.7 pg (ref 26.0–34.0)
MCHC: 31.4 g/dL (ref 30.0–36.0)
MCV: 94.6 fL (ref 80.0–100.0)
Monocytes Absolute: 0.4 10*3/uL (ref 0.1–1.0)
Monocytes Relative: 10 %
Neutro Abs: 2.3 10*3/uL (ref 1.7–7.7)
Neutrophils Relative %: 51 %
Platelets: 326 10*3/uL (ref 150–400)
RBC: 4.41 MIL/uL (ref 4.22–5.81)
RDW: 12.8 % (ref 11.5–15.5)
WBC: 4.5 10*3/uL (ref 4.0–10.5)
nRBC: 0 % (ref 0.0–0.2)

## 2019-08-15 LAB — COMPREHENSIVE METABOLIC PANEL
ALT: 25 U/L (ref 0–44)
AST: 34 U/L (ref 15–41)
Albumin: 3.7 g/dL (ref 3.5–5.0)
Alkaline Phosphatase: 55 U/L (ref 38–126)
Anion gap: 11 (ref 5–15)
BUN: 8 mg/dL (ref 8–23)
CO2: 29 mmol/L (ref 22–32)
Calcium: 8.4 mg/dL — ABNORMAL LOW (ref 8.9–10.3)
Chloride: 97 mmol/L — ABNORMAL LOW (ref 98–111)
Creatinine, Ser: 1.09 mg/dL (ref 0.61–1.24)
GFR calc Af Amer: >60 mL/min (ref >60)
GFR calc non Af Amer: >60 mL/min (ref >60)
Glucose, Bld: 116 mg/dL — ABNORMAL HIGH (ref 70–99)
Potassium: 2.9 mmol/L — ABNORMAL LOW (ref 3.5–5.1)
Sodium: 137 mmol/L (ref 135–145)
Total Bilirubin: 0.5 mg/dL (ref 0.3–1.2)
Total Protein: 7.6 g/dL (ref 6.5–8.1)

## 2019-08-15 LAB — CBG MONITORING, ED
Glucose-Capillary: 115 mg/dL — ABNORMAL HIGH (ref 70–99)
Glucose-Capillary: 186 mg/dL — ABNORMAL HIGH (ref 70–99)
Glucose-Capillary: 139 mg/dL — ABNORMAL HIGH (ref 70–99)

## 2019-08-15 LAB — FERRITIN: Ferritin: 59 ng/mL (ref 24–336)

## 2019-08-15 LAB — TROPONIN I (HIGH SENSITIVITY)
Troponin I (High Sensitivity): 5 ng/L (ref <18)
Troponin I (High Sensitivity): 5 ng/L (ref <18)

## 2019-08-15 LAB — MAGNESIUM: Magnesium: 1.6 mg/dL — ABNORMAL LOW (ref 1.7–2.4)

## 2019-08-15 LAB — PROCALCITONIN: Procalcitonin: <0.1 ng/mL

## 2019-08-15 LAB — HEMOGLOBIN A1C
Hgb A1c MFr Bld: 6.9 % — ABNORMAL HIGH (ref 4.8–5.6)
Mean Plasma Glucose: 151.33 mg/dL

## 2019-08-15 LAB — STREP PNEUMONIAE URINARY ANTIGEN: Strep Pneumo Urinary Antigen: NEGATIVE

## 2019-08-15 LAB — PHOSPHORUS: Phosphorus: 3 mg/dL (ref 2.5–4.6)

## 2019-08-15 LAB — C-REACTIVE PROTEIN: CRP: 0.6 mg/dL (ref <1.0)

## 2019-08-15 LAB — SARS CORONAVIRUS 2 BY RT PCR (HOSPITAL ORDER, PERFORMED IN ~~LOC~~ HOSPITAL LAB): SARS Coronavirus 2: NEGATIVE

## 2019-08-15 LAB — LACTATE DEHYDROGENASE: LDH: 209 U/L — ABNORMAL HIGH (ref 98–192)

## 2019-08-15 LAB — GLUCOSE, CAPILLARY: Glucose-Capillary: 129 mg/dL — ABNORMAL HIGH (ref 70–99)

## 2019-08-15 LAB — HIV ANTIBODY (ROUTINE TESTING W REFLEX): HIV Screen 4th Generation wRfx: NONREACTIVE

## 2019-08-15 LAB — FIBRINOGEN: Fibrinogen: 369 mg/dL (ref 210–475)

## 2019-08-15 LAB — D-DIMER, QUANTITATIVE: D-Dimer, Quant: 1 ug/mL-FEU — ABNORMAL HIGH (ref 0.00–0.50)

## 2019-08-15 LAB — TRIGLYCERIDES: Triglycerides: 122 mg/dL (ref <150)

## 2019-08-15 LAB — LACTIC ACID, PLASMA: Lactic Acid, Venous: 1.2 mmol/L (ref 0.5–1.9)

## 2019-08-15 MED ORDER — DOXEPIN HCL 25 MG PO CAPS
100.0000 mg | ORAL_CAPSULE | Freq: Every day | ORAL | Status: DC
  Administered 2019-08-15 – 2019-08-16 (×2): 100 mg via ORAL
  Filled 2019-08-15 (×4): qty 4

## 2019-08-15 MED ORDER — IOHEXOL 350 MG/ML SOLN
100.0000 mL | Freq: Once | INTRAVENOUS | Status: AC | PRN
  Administered 2019-08-15: 100 mL via INTRAVENOUS

## 2019-08-15 MED ORDER — ACETAMINOPHEN 325 MG PO TABS
650.0000 mg | ORAL_TABLET | Freq: Four times a day (QID) | ORAL | Status: DC | PRN
  Administered 2019-08-15: 650 mg via ORAL
  Filled 2019-08-15: qty 2

## 2019-08-15 MED ORDER — POTASSIUM CHLORIDE CRYS ER 20 MEQ PO TBCR
40.0000 meq | EXTENDED_RELEASE_TABLET | Freq: Once | ORAL | Status: AC
  Administered 2019-08-15: 40 meq via ORAL
  Filled 2019-08-15: qty 2

## 2019-08-15 MED ORDER — BUDESONIDE 0.5 MG/2ML IN SUSP
0.5000 mg | Freq: Two times a day (BID) | RESPIRATORY_TRACT | Status: DC
  Administered 2019-08-15 – 2019-08-17 (×5): 0.5 mg via RESPIRATORY_TRACT
  Filled 2019-08-15 (×5): qty 2

## 2019-08-15 MED ORDER — INSULIN ASPART 100 UNIT/ML ~~LOC~~ SOLN
0.0000 [IU] | Freq: Three times a day (TID) | SUBCUTANEOUS | Status: DC
  Administered 2019-08-15: 2 [IU] via SUBCUTANEOUS
  Administered 2019-08-15: 3 [IU] via SUBCUTANEOUS
  Administered 2019-08-16 (×2): 2 [IU] via SUBCUTANEOUS
  Administered 2019-08-17: 5 [IU] via SUBCUTANEOUS
  Administered 2019-08-17: 2 [IU] via SUBCUTANEOUS
  Filled 2019-08-15 (×2): qty 1

## 2019-08-15 MED ORDER — ASPIRIN EC 81 MG PO TBEC
81.0000 mg | DELAYED_RELEASE_TABLET | Freq: Every day | ORAL | Status: DC
  Administered 2019-08-15 – 2019-08-17 (×3): 81 mg via ORAL
  Filled 2019-08-15 (×3): qty 1

## 2019-08-15 MED ORDER — METHYLPREDNISOLONE SODIUM SUCC 125 MG IJ SOLR
60.0000 mg | Freq: Once | INTRAMUSCULAR | Status: AC
  Administered 2019-08-15: 60 mg via INTRAVENOUS
  Filled 2019-08-15: qty 2

## 2019-08-15 MED ORDER — ACETAMINOPHEN 650 MG RE SUPP: 650.0000 mg | Freq: Four times a day (QID) | RECTAL | Status: DC | PRN

## 2019-08-15 MED ORDER — BUPROPION HCL ER (SR) 150 MG PO TB12
150.0000 mg | ORAL_TABLET | Freq: Two times a day (BID) | ORAL | Status: DC
  Administered 2019-08-15 – 2019-08-17 (×5): 150 mg via ORAL
  Filled 2019-08-15 (×10): qty 1

## 2019-08-15 MED ORDER — ENOXAPARIN SODIUM 40 MG/0.4ML ~~LOC~~ SOLN
40.0000 mg | SUBCUTANEOUS | Status: DC
  Administered 2019-08-15 – 2019-08-17 (×3): 40 mg via SUBCUTANEOUS
  Filled 2019-08-15 (×3): qty 0.4

## 2019-08-15 MED ORDER — GABAPENTIN 300 MG PO CAPS
600.0000 mg | ORAL_CAPSULE | Freq: Every day | ORAL | Status: DC
  Administered 2019-08-15 – 2019-08-16 (×2): 600 mg via ORAL
  Filled 2019-08-15 (×3): qty 2

## 2019-08-15 MED ORDER — SODIUM CHLORIDE 0.9 % IV BOLUS
1000.0000 mL | Freq: Once | INTRAVENOUS | Status: AC
  Administered 2019-08-15: 1000 mL via INTRAVENOUS

## 2019-08-15 MED ORDER — SODIUM CHLORIDE 0.9 % IV SOLN
1.0000 g | INTRAVENOUS | Status: DC
  Administered 2019-08-16 – 2019-08-17 (×2): 1 g via INTRAVENOUS
  Filled 2019-08-15 (×2): qty 10

## 2019-08-15 MED ORDER — POTASSIUM CHLORIDE 10 MEQ/100ML IV SOLN
10.0000 meq | INTRAVENOUS | Status: AC
  Administered 2019-08-15 (×2): 10 meq via INTRAVENOUS
  Filled 2019-08-15 (×2): qty 100

## 2019-08-15 MED ORDER — SODIUM CHLORIDE 0.9 % IV SOLN
500.0000 mg | INTRAVENOUS | Status: DC
  Administered 2019-08-16 – 2019-08-17 (×2): 500 mg via INTRAVENOUS
  Filled 2019-08-15 (×2): qty 500

## 2019-08-15 MED ORDER — ENOXAPARIN SODIUM 40 MG/0.4ML ~~LOC~~ SOLN: 40.0000 mg | SUBCUTANEOUS | Status: DC

## 2019-08-15 MED ORDER — CARVEDILOL 12.5 MG PO TABS
12.5000 mg | ORAL_TABLET | Freq: Two times a day (BID) | ORAL | Status: DC
  Administered 2019-08-15 – 2019-08-17 (×5): 12.5 mg via ORAL
  Filled 2019-08-15 (×6): qty 1

## 2019-08-15 MED ORDER — METHYLPREDNISOLONE SODIUM SUCC 40 MG IJ SOLR
40.0000 mg | Freq: Two times a day (BID) | INTRAMUSCULAR | Status: DC
  Administered 2019-08-15 – 2019-08-17 (×5): 40 mg via INTRAVENOUS
  Filled 2019-08-15 (×5): qty 1

## 2019-08-15 MED ORDER — ONDANSETRON HCL 4 MG/2ML IJ SOLN: 4.0000 mg | Freq: Four times a day (QID) | INTRAMUSCULAR | Status: DC | PRN

## 2019-08-15 MED ORDER — SODIUM CHLORIDE 0.9 % IV SOLN
500.0000 mg | Freq: Once | INTRAVENOUS | Status: AC
  Administered 2019-08-15: 500 mg via INTRAVENOUS
  Filled 2019-08-15: qty 500

## 2019-08-15 MED ORDER — MAGNESIUM SULFATE 2 GM/50ML IV SOLN
2.0000 g | Freq: Once | INTRAVENOUS | Status: AC
  Administered 2019-08-15: 2 g via INTRAVENOUS
  Filled 2019-08-15: qty 50

## 2019-08-15 MED ORDER — ACETAMINOPHEN 325 MG PO TABS
650.0000 mg | ORAL_TABLET | Freq: Once | ORAL | Status: AC
  Administered 2019-08-15: 650 mg via ORAL
  Filled 2019-08-15: qty 2

## 2019-08-15 MED ORDER — TERAZOSIN HCL 1 MG PO CAPS
2.0000 mg | ORAL_CAPSULE | Freq: Every day | ORAL | Status: DC
  Administered 2019-08-15 – 2019-08-16 (×2): 2 mg via ORAL
  Filled 2019-08-15 (×5): qty 2

## 2019-08-15 MED ORDER — PANTOPRAZOLE SODIUM 40 MG PO TBEC
40.0000 mg | DELAYED_RELEASE_TABLET | Freq: Two times a day (BID) | ORAL | Status: DC
  Administered 2019-08-15 – 2019-08-17 (×5): 40 mg via ORAL
  Filled 2019-08-15 (×5): qty 1

## 2019-08-15 MED ORDER — INSULIN DETEMIR 100 UNIT/ML ~~LOC~~ SOLN
10.0000 [IU] | Freq: Every day | SUBCUTANEOUS | Status: DC
  Administered 2019-08-15 – 2019-08-16 (×2): 10 [IU] via SUBCUTANEOUS
  Filled 2019-08-15 (×5): qty 0.1

## 2019-08-15 MED ORDER — ONDANSETRON HCL 4 MG PO TABS: 4.0000 mg | ORAL_TABLET | Freq: Four times a day (QID) | ORAL | Status: DC | PRN

## 2019-08-15 MED ORDER — SODIUM CHLORIDE 0.9 % IV BOLUS (SEPSIS): 1.0000 mL | Freq: Once | INTRAVENOUS | Status: DC

## 2019-08-15 MED ORDER — ACETAMINOPHEN 325 MG PO TABS: 650.0000 mg | ORAL_TABLET | Freq: Four times a day (QID) | ORAL | Status: DC | PRN

## 2019-08-15 MED ORDER — POTASSIUM CHLORIDE IN NACL 20-0.9 MEQ/L-% IV SOLN
INTRAVENOUS | Status: DC
  Filled 2019-08-15: qty 1000

## 2019-08-15 MED ORDER — SODIUM CHLORIDE 0.9 % IV SOLN
1.0000 g | Freq: Once | INTRAVENOUS | Status: AC
  Administered 2019-08-15: 1 g via INTRAVENOUS
  Filled 2019-08-15: qty 10

## 2019-08-15 MED ORDER — LORATADINE 10 MG PO TABS
10.0000 mg | ORAL_TABLET | Freq: Every day | ORAL | Status: DC
  Administered 2019-08-15 – 2019-08-17 (×3): 10 mg via ORAL
  Filled 2019-08-15 (×3): qty 1

## 2019-08-15 MED ORDER — IPRATROPIUM-ALBUTEROL 0.5-2.5 (3) MG/3ML IN SOLN
3.0000 mL | Freq: Four times a day (QID) | RESPIRATORY_TRACT | Status: DC
  Administered 2019-08-15 – 2019-08-16 (×6): 3 mL via RESPIRATORY_TRACT
  Filled 2019-08-15 (×6): qty 3

## 2019-08-15 MED ORDER — INSULIN ASPART 100 UNIT/ML ~~LOC~~ SOLN: 0.0000 [IU] | Freq: Every day | SUBCUTANEOUS | Status: DC

## 2019-08-15 MED ORDER — ATORVASTATIN CALCIUM 40 MG PO TABS
40.0000 mg | ORAL_TABLET | Freq: Every day | ORAL | Status: DC
  Administered 2019-08-15 – 2019-08-17 (×3): 40 mg via ORAL
  Filled 2019-08-15 (×3): qty 1

## 2019-08-15 NOTE — H&P (Signed)
History and Physical    Mark Schroeder PJA:[id number] DOB: 03/01/1945 DOA: 08/15/2019  PCP: [facility]  Patient coming from: home   Chief Complaint: SOB, fever, productive cough  HPI: Mark Schroeder is a 74 y.o. male with medical history significant of  COPD, type 2 diabetes with neuropathy, HTN, right BKA, HTN, HLD and PTSD; who presented to ED with 3-4 days of worsening SOB, productive cough, fevers and general malaise. Patient reported symptoms similar to previous PNA admission and expressed that the use of home inhalers are not assisting with his SOB. Patient denies N/V, hemoptysis, CP, dysuria, abd pain, focal weakness and hematuria. patient reported worsening wheezing.    ED Course: in ED work up demonstrated bronchiectasis, acute resp failure with hypoxia and the patient met sesis criteria with elevated HR, RR and high grade temp. CXR/CT chest demonstrating scaring/bronchitic changes and concerns for underlying Right upper lobe PNA.   Review of Systems: As per HPI otherwise all other systems reviewed and are negative.   Past Medical History:  Diagnosis Date  . COPD (chronic obstructive pulmonary disease) ([place])   . Depression   . Diabetic peripheral neuropathy ([place])   . Hyperlipidemia   . Hypertension   . PTSD (post-traumatic stress disorder)   . Type 2 diabetes mellitus ([place])     Past Surgical History:  Procedure Laterality Date  . 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 Titanium Rod in left leg from knee to ankle Left 03/2011    Social History  reports that he has quit smoking. His smoking use included cigarettes. He has a 8.75 pack-year smoking history. He has never used smokeless tobacco. He reports that he does not drink alcohol and does not use drugs.  No Known Allergies  Family History  Problem Relation Age of Onset  . Hypertension Mother      Prior to Admission medications   Medication Sig Start Date End  Date Taking? Authorizing Provider  acetaminophen (TYLENOL) 325 MG tablet Take 2 tablets (650 mg total) by mouth every 6 (six) hours as needed for mild pain (or Fever >/= 101). 04/17/19   Emokpae, Courage, MD  albuterol (PROVENTIL HFA;VENTOLIN HFA) 108 (90 BASE) MCG/ACT inhaler Inhale 2 puffs into the lungs 4 (four) times daily.    Suzie Portela, MD  albuterol (PROVENTIL) (2.5 MG/3ML) 0.083% nebulizer solution Take 2.5 mg by nebulization 3 (three) times daily as needed for wheezing or shortness of breath.    [provider]  aspirin EC 81 MG tablet Take 1 tablet (81 mg total) by mouth daily with breakfast. 04/17/19   Denton Brick, Courage, MD  atorvastatin (LIPITOR) 40 MG tablet Take 40 mg by mouth daily.    [provider]  budesonide-formoterol (SYMBICORT) 160-4.5 MCG/ACT inhaler Inhale 2 puffs into the lungs 2 (two) times daily. 04/17/19   Roxan Hockey, MD  buPROPion (ZYBAN) 150 MG 12 hr tablet Take 150 mg by mouth 2 (two) times daily.    [provider]  carvedilol (COREG) 12.5 MG tablet Take 12.5 mg by mouth every 12 (twelve) hours.    [provider]  cetirizine (ZYRTEC ALLERGY) 10 MG tablet Take 10 mg by mouth daily.    [provider]  Cholecalciferol (VITAMIN D PO) Take 1,000 capsules by mouth daily.     [provider]  doxepin (SINEQUAN) 50 MG capsule Take 100 mg by mouth at bedtime.    [provider]  gabapentin (NEURONTIN)  300 MG capsule Take 600 mg by mouth in the morning and at bedtime.     [provider]  guaiFENesin (ROBITUSSIN) 100 MG/5ML SOLN Take 5 mLs by mouth 4 (four) times daily as needed for cough or to loosen phlegm.    [provider]  lactobacillus acidophilus (BACID) TABS tablet Take 1 tablet by mouth 2 (two) times daily.    [provider]  Melatonin 5 MG TABS Take 10 mg by mouth at bedtime.    [provider]  metFORMIN (GLUCOPHAGE) 500 MG tablet Take 1 tablet (500 mg  total) by mouth See admin instructions. Take 1 tablet twice a day with food while on prednisone for additional 2 days after completing prednisone, may go back to 1 tablet with breakfast daily in about a week from now after completing prednisone 04/17/19   Roxan Hockey, MD  pantoprazole (PROTONIX) 40 MG tablet Take 40 mg by mouth 2 (two) times daily.     [provider]  potassium chloride SA (KLOR-CON) 20 MEQ tablet Take 10 mEq by mouth daily.    [provider]  predniSONE (DELTASONE) 20 MG tablet Take 2 tablets (40 mg total) by mouth daily with breakfast. 04/17/19   Roxan Hockey, MD  sildenafil (VIAGRA) 100 MG tablet Take 100 mg by mouth daily as needed for erectile dysfunction. Do not take within 6 hours of Terazosin, Prazosin, or Doxazosin.    [provider]  terazosin (HYTRIN) 2 MG capsule Take 2 mg by mouth at bedtime.     [provider]  tiotropium (SPIRIVA) 18 MCG inhalation capsule Place 1 capsule (18 mcg total) into inhaler and inhale daily. 04/17/19   Roxan Hockey, MD  triamterene-hydrochlorothiazide (MAXZIDE-25) 37.5-25 MG per tablet Take 1 tablet by mouth daily.    [provider]    Physical Exam: Vitals:   08/15/19 8413 08/15/19 0640 08/15/19 0700 08/15/19 0723  BP:  136/76 135/73   Pulse: 94 100 100   Resp: (!) 23 19 (!) 24   Temp:    99.1 F (37.3 C)  TempSrc:    Oral  SpO2: 96% 98% 95%   Weight:      Height:        Constitutional: NAD, calm, comfortable, with difficulties speaking in full sentences and requiring O2 supplementation. Patient warm to touch. No CP. Vitals:   08/15/19 0611 08/15/19 0640 08/15/19 0700 08/15/19 0723  BP:  136/76 135/73   Pulse: 94 100 100   Resp: (!) 23 19 (!) 24   Temp:    99.1 F (37.3 C)  TempSrc:    Oral  SpO2: 96% 98% 95%   Weight:      Height:       Eyes: PERRL, lids and conjunctivae normal ENMT: Mucous membranes are moist. Posterior pharynx clear of any exudate or  lesions. Neck: normal, supple, no masses, no thyromegaly, no JVD Respiratory: positive rhonchi, tachypenic with minimal exertion, positive exp wheezing, no using accessory muscles. Cardiovascular: sinus tachycardia, no murmurs / rubs / gallops. No extremity edema. 2+ pedal pulses. No carotid bruits.  Abdomen: no tenderness, no masses palpated. No hepatosplenomegaly. Bowel sounds positive.  Musculoskeletal: no clubbing / cyanosis. Right BKA. Skin: no rashes, lesions, ulcers. No induration Neurologic: CN 2-12 grossly intact. Sensation intact, DTR normal. Normal Strength. Psychiatric: Normal judgment and insight. Alert and oriented x 3. Normal mood.    Labs on Admission: I have personally reviewed following labs and imaging studies  CBC: Recent Labs  Lab 08/15/19 0121  WBC 4.5  NEUTROABS 2.3  HGB 13.1  HCT 41.7  MCV 94.6  PLT 956    Basic Metabolic Panel: Recent Labs  Lab 08/15/19 0121 08/15/19 0450  NA 137  --   K 2.9*  --   CL 97*  --   CO2 29  --   GLUCOSE 116*  --   BUN 8  --   CREATININE 1.09  --   CALCIUM 8.4*  --   MG  --  1.6*  PHOS  --  3.0    GFR: Estimated Creatinine Clearance: 56.9 mL/min (by C-G formula based on SCr of 1.09 mg/dL).  Liver Function Tests: Recent Labs  Lab 08/15/19 0121  AST 34  ALT 25  ALKPHOS 55  BILITOT 0.5  PROT 7.6  ALBUMIN 3.7    Urine analysis:    Component Value Date/Time   COLORURINE YELLOW 08/15/2014 [place] 08/15/2014 1134   LABSPEC 1.020 08/15/2014 1134   PHURINE 6.5 08/15/2014 1134   GLUCOSEU 100 (A) 08/15/2014 1134   HGBUR MODERATE (A) 08/15/2014 1134   BILIRUBINUR NEGATIVE 08/15/2014 1134   KETONESUR NEGATIVE 08/15/2014 1134   PROTEINUR 30 (A) 08/15/2014 1134   UROBILINOGEN >8.0 (H) 08/15/2014 1134   NITRITE NEGATIVE 08/15/2014 1134   LEUKOCYTESUR NEGATIVE 08/15/2014 1134    Radiological Exams on Admission: CT Angio Chest PE W and/or Wo Contrast  Result Date: 08/15/2019 CLINICAL  DATA:  Fever for 3 days, elevated D-dimer EXAM: CT ANGIOGRAPHY CHEST WITH CONTRAST TECHNIQUE: Multidetector CT imaging of the chest was performed using the standard protocol during bolus administration of intravenous contrast. Multiplanar CT image reconstructions and MIPs were obtained to evaluate the vascular anatomy. CONTRAST:  141m OMNIPAQUE IOHEXOL 350 MG/ML SOLN COMPARISON:  Radiographs 08/15/2019, 04/13/2019, chest CTA 08/12/2010 (report only, images unavailable) FINDINGS: Cardiovascular: Satisfactory opacification the pulmonary arteries to the segmental level. No pulmonary artery filling defects are identified. Central pulmonary arteries are normal caliber. Atherosclerotic plaque within the normal caliber aorta. Shared origin of brachiocephalic and left common carotid artery, the origin of which is poorly visualized due to streak artifact from adjacent contrast bolus in the brachiocephalic vein. Minimal plaque elsewhere in the proximal great vessels. Normal heart size. No pericardial effusion. Coronary artery calcifications are present. Mediastinum/Nodes: No mediastinal fluid or gas. Normal thyroid gland and thoracic inlet. No acute abnormality of the trachea. Small hiatal hernia. Esophagus is otherwise unremarkable. No worrisome mediastinal, hilar or axillary adenopathy. Lungs/Pleura: There is diffuse centrilobular and paraseptal emphysematous change throughout the lungs. Some right apical pleuroparenchymal scarring is noted. There is 1.7 cm solid nodule in the right upper lobe with surrounding reticular changes and architectural distortion as well as adjacent bronchiectatic features. While this could reflect a post infectious or postinflammatory finding. Underlying malignant nodule is not fully excluded. No other suspicious nodules or masses. Some right apical pleuroparenchymal scarring is likely present on comparison imaging as remote is 2015. Diffusely, there is mild airways thickening and scattered  secretions. No pneumothorax or visible effusion. Upper Abdomen: No acute abnormalities present in the visualized portions of the upper abdomen. Musculoskeletal: Multilevel degenerative changes are present in the imaged portions of the spine and shoulders. No chest wall mass or suspicious bone lesions identified. Review of the MIP images confirms the above findings. IMPRESSION: 1. No evidence of acute pulmonary artery filling defects. 2. Diffuse mild airways thickening could reflect some acute bronchitic change or sequela of more chronic bronchitis/smoking related airways disease on a  background of emphysema. ( Emphysema (ICD10-J43.9).) 3. 1.7 cm solid nodule in the right upper lobe with surrounding reticular changes and architectural distortion as well as adjacent bronchiectatic features. While this could reflect a post infectious or postinflammatory given more diffuse consolidative opacity in this location on prior, underlying malignant pulmonary nodule is not fully excluded. Consider pulmonology consultation and 3 month follow-up CT imaging as PET-CT may be complicated by background of inflammation. This recommendation follows the consensus statement: Guidelines for Management of Incidental Pulmonary Nodules Detected on CT Images: From the Fleischner Society 2017; Radiology 2017; 284:228-243. 4. Small hiatal hernia. 5. Aortic Atherosclerosis (ICD10-I70.0). Electronically Signed   By: Lovena Le M.D.   On: 08/15/2019 03:54   DG Chest Port 1 View  Result Date: 08/15/2019 CLINICAL DATA:  Shortness of breath EXAM: PORTABLE CHEST 1 VIEW COMPARISON:  Chest radiograph 04/13/2019 FINDINGS: There is mild residual scarring in the right upper lobe. No acute airspace disease. No pleural effusion or pneumothorax. IMPRESSION: Mild residual scarring in the right upper lobe. Electronically Signed   By: Ulyses Jarred M.D.   On: 08/15/2019 02:04    EKG: Independently reviewed. Sinus tachycardia, no acute ischemic changes  appreciated.  Assessment/Plan 1-Sepsis due to undetermined organism [facility]): in the setting of CAP/bronchiectasis.  -POA -hypoxia as organ dysfunction -normal lactic acid  -IVF's give, cultures taken and IV antibiotics for CAP started -will follow clinical response -continue PRN antipyretics -checking Resp viral panel -vaccinated against COVID and with neg Covid test.  2-COPD with exacerbation ([place]) -in the setting of bronchiectasis/CAP -will use solumedrol, bronchodilators, pulmicort and flutter valve -follow response -continue abx's as mentioned above.  3-Hypokalemia -checking Mg -telemetry monitoring -replete electrolytes as needed  4-Type 2 diabetes mellitus (H[facility]) -Oral hypoglycemic agents on hold -will use SSI and levemir -follow CBG's  5-Hypertension -continue home antihypertensive agents except for his diuretics overnight -follow VS  6-Depression -No SI or hallucinations. -continue home antidepressant regimen   7-Hyperlipidemia -continue statins   DVT prophylaxis: Lovenox. Code Status:   Full code. Family Communication:  No family at bedside Disposition Plan:   Patient is from:  Home   Anticipated DC to:  Home   Anticipated DC date:  To be determined.   Anticipated DC barriers: Improvement in his O2 saturation and resolution of sepsis criteria.  Consults called:  None  Admission status:  Telemetry, inpatient status, LOS > 2 midnights.  Severity of Illness: Inpatient status, moderate to severe illness given findings on CXR, meeting sepsis criteria with fever, elevated RR, HR and organ dysfunction with new oxygen requirement.    Barton Dubois MD [facility]  How to contact the [facility] Attending or Consulting provider [place] or covering provider during after hours [place], for this patient?   1. Check the care team in [facility] and look for a) attending/consulting [facility] provider listed and b) the [facility] team listed 2. Log into [email or web address] and use [facility]'s universal password to access. If you do not have the password, please contact the hospital operator. 3. Locate the [facility] provider you are looking for under [facility] and page to a number that you can be directly reached. 4. If you still have difficulty reaching the provider, please page the [facility] (Director on Call) for the Hospitalists listed on amion for assistance.  08/15/2019, 7:35 AM

## 2019-08-15 NOTE — ED Notes (Signed)
PT was given meal tray.

## 2019-08-15 NOTE — ED Triage Notes (Addendum)
Pt here c/o fever for the past 3 days and just generally not feeling well. Pt febrile in triage 103.0

## 2019-08-15 NOTE — ED Provider Notes (Signed)
[facility] EMERGENCY DEPARTMENT Provider Note   CSN: [id number] Arrival date & time: 08/15/19  0046     History Chief Complaint  Patient presents with  . Fever    Mark Schroeder is a 74 y.o. male.  Patient with a history of COPD and diabetes here with a 3-day history of progressively worsening fever, feeling unwell, cough and shortness of breath.  Patient states this feels like the last time he had pneumonia.  States he has a cough productive of yellow and green mucus.  Fever at home up to 103 associated with shortness of breath, chills and body aches.  Did receive the Covid vaccine.  No abdominal pain, nausea or vomiting.  No sick contacts or recent travel.  States he feels like he is coming down with pneumonia again. Using albuterol at home without relief. Denies any chest pain, abdominal pain, nausea or vomiting.  No leg pain or leg swelling.  The history is provided by the patient.  Fever Associated symptoms: congestion, cough, headaches, myalgias and rhinorrhea   Associated symptoms: no chest pain, no dysuria, no nausea and no vomiting        Past Medical History:  Diagnosis Date  . COPD (chronic obstructive pulmonary disease) (HCC)   . Depression   . Diabetic peripheral neuropathy (HCC)   . Hyperlipidemia   . Hypertension   . PTSD (post-traumatic stress disorder)   . Type 2 diabetes mellitus [facility])     Patient Active Problem List   Diagnosis Date Noted  . Hyperbilirubinemia 04/13/2019  . Hypertension   . Depression   . PTSD (post-traumatic stress disorder)   . Diabetic peripheral neuropathy (HCC)   . Hyperlipidemia   . CAP (community acquired pneumonia) 08/15/2014  . COPD with exacerbation (HCC) 08/15/2014  . Hypokalemia 08/15/2014  . Acute kidney injury (HCC) 08/15/2014  . Normocytic anemia 08/15/2014  . Type 2 diabetes mellitus (HCC) 08/15/2014    Past Surgical History:  Procedure Laterality Date  . 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 Titanium Rod in left leg from knee to ankle Left 03/2011       Family History  Problem Relation Age of Onset  . Hypertension Mother     Social History   Tobacco Use  . Smoking status: Former Smoker    Packs/day: 0.25    Years: 35.00    Pack years: 8.75    Types: Cigarettes  . Smokeless tobacco: Never Used  Substance Use Topics  . Alcohol use: No  . Drug use: No    Home Medications Prior to Admission medications   Medication Sig Start Date End Date Taking? Authorizing Provider  acetaminophen (TYLENOL) 325 MG tablet Take 2 tablets (650 mg total) by mouth every 6 (six) hours as needed for mild pain (or Fever >/= 101). 04/17/19   Emokpae, Courage, MD  albuterol (PROVENTIL HFA;VENTOLIN HFA) 108 (90 BASE) MCG/ACT inhaler Inhale 2 puffs into the lungs 4 (four) times daily.    Carles Collet, MD  albuterol (PROVENTIL) (2.5 MG/3ML) 0.083% nebulizer solution Take 2.5 mg by nebulization 3 (three) times daily as needed for wheezing or shortness of breath.    [provider]  aspirin EC 81 MG tablet Take 1 tablet (81 mg total) by mouth daily with breakfast. 04/17/19   Mariea Clonts, Courage, MD  atorvastatin (LIPITOR) 40 MG tablet Take 40 mg by mouth daily.    [provider]  budesonide-formoterol (SYMBICORT) 160-4.5 MCG/ACT inhaler Inhale  2 puffs into the lungs 2 (two) times daily. 04/17/19   Shon Hale, MD  buPROPion (ZYBAN) 150 MG 12 hr tablet Take 150 mg by mouth 2 (two) times daily.    [provider]  carvedilol (COREG) 12.5 MG tablet Take 12.5 mg by mouth every 12 (twelve) hours.    [provider]  cetirizine (ZYRTEC ALLERGY) 10 MG tablet Take 10 mg by mouth daily.    [provider]  Cholecalciferol (VITAMIN D PO) Take 1,000 capsules by mouth daily.     [provider]  doxepin (SINEQUAN) 50 MG capsule Take 100 mg by mouth at bedtime.    [provider]  gabapentin (NEURONTIN) 300 MG capsule Take  600 mg by mouth in the morning and at bedtime.     [provider]  guaiFENesin (ROBITUSSIN) 100 MG/5ML SOLN Take 5 mLs by mouth 4 (four) times daily as needed for cough or to loosen phlegm.    [provider]  lactobacillus acidophilus (BACID) TABS tablet Take 1 tablet by mouth 2 (two) times daily.    [provider]  Melatonin 5 MG TABS Take 10 mg by mouth at bedtime.    [provider]  metFORMIN (GLUCOPHAGE) 500 MG tablet Take 1 tablet (500 mg total) by mouth See admin instructions. Take 1 tablet twice a day with food while on prednisone for additional 2 days after completing prednisone, may go back to 1 tablet with breakfast daily in about a week from now after completing prednisone 04/17/19   Shon Hale, MD  pantoprazole (PROTONIX) 40 MG tablet Take 40 mg by mouth 2 (two) times daily.     [provider]  potassium chloride SA (KLOR-CON) 20 MEQ tablet Take 10 mEq by mouth daily.    [provider]  predniSONE (DELTASONE) 20 MG tablet Take 2 tablets (40 mg total) by mouth daily with breakfast. 04/17/19   Shon Hale, MD  sildenafil (VIAGRA) 100 MG tablet Take 100 mg by mouth daily as needed for erectile dysfunction. Do not take within 6 hours of Terazosin, Prazosin, or Doxazosin.    [provider]  terazosin (HYTRIN) 2 MG capsule Take 2 mg by mouth at bedtime.     [provider]  tiotropium (SPIRIVA) 18 MCG inhalation capsule Place 1 capsule (18 mcg total) into inhaler and inhale daily. 04/17/19   Shon Hale, MD  triamterene-hydrochlorothiazide (MAXZIDE-25) 37.5-25 MG per tablet Take 1 tablet by mouth daily.    [provider]    Allergies    Patient has no known allergies.  Review of Systems   Review of Systems  Constitutional: Positive for activity change, appetite change, fatigue and fever.  HENT: Positive for congestion and rhinorrhea.   Eyes: Negative for visual disturbance.  Respiratory:  Positive for cough and shortness of breath.   Cardiovascular: Negative for chest pain and leg swelling.  Gastrointestinal: Negative for abdominal pain, nausea and vomiting.  Genitourinary: Negative for dysuria and hematuria.  Musculoskeletal: Positive for arthralgias and myalgias.  Neurological: Positive for weakness and headaches.   all other systems are negative except as noted in the HPI and PMH.    Physical Exam Updated Vital Signs BP (!) 123/112 (BP Location: Left Arm)   Pulse (!) 111   Temp (!) 103 F (39.4 C) (Oral)   Resp 20   Ht 5\' 3"  (1.6 m)   Wt 78.9 kg   SpO2 91%   BMI 30.82 kg/m   Physical Exam Vitals and  nursing note reviewed.  Constitutional:      General: He is in acute distress.     Appearance: He is well-developed. He is ill-appearing.     Comments: Mild dyspnea with conversation  HENT:     Head: Normocephalic and atraumatic.     Mouth/Throat:     Pharynx: No oropharyngeal exudate.  Eyes:     Conjunctiva/sclera: Conjunctivae normal.     Pupils: Pupils are equal, round, and reactive to light.  Neck:     Comments: No meningismus. Cardiovascular:     Rate and Rhythm: Regular rhythm. Tachycardia present.     Heart sounds: Normal heart sounds. No murmur heard.   Pulmonary:     Effort: Pulmonary effort is normal. No respiratory distress.     Breath sounds: Wheezing present.     Comments: Diminished breath sounds bilaterally Abdominal:     Palpations: Abdomen is soft.     Tenderness: There is no abdominal tenderness. There is no guarding or rebound.  Musculoskeletal:        General: No tenderness. Normal range of motion.     Cervical back: Normal range of motion and neck supple.     Comments: Right BKA  Skin:    General: Skin is warm.  Neurological:     Mental Status: He is alert and oriented to person, place, and time.     Cranial Nerves: No cranial nerve deficit.     Motor: No abnormal muscle tone.     Coordination: Coordination normal.      Comments:  5/5 strength throughout. CN 2-12 intact.Equal grip strength.   Psychiatric:        Behavior: Behavior normal.     ED Results / Procedures / Treatments   Labs (all labs ordered are listed, but only abnormal results are displayed) Labs Reviewed  COMPREHENSIVE METABOLIC PANEL - Abnormal; Notable for the following components:      Result Value   Potassium 2.9 (*)    Chloride 97 (*)    Glucose, Bld 116 (*)    Calcium 8.4 (*)    All other components within normal limits  D-DIMER, QUANTITATIVE (NOT AT [facility]) - Abnormal; Notable for the following components:   D-Dimer, Quant 1.00 (*)    All other components within normal limits  LACTATE DEHYDROGENASE - Abnormal; Notable for the following components:   LDH 209 (*)    All other components within normal limits  MAGNESIUM - Abnormal; Notable for the following components:   Magnesium 1.6 (*)    All other components within normal limits  SARS CORONAVIRUS 2 BY RT PCR (HOSPITAL ORDER, [place] [facility])  CULTURE, BLOOD (ROUTINE X 2)  CULTURE, BLOOD (ROUTINE X 2)  LACTIC ACID, PLASMA  CBC WITH DIFFERENTIAL/PLATELET  PROCALCITONIN  FERRITIN  TRIGLYCERIDES  FIBRINOGEN  C-REACTIVE PROTEIN  PHOSPHORUS  TROPONIN I (HIGH SENSITIVITY)  TROPONIN I (HIGH SENSITIVITY)    EKG EKG Interpretation  Date/Time:  Wednesday August 15 2019 01:27:01 EDT Ventricular Rate:  107 PR Interval:    QRS Duration: 80 QT Interval:  364 QTC Calculation: 486 R Axis:   77 Text Interpretation: Sinus tachycardia Borderline prolonged QT interval No significant change was found Confirmed by Ezequiel Essex [phone]) on 08/15/2019 1:29:14 AM   Radiology CT Angio Chest PE W and/or Wo Contrast  Result Date: 08/15/2019 CLINICAL DATA:  Fever for 3 days, elevated D-dimer EXAM: CT ANGIOGRAPHY CHEST WITH CONTRAST TECHNIQUE: Multidetector CT imaging of the chest was performed using the  standard protocol during bolus administration of intravenous  contrast. Multiplanar CT image reconstructions and MIPs were obtained to evaluate the vascular anatomy. CONTRAST:  OMNIPAQUE IOHEXOL 350 MG/ML SOLN COMPARISON:  Radiographs 08/15/2019, 04/13/2019, chest CTA 08/12/2010 (report only, images unavailable) FINDINGS: Cardiovascular: Satisfactory opacification the pulmonary arteries to the segmental level. No pulmonary artery filling defects are identified. Central pulmonary arteries are normal caliber. Atherosclerotic plaque within the normal caliber aorta. Shared origin of brachiocephalic and left common carotid artery, the origin of which is poorly visualized due to streak artifact from adjacent contrast bolus in the brachiocephalic vein. Minimal plaque elsewhere in the proximal great vessels. Normal heart size. No pericardial effusion. Coronary artery calcifications are present. Mediastinum/Nodes: No mediastinal fluid or gas. Normal thyroid gland and thoracic inlet. No acute abnormality of the trachea. Small hiatal hernia. Esophagus is otherwise unremarkable. No worrisome mediastinal, hilar or axillary adenopathy. Lungs/Pleura: There is diffuse centrilobular and paraseptal emphysematous change throughout the lungs. Some right apical pleuroparenchymal scarring is noted. There is 1.7 cm solid nodule in the right upper lobe with surrounding reticular changes and architectural distortion as well as adjacent bronchiectatic features. While this could reflect a post infectious or postinflammatory finding. Underlying malignant nodule is not fully excluded. No other suspicious nodules or masses. Some right apical pleuroparenchymal scarring is likely present on comparison imaging as remote is 2015. Diffusely, there is mild airways thickening and scattered secretions. No pneumothorax or visible effusion. Upper Abdomen: No acute abnormalities present in the visualized portions of the upper abdomen. Musculoskeletal: Multilevel degenerative changes are present in the imaged  portions of the spine and shoulders. No chest wall mass or suspicious bone lesions identified. Review of the MIP images confirms the above findings. IMPRESSION: 1. No evidence of acute pulmonary artery filling defects. 2. Diffuse mild airways thickening could reflect some acute bronchitic change or sequela of more chronic bronchitis/smoking related airways disease on a background of emphysema. ( Emphysema (ICD10-J43.9).) 3. 1.7 cm solid nodule in the right upper lobe with surrounding reticular changes and architectural distortion as well as adjacent bronchiectatic features. While this could reflect a post infectious or postinflammatory given more diffuse consolidative opacity in this location on prior, underlying malignant pulmonary nodule is not fully excluded. Consider pulmonology consultation and 3 month follow-up CT imaging as PET-CT may be complicated by background of inflammation. This recommendation follows the consensus statement: Guidelines for Management of Incidental Pulmonary Nodules Detected on CT Images: From the Fleischner Society 2017; Radiology 2017; 284:228-243. 4. Small hiatal hernia. 5. Aortic Atherosclerosis (ICD10-I70.0). Electronically Signed   By: Kreg Shropshire M.D.   On: 08/15/2019 03:54   DG Chest Port 1 View  Result Date: 08/15/2019 CLINICAL DATA:  Shortness of breath EXAM: PORTABLE CHEST 1 VIEW COMPARISON:  Chest radiograph 04/13/2019 FINDINGS: There is mild residual scarring in the right upper lobe. No acute airspace disease. No pleural effusion or pneumothorax. IMPRESSION: Mild residual scarring in the right upper lobe. Electronically Signed   By: Deatra Robinson M.D.   On: 08/15/2019 02:04    Procedures .Critical Care Performed by: Glynn Octave, MD Authorized by: Glynn Octave, MD   Critical care provider statement:    Critical care time (minutes):  45   Critical care was necessary to treat or prevent imminent or life-threatening deterioration of the following  conditions:  Sepsis and respiratory failure   Critical care was time spent personally by me on the following activities:  Discussions with consultants, evaluation of patient's response to treatment, examination of patient,  ordering and performing treatments and interventions, ordering and review of laboratory studies, ordering and review of radiographic studies, pulse oximetry, re-evaluation of patient's condition, obtaining history from patient or surrogate and review of old charts   (including critical care time)  Medications Ordered in ED Medications - No data to display  ED Course  I have reviewed the triage vital signs and the nursing notes.  Pertinent labs & imaging results that were available during my care of the patient were reviewed by me and considered in my medical decision making (see chart for details).    MDM Rules/Calculators/A&P                         Several day history of cough, fever, body aches, chills and shortness of breath.  No wheezing on exam.  Concern for Covid versus pneumonia.  Patient with fever, tachycardia, new oxygen requirement.  Chest x-ray consistent with area of right upper lobe scarring. Labs show mild hypokalemia. D-dimer 1.0 Troponin negative. EKG nonischemic.   Covid testing is negative.  Patient started on antibiotics for suspected community-acquired pneumonia.  He is febrile, tachycardic, and hypoxic. Cultures sent.   CT scan shows no pulmonary embolism but does show diffuse airway thickening.  Right upper lobe nodule may be chronic scarring or may be due to something more ominous.  Needs follow-up in 3 months and pulmonology evaluation.  Discussed with patient.  Plan admission for continued IV antibiotics given his new oxygen requirement with fever and tachypnea.  D/w Dr. Robb Matarrtiz.  Mark Schroeder was evaluated in Emergency Department on 08/15/2019 for the symptoms described in the history of present illness. He was evaluated in the context of  the global COVID-19 pandemic, which necessitated consideration that the patient might be at risk for infection with the SARS-CoV-2 virus that causes COVID-19. Institutional protocols and algorithms that pertain to the evaluation of patients at risk for COVID-19 are in a state of rapid change based on information released by regulatory bodies including the CDC and federal and state organizations. These policies and algorithms were followed during the patient's care in the ED.  Final Clinical Impression(s) / ED Diagnoses Final diagnoses:  None    Rx / DC Orders ED Discharge Orders    None       Barkley Kratochvil, Jeannett SeniorStephen, MD 08/15/19 [id number]

## 2019-08-15 NOTE — Clinical Social Work Note (Signed)
[facility] Emergency Notification complete. Notification ID# is: [id number].  Memory Argue, LCSW Transitions of Care Clinical Social Worker [facility] Ph: [phone]

## 2019-08-16 LAB — GLUCOSE, CAPILLARY
Glucose-Capillary: 140 mg/dL — ABNORMAL HIGH (ref 70–99)
Glucose-Capillary: 129 mg/dL — ABNORMAL HIGH (ref 70–99)
Glucose-Capillary: 92 mg/dL (ref 70–99)
Glucose-Capillary: 145 mg/dL — ABNORMAL HIGH (ref 70–99)

## 2019-08-16 LAB — CBC
HCT: 37 % — ABNORMAL LOW (ref 39.0–52.0)
Hemoglobin: 11.6 g/dL — ABNORMAL LOW (ref 13.0–17.0)
MCH: 29.7 pg (ref 26.0–34.0)
MCHC: 31.4 g/dL (ref 30.0–36.0)
MCV: 94.9 fL (ref 80.0–100.0)
Platelets: 267 10*3/uL (ref 150–400)
RBC: 3.9 MIL/uL — ABNORMAL LOW (ref 4.22–5.81)
RDW: 12.6 % (ref 11.5–15.5)
WBC: 5 10*3/uL (ref 4.0–10.5)
nRBC: 0 % (ref 0.0–0.2)

## 2019-08-16 LAB — BASIC METABOLIC PANEL
Anion gap: 10 (ref 5–15)
BUN: 11 mg/dL (ref 8–23)
CO2: 24 mmol/L (ref 22–32)
Calcium: 7.6 mg/dL — ABNORMAL LOW (ref 8.9–10.3)
Chloride: 105 mmol/L (ref 98–111)
Creatinine, Ser: 0.69 mg/dL (ref 0.61–1.24)
GFR calc Af Amer: >60 mL/min (ref >60)
GFR calc non Af Amer: >60 mL/min (ref >60)
Glucose, Bld: 140 mg/dL — ABNORMAL HIGH (ref 70–99)
Potassium: 3.9 mmol/L (ref 3.5–5.1)
Sodium: 139 mmol/L (ref 135–145)

## 2019-08-16 LAB — MAGNESIUM: Magnesium: 2 mg/dL (ref 1.7–2.4)

## 2019-08-16 MED ORDER — DM-GUAIFENESIN ER 30-600 MG PO TB12
1.0000 | ORAL_TABLET | Freq: Two times a day (BID) | ORAL | Status: DC
  Administered 2019-08-16 – 2019-08-17 (×3): 1 via ORAL
  Filled 2019-08-16 (×3): qty 1

## 2019-08-16 MED ORDER — IPRATROPIUM-ALBUTEROL 0.5-2.5 (3) MG/3ML IN SOLN
3.0000 mL | Freq: Three times a day (TID) | RESPIRATORY_TRACT | Status: DC
  Administered 2019-08-16 – 2019-08-17 (×3): 3 mL via RESPIRATORY_TRACT
  Filled 2019-08-16 (×3): qty 3

## 2019-08-16 NOTE — Progress Notes (Signed)
PROGRESS NOTE    TEL HEVIA  UXL:[id number] DOB: 11/29/1945 DOA: 08/15/2019 PCP: [facility]   Chief Complaint  Patient presents with  . Fever    Brief Narrative:  74 y.o. male with medical history significant of  COPD, type 2 diabetes with neuropathy, HTN, right BKA, HTN, HLD and PTSD; who presented to ED with 3-4 days of worsening SOB, productive cough, fevers and general malaise. Patient reported symptoms similar to previous PNA admission and expressed that the use of home inhalers are not assisting with his SOB. Patient denies N/V, hemoptysis, CP, dysuria, abd pain, focal weakness and hematuria. patient reported worsening wheezing.    ED Course: in ED work up demonstrated bronchiectasis, acute resp failure with hypoxia and the patient met sesis criteria with elevated HR, RR and high grade temp. CXR/CT chest demonstrating scaring/bronchitic changes and concerns for underlying Right upper lobe PNA.    Assessment & Plan: 1-sepsis due to bronchiectasis/pneumonia: Present on admission. -Improve and currently with almost completely resolved sepsis features. -Continue current antibiotics -Normal lactic acid -Negative Covid test and respiratory viral panel. -Continue as needed antipyretics and current IV antibiotics. -Assess oxygen saturation with activity. -No fever and normal WBCs appreciated.  2-COPD with acute exacerbation -In the setting of bronchiectasis/community-acquired pneumonia -Continue the use of Solu-Medrol, bronchodilators, Pulmicort and for above -Continue antibiotics as mentioned above -Still with some wheezing on examination and feeling short of breath with activity -No using accessory muscle.  3-hypokalemia/hypomagnesemia -Electrolytes have been repleted and within normal limits currently -Continue to monitor -D/C telemetry.  4-type 2 diabetes mellitus -Continue holding oral hypoglycemic agents -Continue sliding scale insulin and  Levemir. -Follow CBGs.  5-hypertension -Stable and well-controlled -Continue current antihypertensive regimen -Heart healthy diet has been encouraged.  6-depression -No suicidal ideation or hallucination -Continue home antidepressant regimen.  7-hyperlipidemia -Continue statins.  Principal Problem:   Sepsis due to undetermined organism [facility]) Active Problems:   COPD with exacerbation (HCC)   Hypokalemia   Type 2 diabetes mellitus ([place])   Hypertension   Depression   Hyperlipidemia   DVT prophylaxis: lovenox. Code Status: Full code. Family Communication: no family at bedside; patient will update wife. Disposition:   Status is: Inpatient  Dispo: The patient is from: Home              Anticipated d/c is to: Home              Anticipated d/c date is: 08/17/19              Patient currently Not medically ready for discharge; still feeling short of breath and winded with activity.  Positive wheezing appreciated on examination.  No fever, no nausea, no vomiting.  Off O2 supplementation currently.  Continue nebulizers/bronchodilators regimen, continue current antibiotics, increase physical activity.  If remains stable will discharge home with oral regimen on 08/17/2019. Electrolytes repleted and stable, will discontinue telemetry.        Consultants:   None   Procedures:  See below for x-ray reports.   Antimicrobials:  Rocephin and Zithromax.   Subjective: Afebrile, no nausea or vomiting.  Reports improvement in his breathing. Still Winded with activity and having wheezing. No using O2 supplementation currently.  Objective: Vitals:   08/16/19 0811 08/16/19 1038 08/16/19 1350 08/16/19 1414  BP:  (!) 155/102 (!) 157/95   Pulse:  92 82   Resp:  18 19   Temp:  97.7 F (36.5 C) 97.9 F (36.6 C)   TempSrc:  Oral Oral   SpO2: 99% 95% 96% 95%  Weight:      Height:        Intake/Output Summary (Last 24 hours) at 08/16/2019 1608 Last data filed at 08/16/2019  1500 Gross per 24 hour  Intake 2889.95 ml  Output --  Net 2889.95 ml   Filed Weights   08/15/19 0102  Weight: 78.9 kg    Examination: General exam: Appears calm and comfortable, afebrile currently not requiring O2 supplementation.  Reports still feeling short of breath with activity but significantly improved.  No chest pain, no nausea or vomiting. Respiratory system: Positive scattered rhonchi right, mild expiratory wheezing appreciated on exam; no using accessory muscles. Cardiovascular system: S1 & S2 heard, RRR. No JVD, no rubs or gallops.  No pedal edema appreciated. Gastrointestinal system: Abdomen is nondistended, soft and nontender. No organomegaly or masses felt. Normal bowel sounds heard. Central nervous system: Alert and oriented. No focal neurological deficits. Extremities: No cyanosis or clubbing.  Right BKA appreciated on exam. Skin: No rashes, no petechiae. Psychiatry: Judgement and insight appear normal. Mood & affect appropriate.     Data Reviewed: I have personally reviewed following labs and imaging studies  CBC: Recent Labs  Lab 08/15/19 0121 08/16/19 0429  WBC 4.5 5.0  NEUTROABS 2.3  --   HGB 13.1 11.6*  HCT 41.7 37.0*  MCV 94.6 94.9  PLT 326 195    Basic Metabolic Panel: Recent Labs  Lab 08/15/19 0121 08/15/19 0450 08/16/19 0429  NA 137  --  139  K 2.9*  --  3.9  CL 97*  --  105  CO2 29  --  24  GLUCOSE 116*  --  140*  BUN 8  --  11  CREATININE 1.09  --  0.69  CALCIUM 8.4*  --  7.6*  MG  --  1.6* 2.0  PHOS  --  3.0  --     GFR: Estimated Creatinine Clearance: 77.6 mL/min (by C-G formula based on SCr of 0.69 mg/dL).  Liver Function Tests: Recent Labs  Lab 08/15/19 0121  AST 34  ALT 25  ALKPHOS 55  BILITOT 0.5  PROT 7.6  ALBUMIN 3.7    CBG: Recent Labs  Lab 08/15/19 1206 08/15/19 1716 08/15/19 2121 08/16/19 0738 08/16/19 1157  GLUCAP 186* 139* 129* 140* 129*     Recent Results (from the past 240 hour(s))  SARS  Coronavirus 2 by RT PCR (hospital order, performed in [facility] hospital lab) Nasopharyngeal Nasopharyngeal Swab     Status: None   Collection Time: 08/15/19  1:21 AM   Specimen: Nasopharyngeal Swab  Result Value Ref Range Status   SARS Coronavirus 2 NEGATIVE NEGATIVE Final    Comment: (NOTE) SARS-CoV-2 target nucleic acids are NOT DETECTED.  The SARS-CoV-2 RNA is generally detectable in upper and lower respiratory specimens during the acute phase of infection. The lowest concentration of SARS-CoV-2 viral copies this assay can detect is 250 copies / mL. A negative result does not preclude SARS-CoV-2 infection and should not be used as the sole basis for treatment or other patient management decisions.  A negative result may occur with improper specimen collection / handling, submission of specimen other than nasopharyngeal swab, presence of viral mutation(s) within the areas targeted by this assay, and inadequate number of viral copies (<250 copies / mL). A negative result must be combined with clinical observations, patient history, and epidemiological information.  Fact Sheet for Patients:   StrictlyIdeas.no  Fact Sheet for Healthcare  Providers: BankingDealers.co.za  This test is not yet approved or  cleared by the [place] and has been authorized for detection and/or diagnosis of SARS-CoV-2 by FDA under an Emergency Use Authorization (EUA).  This EUA will remain in effect (meaning this test can be used) for the duration of the COVID-19 declaration under Section 564(b)(1) of the Act, 21 U.S.C. section 360bbb-3(b)(1), unless the authorization is terminated or revoked sooner.  Performed at [facility], [address], [place], [place] [address]   Blood Culture (routine x 2)     Status: None (Preliminary result)   Collection Time: 08/15/19  1:21 AM   Specimen: Right Antecubital; Blood  Result Value Ref Range Status    Specimen Description RIGHT ANTECUBITAL  Final   Special Requests   Final    BOTTLES DRAWN AEROBIC AND ANAEROBIC Blood Culture adequate volume   Culture   Final    NO GROWTH 1 DAY Performed at [facility], [address] [place], [place] [address]    Report Status PENDING  Incomplete  Blood Culture (routine x 2)     Status: None (Preliminary result)   Collection Time: 08/15/19  1:40 AM   Specimen: Left Antecubital; Blood  Result Value Ref Range Status   Specimen Description LEFT ANTECUBITAL  Final   Special Requests   Final    BOTTLES DRAWN AEROBIC AND ANAEROBIC Blood Culture adequate volume Performed at [facility], [address], [place], [place] [address]    Culture PENDING  Incomplete   Report Status PENDING  Incomplete     Radiology Studies: CT Angio Chest PE W and/or Wo Contrast  Result Date: 08/15/2019 CLINICAL DATA:  Fever for 3 days, elevated D-dimer EXAM: CT ANGIOGRAPHY CHEST WITH CONTRAST TECHNIQUE: Multidetector CT imaging of the chest was performed using the standard protocol during bolus administration of intravenous contrast. Multiplanar CT image reconstructions and MIPs were obtained to evaluate the vascular anatomy. CONTRAST:  174m OMNIPAQUE IOHEXOL 350 MG/ML SOLN COMPARISON:  Radiographs 08/15/2019, 04/13/2019, chest CTA 08/12/2010 (report only, images unavailable) FINDINGS: Cardiovascular: Satisfactory opacification the pulmonary arteries to the segmental level. No pulmonary artery filling defects are identified. Central pulmonary arteries are normal caliber. Atherosclerotic plaque within the normal caliber aorta. Shared origin of brachiocephalic and left common carotid artery, the origin of which is poorly visualized due to streak artifact from adjacent contrast bolus in the brachiocephalic vein. Minimal plaque elsewhere in the proximal great vessels. Normal heart size. No pericardial effusion. Coronary artery calcifications are present. Mediastinum/Nodes: No mediastinal  fluid or gas. Normal thyroid gland and thoracic inlet. No acute abnormality of the trachea. Small hiatal hernia. Esophagus is otherwise unremarkable. No worrisome mediastinal, hilar or axillary adenopathy. Lungs/Pleura: There is diffuse centrilobular and paraseptal emphysematous change throughout the lungs. Some right apical pleuroparenchymal scarring is noted. There is 1.7 cm solid nodule in the right upper lobe with surrounding reticular changes and architectural distortion as well as adjacent bronchiectatic features. While this could reflect a post infectious or postinflammatory finding. Underlying malignant nodule is not fully excluded. No other suspicious nodules or masses. Some right apical pleuroparenchymal scarring is likely present on comparison imaging as remote is 2015. Diffusely, there is mild airways thickening and scattered secretions. No pneumothorax or visible effusion. Upper Abdomen: No acute abnormalities present in the visualized portions of the upper abdomen. Musculoskeletal: Multilevel degenerative changes are present in the imaged portions of the spine and shoulders. No chest wall mass or suspicious bone lesions identified. Review of the MIP images  confirms the above findings. IMPRESSION: 1. No evidence of acute pulmonary artery filling defects. 2. Diffuse mild airways thickening could reflect some acute bronchitic change or sequela of more chronic bronchitis/smoking related airways disease on a background of emphysema. ( Emphysema (ICD10-J43.9).) 3. 1.7 cm solid nodule in the right upper lobe with surrounding reticular changes and architectural distortion as well as adjacent bronchiectatic features. While this could reflect a post infectious or postinflammatory given more diffuse consolidative opacity in this location on prior, underlying malignant pulmonary nodule is not fully excluded. Consider pulmonology consultation and 3 month follow-up CT imaging as PET-CT may be complicated by  background of inflammation. This recommendation follows the consensus statement: Guidelines for Management of Incidental Pulmonary Nodules Detected on CT Images: From the Fleischner Society 2017; Radiology 2017; 284:228-243. 4. Small hiatal hernia. 5. Aortic Atherosclerosis (ICD10-I70.0). Electronically Signed   By: Lovena Le M.D.   On: 08/15/2019 03:54   DG Chest Port 1 View  Result Date: 08/15/2019 CLINICAL DATA:  Shortness of breath EXAM: PORTABLE CHEST 1 VIEW COMPARISON:  Chest radiograph 04/13/2019 FINDINGS: There is mild residual scarring in the right upper lobe. No acute airspace disease. No pleural effusion or pneumothorax. IMPRESSION: Mild residual scarring in the right upper lobe. Electronically Signed   By: Ulyses Jarred M.D.   On: 08/15/2019 02:04    Scheduled Meds: . aspirin EC  81 mg Oral Q breakfast  . atorvastatin  40 mg Oral Daily  . budesonide (PULMICORT) nebulizer solution  0.5 mg Nebulization BID  . buPROPion  150 mg Oral BID  . carvedilol  12.5 mg Oral Q12H  . dextromethorphan-guaiFENesin  1 tablet Oral BID  . doxepin  100 mg Oral QHS  . enoxaparin (LOVENOX) injection  40 mg Subcutaneous Q24H  . gabapentin  600 mg Oral QHS  . insulin aspart  0-15 Units Subcutaneous TID WC  . insulin aspart  0-5 Units Subcutaneous QHS  . insulin detemir  10 Units Subcutaneous QHS  . ipratropium-albuterol  3 mL Nebulization TID  . loratadine  10 mg Oral Daily  . methylPREDNISolone (SOLU-MEDROL) injection  40 mg Intravenous Q12H  . pantoprazole  40 mg Oral BID  . terazosin  2 mg Oral QHS   Continuous Infusions: . azithromycin 500 mg (08/16/19 0505)  . cefTRIAXone (ROCEPHIN)  IV 1 g (08/16/19 0505)     LOS: 1 day    Time spent: 30 minutes    Barton Dubois, MD [facility]   To contact the attending provider between 7A-7P or the covering provider during after hours 7P-7A, please log into the web site [email or web address] and access using universal [place] password for  that web site. If you do not have the password, please call the hospital operator.  08/16/2019, 4:08 PM

## 2019-08-17 DIAGNOSIS — J9601 Acute respiratory failure with hypoxia: Secondary | ICD-10-CM

## 2019-08-17 LAB — GLUCOSE, CAPILLARY
Glucose-Capillary: 129 mg/dL — ABNORMAL HIGH (ref 70–99)
Glucose-Capillary: 205 mg/dL — ABNORMAL HIGH (ref 70–99)
Glucose-Capillary: 73 mg/dL (ref 70–99)

## 2019-08-17 LAB — LEGIONELLA PNEUMOPHILA SEROGP 1 UR AG: L. pneumophila Serogp 1 Ur Ag: NEGATIVE

## 2019-08-17 MED ORDER — PREDNISONE 20 MG PO TABS: ORAL_TABLET | ORAL | 0 refills | Status: DC

## 2019-08-17 MED ORDER — TERAZOSIN HCL 2 MG PO CAPS: 2.0000 mg | ORAL_CAPSULE | Freq: Every day | ORAL | Status: DC

## 2019-08-17 MED ORDER — DM-GUAIFENESIN ER 30-600 MG PO TB12: 1.0000 | ORAL_TABLET | Freq: Two times a day (BID) | ORAL | 0 refills | Status: AC

## 2019-08-17 MED ORDER — AMOXICILLIN-POT CLAVULANATE 875-125 MG PO TABS
1.0000 | ORAL_TABLET | Freq: Two times a day (BID) | ORAL | 0 refills | Status: AC
Start: 2019-08-17 — End: 2019-08-22

## 2019-08-17 NOTE — Discharge Summary (Signed)
Physician Discharge Summary  Mark Schroeder TOI:[id number] DOB: 1946-01-16 DOA: 08/15/2019  PCP: [facility]  Admit date: 08/15/2019 Discharge date: 08/17/2019  Time spent: 35 minutes  Recommendations for Outpatient Follow-up:  1. Reassess blood pressure and adjust antihypertensive regimen as needed 2. Repeat basic metabolic panel and magnesium level to evaluate electrolytes and renal function. 3. Patient will benefit of follow-up with pulmonologist to further adjust maintenance management for his COPD and to repeat PFTs.   Discharge Diagnoses:  Principal Problem:   Sepsis due to undetermined organism [facility]) Active Problems:   COPD with exacerbation ([place])   Hypokalemia   Type 2 diabetes mellitus ([place])   Hypertension   Depression   Hyperlipidemia   Acute respiratory failure with hypoxia ([place])   Discharge Condition: Stable and improved.  Discharged home with instruction to follow-up with PCP in 10 days  CODE STATUS: Full code.  Diet recommendation: Heart healthy and modify carbohydrate diet.  Filed Weights   08/15/19 0102  Weight: 78.9 kg    History of present illness:  74 y.o.malewith medical history significant ofCOPD, type 2 diabetes with neuropathy, HTN, right BKA, HTN, HLD and PTSD; who presented to ED with 3-4 days of worsening SOB, productive cough, fevers and general malaise. Patient reported symptoms similar to previous PNA admission and expressed that the use of home inhalers are not assisting with his SOB. Patient denies N/V, hemoptysis, CP, dysuria, abd pain, focal weakness and hematuria. patient reported worsening wheezing.   ED Course:in ED work up demonstrated bronchiectasis, acute resp failure with hypoxia and the patient met sesis criteria with elevated HR, RR and high grade temp. CXR/CT chest demonstrating scaring/bronchitic changes and concerns for underlying Right upper lobe PNA.   Hospital Course:  1-sepsis due to  bronchiectasis/pneumonia: Present on admission. -Sepsis features resolved at discharge. -Normal lactic acid -Negative Covid test and negative respiratory viral panel. -Continue as needed antipyretics and complete antibiotic therapy using Augmentin orally. -No requiring oxygen supplementation.   -Reports breathing stable at discharge. -No fever and normal WBCs appreciated.  2-COPD with acute exacerbation -In the setting of bronchiectasis/community-acquired pneumonia -Continue steroid tapering, as needed bronchodilators and resume Symbicort  -Continue antibiotics as mentioned above -At discharge improved air movement bilaterally, no requiring oxygen supplementation and without active expiratory wheezing.  3-hypokalemia/hypomagnesemia -Electrolytes have been repleted and within normal limits at discharge. -Will recommend repeat basic metabolic panel and magnesium level at follow-up visit to reassess electrolytes trend. -Patient advised to maintain adequate hydration and oral intake -Safe to resume diuretics.  4-type 2 diabetes mellitus -Stable CBGs -Resume home oral hypoglycemic regimen -Modified carbohydrate diet has been discussed.  5-hypertension -Stable and well-controlled -Continue current antihypertensive regimen -Heart healthy diet has been encouraged.  6-depression -No suicidal ideation or hallucination -Continue home antidepressant regimen.  7-hyperlipidemia -Continue statins. -Heart healthy diet has been encouraged.  Procedures: See below for x-ray reports.  Consultations:  None  Discharge Exam: Vitals:   08/17/19 0831 08/17/19 0836  BP:    Pulse:    Resp:    Temp:    SpO2: 98% 100%    General: Afebrile, no chest pain, no difficulty speaking in full sentences and no requiring oxygen supplementation.  Feels stable to go home. Cardiovascular: S1 and S2, no rubs, no gallops, no murmurs, no JVD. Respiratory: Improved air movement bilaterally,  positive scattered rhonchi right, no wheezing or crackles. Abdomen: Soft, nontender, nondistended, positive bowel sounds Extremities: No cyanosis or clubbing.  Discharge Instructions   Discharge Instructions  Diet - low sodium heart healthy   Complete by: As directed    Discharge instructions   Complete by: As directed    Take medications as prescribed Arrange follow-up with PCP in 10 days Maintain adequate hydration Take antibiotics around food.     Allergies as of 08/17/2019   No Known Allergies     Medication List    STOP taking these medications   guaiFENesin 100 MG/5ML Soln Commonly known as: ROBITUSSIN   loratadine 10 MG tablet Commonly known as: CLARITIN   potassium chloride SA 20 MEQ tablet Commonly known as: KLOR-CON     TAKE these medications   acetaminophen 325 MG tablet Commonly known as: TYLENOL Take 2 tablets (650 mg total) by mouth every 6 (six) hours as needed for mild pain (or Fever >/= 101).   albuterol (2.5 MG/3ML) 0.083% nebulizer solution Commonly known as: PROVENTIL Take 2.5 mg by nebulization 3 (three) times daily as needed for wheezing or shortness of breath.   albuterol 108 (90 Base) MCG/ACT inhaler Commonly known as: VENTOLIN HFA Inhale 2 puffs into the lungs 4 (four) times daily.   amoxicillin-clavulanate 875-125 MG tablet Commonly known as: Augmentin Take 1 tablet by mouth 2 (two) times daily for 5 days.   aspirin EC 81 MG tablet Take 1 tablet (81 mg total) by mouth daily with breakfast.   atorvastatin 40 MG tablet Commonly known as: LIPITOR Take 40 mg by mouth daily.   baclofen 10 MG tablet Commonly known as: LIORESAL Take 10 mg by mouth 2 (two) times daily.   budesonide-formoterol 160-4.5 MCG/ACT inhaler Commonly known as: SYMBICORT Inhale 2 puffs into the lungs 2 (two) times daily.   buPROPion 150 MG 12 hr tablet Commonly known as: ZYBAN Take 150 mg by mouth 2 (two) times daily.   carboxymethylcellulose 0.5 %  Soln Commonly known as: REFRESH PLUS 1 drop 4 (four) times daily as needed (for dry eyes).   carvedilol 12.5 MG tablet Commonly known as: COREG Take 12.5 mg by mouth every 12 (twelve) hours.   dextromethorphan-guaiFENesin 30-600 MG 12hr tablet Commonly known as: MUCINEX DM Take 1 tablet by mouth 2 (two) times daily for 20 doses.   diclofenac Sodium 1 % Gel Commonly known as: VOLTAREN Apply 4 g topically 3 (three) times daily as needed (for pain).   divalproex 250 MG 24 hr tablet Commonly known as: DEPAKOTE ER Take 250 mg by mouth daily.   doxepin 50 MG capsule Commonly known as: SINEQUAN Take 100 mg by mouth at bedtime.   famotidine 40 MG tablet Commonly known as: PEPCID Take 40 mg by mouth 2 (two) times daily.   gabapentin 300 MG capsule Commonly known as: NEURONTIN Take 600 mg by mouth in the morning and at bedtime.   lactobacillus acidophilus Tabs tablet Take 1 tablet by mouth 2 (two) times daily.   melatonin 5 MG Tabs Take 10 mg by mouth at bedtime.   metFORMIN 500 MG tablet Commonly known as: GLUCOPHAGE Take 1 tablet (500 mg total) by mouth See admin instructions. Take 1 tablet twice a day with food while on prednisone for additional 2 days after completing prednisone, may go back to 1 tablet with breakfast daily in about a week from now after completing prednisone   pantoprazole 40 MG tablet Commonly known as: PROTONIX Take 40 mg by mouth 2 (two) times daily.   predniSONE 20 MG tablet Commonly known as: Deltasone Take 3 tablets by mouth x1 day; then 2 tablets by mouth for  2 days; then 1 tablet by mouth for 2 days; then half tablet by mouth x3 days and stop prednisone. What changed:   how much to take  how to take this  when to take this  additional instructions   sildenafil 100 MG tablet Commonly known as: VIAGRA Take 100 mg by mouth daily as needed for erectile dysfunction. Do not take within 6 hours of Terazosin, Prazosin, or Doxazosin.    tamsulosin 0.4 MG Caps capsule Commonly known as: FLOMAX Take 0.4 mg by mouth daily.   terazosin 2 MG capsule Commonly known as: HYTRIN Take 1 capsule (2 mg total) by mouth at bedtime.   tiotropium 18 MCG inhalation capsule Commonly known as: SPIRIVA Place 1 capsule (18 mcg total) into inhaler and inhale daily.   triamcinolone 0.025 % cream Commonly known as: KENALOG Apply 1 application topically 2 (two) times daily.   triamterene-hydrochlorothiazide 37.5-25 MG tablet Commonly known as: MAXZIDE-25 Take 1 tablet by mouth daily.   VITAMIN D PO Take 1,000 capsules by mouth daily.   ZyrTEC Allergy 10 MG tablet Generic drug: cetirizine Take 10 mg by mouth daily.      No Known Allergies  Follow-up [place]. Schedule an appointment as soon as possible for a visit in 10 day(s).   Specialty: General Practice Contact information: [address] [place] [address] [phone]               The results of significant diagnostics from this hospitalization (including imaging, microbiology, ancillary and laboratory) are listed below for reference.    Significant Diagnostic Studies: CT Angio Chest PE W and/or Wo Contrast  Result Date: 08/15/2019 CLINICAL DATA:  Fever for 3 days, elevated D-dimer EXAM: CT ANGIOGRAPHY CHEST WITH CONTRAST TECHNIQUE: Multidetector CT imaging of the chest was performed using the standard protocol during bolus administration of intravenous contrast. Multiplanar CT image reconstructions and MIPs were obtained to evaluate the vascular anatomy. CONTRAST:  150m OMNIPAQUE IOHEXOL 350 MG/ML SOLN COMPARISON:  Radiographs 08/15/2019, 04/13/2019, chest CTA 08/12/2010 (report only, images unavailable) FINDINGS: Cardiovascular: Satisfactory opacification the pulmonary arteries to the segmental level. No pulmonary artery filling defects are identified. Central pulmonary arteries are normal caliber. Atherosclerotic plaque within the  normal caliber aorta. Shared origin of brachiocephalic and left common carotid artery, the origin of which is poorly visualized due to streak artifact from adjacent contrast bolus in the brachiocephalic vein. Minimal plaque elsewhere in the proximal great vessels. Normal heart size. No pericardial effusion. Coronary artery calcifications are present. Mediastinum/Nodes: No mediastinal fluid or gas. Normal thyroid gland and thoracic inlet. No acute abnormality of the trachea. Small hiatal hernia. Esophagus is otherwise unremarkable. No worrisome mediastinal, hilar or axillary adenopathy. Lungs/Pleura: There is diffuse centrilobular and paraseptal emphysematous change throughout the lungs. Some right apical pleuroparenchymal scarring is noted. There is 1.7 cm solid nodule in the right upper lobe with surrounding reticular changes and architectural distortion as well as adjacent bronchiectatic features. While this could reflect a post infectious or postinflammatory finding. Underlying malignant nodule is not fully excluded. No other suspicious nodules or masses. Some right apical pleuroparenchymal scarring is likely present on comparison imaging as remote is 2015. Diffusely, there is mild airways thickening and scattered secretions. No pneumothorax or visible effusion. Upper Abdomen: No acute abnormalities present in the visualized portions of the upper abdomen. Musculoskeletal: Multilevel degenerative changes are present in the imaged portions of the spine and shoulders. No chest wall mass or suspicious bone lesions  identified. Review of the MIP images confirms the above findings. IMPRESSION: 1. No evidence of acute pulmonary artery filling defects. 2. Diffuse mild airways thickening could reflect some acute bronchitic change or sequela of more chronic bronchitis/smoking related airways disease on a background of emphysema. ( Emphysema (ICD10-J43.9).) 3. 1.7 cm solid nodule in the right upper lobe with surrounding  reticular changes and architectural distortion as well as adjacent bronchiectatic features. While this could reflect a post infectious or postinflammatory given more diffuse consolidative opacity in this location on prior, underlying malignant pulmonary nodule is not fully excluded. Consider pulmonology consultation and 3 month follow-up CT imaging as PET-CT may be complicated by background of inflammation. This recommendation follows the consensus statement: Guidelines for Management of Incidental Pulmonary Nodules Detected on CT Images: From the Fleischner Society 2017; Radiology 2017; 284:228-243. 4. Small hiatal hernia. 5. Aortic Atherosclerosis (ICD10-I70.0). Electronically Signed   By: Lovena Le M.D.   On: 08/15/2019 03:54   DG Chest Port 1 View  Result Date: 08/15/2019 CLINICAL DATA:  Shortness of breath EXAM: PORTABLE CHEST 1 VIEW COMPARISON:  Chest radiograph 04/13/2019 FINDINGS: There is mild residual scarring in the right upper lobe. No acute airspace disease. No pleural effusion or pneumothorax. IMPRESSION: Mild residual scarring in the right upper lobe. Electronically Signed   By: Ulyses Jarred M.D.   On: 08/15/2019 02:04    Microbiology: Recent Results (from the past 240 hour(s))  SARS Coronavirus 2 by RT PCR (hospital order, performed in [facility] hospital lab) Nasopharyngeal Nasopharyngeal Swab     Status: None   Collection Time: 08/15/19  1:21 AM   Specimen: Nasopharyngeal Swab  Result Value Ref Range Status   SARS Coronavirus 2 NEGATIVE NEGATIVE Final    Comment: (NOTE) SARS-CoV-2 target nucleic acids are NOT DETECTED.  The SARS-CoV-2 RNA is generally detectable in upper and lower respiratory specimens during the acute phase of infection. The lowest concentration of SARS-CoV-2 viral copies this assay can detect is 250 copies / mL. A negative result does not preclude SARS-CoV-2 infection and should not be used as the sole basis for treatment or other patient management  decisions.  A negative result may occur with improper specimen collection / handling, submission of specimen other than nasopharyngeal swab, presence of viral mutation(s) within the areas targeted by this assay, and inadequate number of viral copies (<250 copies / mL). A negative result must be combined with clinical observations, patient history, and epidemiological information.  Fact Sheet for Patients:   StrictlyIdeas.no  Fact Sheet for Healthcare Providers: BankingDealers.co.za  This test is not yet approved or  cleared by the [place] FDA and has been authorized for detection and/or diagnosis of SARS-CoV-2 by FDA under an Emergency Use Authorization (EUA).  This EUA will remain in effect (meaning this test can be used) for the duration of the COVID-19 declaration under Section 564(b)(1) of the Act, 21 U.S.C. section 360bbb-3(b)(1), unless the authorization is terminated or revoked sooner.  Performed at [facility], [address], [place], [place] [address]   Blood Culture (routine x 2)     Status: None (Preliminary result)   Collection Time: 08/15/19  1:21 AM   Specimen: Right Antecubital; Blood  Result Value Ref Range Status   Specimen Description RIGHT ANTECUBITAL  Final   Special Requests   Final    BOTTLES DRAWN AEROBIC AND ANAEROBIC Blood Culture adequate volume   Culture   Final    NO GROWTH 2 DAYS Performed at [facility],  [address], [place], [place] [address]    Report Status PENDING  Incomplete  Blood Culture (routine x 2)     Status: None (Preliminary result)   Collection Time: 08/15/19  1:40 AM   Specimen: Left Antecubital; Blood  Result Value Ref Range Status   Specimen Description LEFT ANTECUBITAL  Final   Special Requests   Final    BOTTLES DRAWN AEROBIC AND ANAEROBIC Blood Culture adequate volume Performed at [facility], [address], [place], [place] [address]    Culture PENDING  Incomplete    Report Status PENDING  Incomplete     Labs: Basic Metabolic Panel: Recent Labs  Lab 08/15/19 0121 08/15/19 0450 08/16/19 0429  NA 137  --  139  K 2.9*  --  3.9  CL 97*  --  105  CO2 29  --  24  GLUCOSE 116*  --  140*  BUN 8  --  11  CREATININE 1.09  --  0.69  CALCIUM 8.4*  --  7.6*  MG  --  1.6* 2.0  PHOS  --  3.0  --    Liver Function Tests: Recent Labs  Lab 08/15/19 0121  AST 34  ALT 25  ALKPHOS 55  BILITOT 0.5  PROT 7.6  ALBUMIN 3.7   CBC: Recent Labs  Lab 08/15/19 0121 08/16/19 0429  WBC 4.5 5.0  NEUTROABS 2.3  --   HGB 13.1 11.6*  HCT 41.7 37.0*  MCV 94.6 94.9  PLT 326 267   CBG: Recent Labs  Lab 08/16/19 1157 08/16/19 1615 08/16/19 2117 08/17/19 0812 08/17/19 1201  GLUCAP 129* 92 145* 129* 205*    Signed:  Barton Dubois MD.  [facility] 08/17/2019, 12:10 PM

## 2019-08-17 NOTE — Progress Notes (Signed)
Nsg Discharge Note  Admit Date:  08/15/2019 Discharge date: 08/17/2019   Mark Schroeder to be D/C'd Home per MD order.  AVS completed.  Patient able to verbalize understanding.  Discharge Medication: Allergies as of 08/17/2019   No Known Allergies     Medication List    STOP taking these medications   guaiFENesin 100 MG/5ML Soln Commonly known as: ROBITUSSIN   loratadine 10 MG tablet Commonly known as: CLARITIN   potassium chloride SA 20 MEQ tablet Commonly known as: KLOR-CON     TAKE these medications   acetaminophen 325 MG tablet Commonly known as: TYLENOL Take 2 tablets (650 mg total) by mouth every 6 (six) hours as needed for mild pain (or Fever >/= 101).   albuterol (2.5 MG/3ML) 0.083% nebulizer solution Commonly known as: PROVENTIL Take 2.5 mg by nebulization 3 (three) times daily as needed for wheezing or shortness of breath.   albuterol 108 (90 Base) MCG/ACT inhaler Commonly known as: VENTOLIN HFA Inhale 2 puffs into the lungs 4 (four) times daily.   amoxicillin-clavulanate 875-125 MG tablet Commonly known as: Augmentin Take 1 tablet by mouth 2 (two) times daily for 5 days.   aspirin EC 81 MG tablet Take 1 tablet (81 mg total) by mouth daily with breakfast.   atorvastatin 40 MG tablet Commonly known as: LIPITOR Take 40 mg by mouth daily.   baclofen 10 MG tablet Commonly known as: LIORESAL Take 10 mg by mouth 2 (two) times daily.   budesonide-formoterol 160-4.5 MCG/ACT inhaler Commonly known as: SYMBICORT Inhale 2 puffs into the lungs 2 (two) times daily.   buPROPion 150 MG 12 hr tablet Commonly known as: ZYBAN Take 150 mg by mouth 2 (two) times daily.   carboxymethylcellulose 0.5 % Soln Commonly known as: REFRESH PLUS 1 drop 4 (four) times daily as needed (for dry eyes).   carvedilol 12.5 MG tablet Commonly known as: COREG Take 12.5 mg by mouth every 12 (twelve) hours.   dextromethorphan-guaiFENesin 30-600 MG 12hr tablet Commonly known as:  MUCINEX DM Take 1 tablet by mouth 2 (two) times daily for 20 doses.   diclofenac Sodium 1 % Gel Commonly known as: VOLTAREN Apply 4 g topically 3 (three) times daily as needed (for pain).   divalproex 250 MG 24 hr tablet Commonly known as: DEPAKOTE ER Take 250 mg by mouth daily.   doxepin 50 MG capsule Commonly known as: SINEQUAN Take 100 mg by mouth at bedtime.   famotidine 40 MG tablet Commonly known as: PEPCID Take 40 mg by mouth 2 (two) times daily.   gabapentin 300 MG capsule Commonly known as: NEURONTIN Take 600 mg by mouth in the morning and at bedtime.   lactobacillus acidophilus Tabs tablet Take 1 tablet by mouth 2 (two) times daily.   melatonin 5 MG Tabs Take 10 mg by mouth at bedtime.   metFORMIN 500 MG tablet Commonly known as: GLUCOPHAGE Take 1 tablet (500 mg total) by mouth See admin instructions. Take 1 tablet twice a day with food while on prednisone for additional 2 days after completing prednisone, may go back to 1 tablet with breakfast daily in about a week from now after completing prednisone   pantoprazole 40 MG tablet Commonly known as: PROTONIX Take 40 mg by mouth 2 (two) times daily.   predniSONE 20 MG tablet Commonly known as: Deltasone Take 3 tablets by mouth x1 day; then 2 tablets by mouth for 2 days; then 1 tablet by mouth for 2 days; then half tablet  by mouth x3 days and stop prednisone. What changed:   how much to take  how to take this  when to take this  additional instructions   sildenafil 100 MG tablet Commonly known as: VIAGRA Take 100 mg by mouth daily as needed for erectile dysfunction. Do not take within 6 hours of Terazosin, Prazosin, or Doxazosin.   tamsulosin 0.4 MG Caps capsule Commonly known as: FLOMAX Take 0.4 mg by mouth daily.   terazosin 2 MG capsule Commonly known as: HYTRIN Take 1 capsule (2 mg total) by mouth at bedtime.   tiotropium 18 MCG inhalation capsule Commonly known as: SPIRIVA Place 1 capsule  (18 mcg total) into inhaler and inhale daily.   triamcinolone 0.025 % cream Commonly known as: KENALOG Apply 1 application topically 2 (two) times daily.   triamterene-hydrochlorothiazide 37.5-25 MG tablet Commonly known as: MAXZIDE-25 Take 1 tablet by mouth daily.   VITAMIN D PO Take 1,000 capsules by mouth daily.   ZyrTEC Allergy 10 MG tablet Generic drug: cetirizine Take 10 mg by mouth daily.       Discharge Assessment: Vitals:   08/17/19 0836 08/17/19 1451  BP:    Pulse:    Resp:    Temp:    SpO2: 100% 98%   Skin clean, dry and intact without evidence of skin break down, no evidence of skin tears noted. IV catheter discontinued intact. Site without signs and symptoms of complications - no redness or edema noted at insertion site, patient denies c/o pain - only slight tenderness at site.  Dressing with slight pressure applied.  D/c Instructions-Education: Discharge instructions given to patient with verbalized understanding. D/c education completed with patient including follow up instructions, medication list, d/c activities limitations if indicated, with other d/c instructions as indicated by MD - patient able to verbalize understanding, all questions fully answered. Patient instructed to return to ED, call 911, or call MD for any changes in condition.  Patient escorted via WC, and D/C home via private auto.  Jethro Poling, RN 08/17/2019 5:17 PM

## 2019-08-21 LAB — CULTURE, BLOOD (ROUTINE X 2)
Culture: NO GROWTH
Culture: NO GROWTH
Special Requests: ADEQUATE
Special Requests: ADEQUATE

## 2019-09-01 ENCOUNTER — Observation Stay (HOSPITAL_COMMUNITY)
Admission: EM | Admit: 2019-09-01 | Discharge: 2019-09-02 | Disposition: A | Discharge status: Home / Self Care | Payer: No Typology Code available for payment source | Attending: Internal Medicine | Admitting: Internal Medicine

## 2019-09-01 ENCOUNTER — Emergency Department (HOSPITAL_COMMUNITY): Payer: No Typology Code available for payment source

## 2019-09-01 ENCOUNTER — Encounter (HOSPITAL_COMMUNITY): Payer: Self-pay

## 2019-09-01 ENCOUNTER — Other Ambulatory Visit: Payer: Self-pay

## 2019-09-01 DIAGNOSIS — Z7982 Long term (current) use of aspirin: Secondary | ICD-10-CM | POA: Diagnosis not present

## 2019-09-01 DIAGNOSIS — E1140 Type 2 diabetes mellitus with diabetic neuropathy, unspecified: Secondary | ICD-10-CM | POA: Diagnosis not present

## 2019-09-01 DIAGNOSIS — J449 Chronic obstructive pulmonary disease, unspecified: Secondary | ICD-10-CM | POA: Insufficient documentation

## 2019-09-01 DIAGNOSIS — R410 Disorientation, unspecified: Principal | ICD-10-CM | POA: Insufficient documentation

## 2019-09-01 DIAGNOSIS — I10 Essential (primary) hypertension: Secondary | ICD-10-CM | POA: Insufficient documentation

## 2019-09-01 DIAGNOSIS — Z87891 Personal history of nicotine dependence: Secondary | ICD-10-CM | POA: Diagnosis not present

## 2019-09-01 DIAGNOSIS — Z7951 Long term (current) use of inhaled steroids: Secondary | ICD-10-CM | POA: Insufficient documentation

## 2019-09-01 DIAGNOSIS — F4310 Post-traumatic stress disorder, unspecified: Secondary | ICD-10-CM | POA: Diagnosis present

## 2019-09-01 DIAGNOSIS — R4182 Altered mental status, unspecified: Secondary | ICD-10-CM | POA: Diagnosis present

## 2019-09-01 DIAGNOSIS — D649 Anemia, unspecified: Secondary | ICD-10-CM | POA: Diagnosis present

## 2019-09-01 DIAGNOSIS — Z20822 Contact with and (suspected) exposure to covid-19: Secondary | ICD-10-CM | POA: Insufficient documentation

## 2019-09-01 DIAGNOSIS — Z7984 Long term (current) use of oral hypoglycemic drugs: Secondary | ICD-10-CM | POA: Diagnosis not present

## 2019-09-01 DIAGNOSIS — E876 Hypokalemia: Secondary | ICD-10-CM | POA: Insufficient documentation

## 2019-09-01 DIAGNOSIS — E1142 Type 2 diabetes mellitus with diabetic polyneuropathy: Secondary | ICD-10-CM | POA: Diagnosis not present

## 2019-09-01 DIAGNOSIS — E785 Hyperlipidemia, unspecified: Secondary | ICD-10-CM | POA: Diagnosis present

## 2019-09-01 DIAGNOSIS — E119 Type 2 diabetes mellitus without complications: Secondary | ICD-10-CM

## 2019-09-01 LAB — CBC WITH DIFFERENTIAL/PLATELET
Abs Immature Granulocytes: 0.04 10*3/uL (ref 0.00–0.07)
Basophils Absolute: 0 10*3/uL (ref 0.0–0.1)
Basophils Relative: 0 %
Eosinophils Absolute: 0 10*3/uL (ref 0.0–0.5)
Eosinophils Relative: 0 %
HCT: 38 % — ABNORMAL LOW (ref 39.0–52.0)
Hemoglobin: 12.3 g/dL — ABNORMAL LOW (ref 13.0–17.0)
Immature Granulocytes: 0 %
Lymphocytes Relative: 22 %
Lymphs Abs: 2.8 10*3/uL (ref 0.7–4.0)
MCH: 30.1 pg (ref 26.0–34.0)
MCHC: 32.4 g/dL (ref 30.0–36.0)
MCV: 93.1 fL (ref 80.0–100.0)
Monocytes Absolute: 0.9 10*3/uL (ref 0.1–1.0)
Monocytes Relative: 7 %
Neutro Abs: 9 10*3/uL — ABNORMAL HIGH (ref 1.7–7.7)
Neutrophils Relative %: 71 %
Platelets: 410 10*3/uL — ABNORMAL HIGH (ref 150–400)
RBC: 4.08 MIL/uL — ABNORMAL LOW (ref 4.22–5.81)
RDW: 13.2 % (ref 11.5–15.5)
WBC: 12.7 10*3/uL — ABNORMAL HIGH (ref 4.0–10.5)
nRBC: 0 % (ref 0.0–0.2)

## 2019-09-01 LAB — COMPREHENSIVE METABOLIC PANEL
ALT: 17 U/L (ref 0–44)
AST: 15 U/L (ref 15–41)
Albumin: 3.5 g/dL (ref 3.5–5.0)
Alkaline Phosphatase: 41 U/L (ref 38–126)
Anion gap: 13 (ref 5–15)
BUN: 13 mg/dL (ref 8–23)
CO2: 32 mmol/L (ref 22–32)
Calcium: 9.1 mg/dL (ref 8.9–10.3)
Chloride: 94 mmol/L — ABNORMAL LOW (ref 98–111)
Creatinine, Ser: 0.88 mg/dL (ref 0.61–1.24)
GFR calc Af Amer: >60 mL/min (ref >60)
GFR calc non Af Amer: >60 mL/min (ref >60)
Glucose, Bld: 98 mg/dL (ref 70–99)
Potassium: 2.5 mmol/L — CL (ref 3.5–5.1)
Sodium: 139 mmol/L (ref 135–145)
Total Bilirubin: 0.6 mg/dL (ref 0.3–1.2)
Total Protein: 6.9 g/dL (ref 6.5–8.1)

## 2019-09-01 LAB — URINALYSIS, COMPLETE (UACMP) WITH MICROSCOPIC
Bacteria, UA: NONE SEEN
Bilirubin Urine: NEGATIVE
Glucose, UA: NEGATIVE mg/dL
Hgb urine dipstick: NEGATIVE
Ketones, ur: NEGATIVE mg/dL
Leukocytes,Ua: NEGATIVE
Nitrite: NEGATIVE
Protein, ur: NEGATIVE mg/dL
Specific Gravity, Urine: 1.015 (ref 1.005–1.030)
pH: 6 (ref 5.0–8.0)

## 2019-09-01 LAB — RAPID URINE DRUG SCREEN, HOSP PERFORMED
Amphetamines: NOT DETECTED
Barbiturates: NOT DETECTED
Benzodiazepines: NOT DETECTED
Cocaine: NOT DETECTED
Opiates: NOT DETECTED
Tetrahydrocannabinol: NOT DETECTED

## 2019-09-01 LAB — CBG MONITORING, ED: Glucose-Capillary: 89 mg/dL (ref 70–99)

## 2019-09-01 LAB — TSH: TSH: 0.319 u[IU]/mL — ABNORMAL LOW (ref 0.350–4.500)

## 2019-09-01 LAB — LACTIC ACID, PLASMA
Lactic Acid, Venous: 4 mmol/L (ref 0.5–1.9)
Lactic Acid, Venous: 3.9 mmol/L (ref 0.5–1.9)
Lactic Acid, Venous: 3.7 mmol/L (ref 0.5–1.9)

## 2019-09-01 LAB — FOLATE: Folate: 7.8 ng/mL (ref >5.9)

## 2019-09-01 LAB — PROCALCITONIN: Procalcitonin: <0.1 ng/mL

## 2019-09-01 LAB — SARS CORONAVIRUS 2 BY RT PCR (HOSPITAL ORDER, PERFORMED IN ~~LOC~~ HOSPITAL LAB): SARS Coronavirus 2: NEGATIVE

## 2019-09-01 LAB — AMMONIA: Ammonia: 19 umol/L (ref 9–35)

## 2019-09-01 LAB — MAGNESIUM: Magnesium: 1.8 mg/dL (ref 1.7–2.4)

## 2019-09-01 LAB — VITAMIN B12: Vitamin B-12: 444 pg/mL (ref 180–914)

## 2019-09-01 LAB — GLUCOSE, CAPILLARY: Glucose-Capillary: 89 mg/dL (ref 70–99)

## 2019-09-01 MED ORDER — POTASSIUM CHLORIDE 10 MEQ/100ML IV SOLN
10.0000 meq | Freq: Once | INTRAVENOUS | Status: AC
  Administered 2019-09-01: 10 meq via INTRAVENOUS
  Filled 2019-09-01: qty 100

## 2019-09-01 MED ORDER — SODIUM CHLORIDE 0.9 % IV BOLUS
1000.0000 mL | Freq: Once | INTRAVENOUS | Status: AC
  Administered 2019-09-01: 1000 mL via INTRAVENOUS

## 2019-09-01 MED ORDER — POTASSIUM CHLORIDE IN NACL 20-0.9 MEQ/L-% IV SOLN: INTRAVENOUS | Status: DC

## 2019-09-01 MED ORDER — TIOTROPIUM BROMIDE MONOHYDRATE 18 MCG IN CAPS: 18.0000 ug | ORAL_CAPSULE | Freq: Every day | RESPIRATORY_TRACT | Status: DC

## 2019-09-01 MED ORDER — TERAZOSIN HCL 1 MG PO CAPS
2.0000 mg | ORAL_CAPSULE | Freq: Every day | ORAL | Status: DC
  Administered 2019-09-01: 2 mg via ORAL
  Filled 2019-09-01 (×2): qty 1

## 2019-09-01 MED ORDER — POTASSIUM CHLORIDE CRYS ER 20 MEQ PO TBCR
40.0000 meq | EXTENDED_RELEASE_TABLET | ORAL | Status: AC
  Administered 2019-09-01 (×2): 40 meq via ORAL
  Filled 2019-09-01 (×2): qty 2

## 2019-09-01 MED ORDER — POTASSIUM CHLORIDE 10 MEQ/100ML IV SOLN
10.0000 meq | INTRAVENOUS | Status: AC
  Administered 2019-09-01 (×3): 10 meq via INTRAVENOUS
  Filled 2019-09-01 (×3): qty 100

## 2019-09-01 MED ORDER — SODIUM CHLORIDE 0.9 % IV SOLN: INTRAVENOUS | Status: AC

## 2019-09-01 MED ORDER — UMECLIDINIUM BROMIDE 62.5 MCG/INH IN AEPB
1.0000 | INHALATION_SPRAY | Freq: Every day | RESPIRATORY_TRACT | Status: DC
  Administered 2019-09-02: 1 via RESPIRATORY_TRACT
  Filled 2019-09-01: qty 7

## 2019-09-01 MED ORDER — BUPROPION HCL ER (SR) 150 MG PO TB12
150.0000 mg | ORAL_TABLET | Freq: Two times a day (BID) | ORAL | Status: DC
  Administered 2019-09-01 – 2019-09-02 (×2): 150 mg via ORAL
  Filled 2019-09-01 (×6): qty 1

## 2019-09-01 MED ORDER — PANTOPRAZOLE SODIUM 40 MG PO TBEC
40.0000 mg | DELAYED_RELEASE_TABLET | Freq: Two times a day (BID) | ORAL | Status: DC
  Administered 2019-09-01 – 2019-09-02 (×2): 40 mg via ORAL
  Filled 2019-09-01 (×2): qty 1

## 2019-09-01 MED ORDER — ATORVASTATIN CALCIUM 40 MG PO TABS: 40.0000 mg | ORAL_TABLET | Freq: Every day | ORAL | Status: DC

## 2019-09-01 MED ORDER — ASPIRIN EC 81 MG PO TBEC
81.0000 mg | DELAYED_RELEASE_TABLET | Freq: Every day | ORAL | Status: DC
  Administered 2019-09-02: 81 mg via ORAL
  Filled 2019-09-01: qty 1

## 2019-09-01 MED ORDER — HEPARIN SODIUM (PORCINE) 5000 UNIT/ML IJ SOLN
5000.0000 [IU] | Freq: Three times a day (TID) | INTRAMUSCULAR | Status: DC
  Administered 2019-09-01 – 2019-09-02 (×2): 5000 [IU] via SUBCUTANEOUS
  Filled 2019-09-01 (×2): qty 1

## 2019-09-01 MED ORDER — UMECLIDINIUM BROMIDE 62.5 MCG/INH IN AEPB: 1.0000 | INHALATION_SPRAY | Freq: Every day | RESPIRATORY_TRACT | Status: DC

## 2019-09-01 MED ORDER — ALBUTEROL SULFATE (2.5 MG/3ML) 0.083% IN NEBU
2.5000 mg | INHALATION_SOLUTION | Freq: Three times a day (TID) | RESPIRATORY_TRACT | Status: DC | PRN
  Administered 2019-09-02: 2.5 mg via RESPIRATORY_TRACT
  Filled 2019-09-01: qty 3

## 2019-09-01 MED ORDER — DIVALPROEX SODIUM ER 250 MG PO TB24
250.0000 mg | ORAL_TABLET | Freq: Every day | ORAL | Status: DC
  Administered 2019-09-02: 250 mg via ORAL
  Filled 2019-09-01 (×2): qty 1

## 2019-09-01 MED ORDER — CARVEDILOL 12.5 MG PO TABS
12.5000 mg | ORAL_TABLET | Freq: Two times a day (BID) | ORAL | Status: DC
  Administered 2019-09-01 – 2019-09-02 (×2): 12.5 mg via ORAL
  Filled 2019-09-01 (×2): qty 1

## 2019-09-01 MED ORDER — AMLODIPINE BESYLATE 5 MG PO TABS
5.0000 mg | ORAL_TABLET | Freq: Every day | ORAL | Status: DC
  Administered 2019-09-01 – 2019-09-02 (×2): 5 mg via ORAL
  Filled 2019-09-01 (×2): qty 1

## 2019-09-01 MED ORDER — INSULIN ASPART 100 UNIT/ML ~~LOC~~ SOLN: 0.0000 [IU] | Freq: Every day | SUBCUTANEOUS | Status: DC

## 2019-09-01 MED ORDER — TAMSULOSIN HCL 0.4 MG PO CAPS
0.4000 mg | ORAL_CAPSULE | Freq: Every day | ORAL | Status: DC
  Administered 2019-09-02: 0.4 mg via ORAL
  Filled 2019-09-01: qty 1

## 2019-09-01 MED ORDER — INSULIN ASPART 100 UNIT/ML ~~LOC~~ SOLN: 0.0000 [IU] | Freq: Three times a day (TID) | SUBCUTANEOUS | Status: DC

## 2019-09-01 MED ORDER — BACID PO TABS
1.0000 | ORAL_TABLET | Freq: Two times a day (BID) | ORAL | Status: DC
  Filled 2019-09-01 (×4): qty 1

## 2019-09-01 NOTE — ED Provider Notes (Signed)
[facility] EMERGENCY DEPARTMENT Provider Note   CSN: [id number] Arrival date & time: 09/01/19  1351     History Chief Complaint  Patient presents with  . Altered Mental Status    Mark Schroeder is a 74 y.o. male with a history of COPD, diabetes with peripheral neuropathy, hypertension and hyperlipidemia, also with PTSD who was admitted to the hospital recently for sepsis, possibly related to a pneumonia, discharged on June 25 feeling well.  Wife at the bedside has noticed that he was confused when he woke this morning.  She also states he fell out of bed 3 times this morning.  She states he occasionally does this but is unusual to happen 3 times today.  He does endorse that he hit his head during one of the falls.  Wife states he is sleepier than normal and has had visual hallucinations, stating when he was bathing he insisted that a chronic crack in their tub was actually flies on the side of the tub.  He denies any complaints currently.  No complaint of headache, chest pain, shortness of breath, he has not been coughing, vomiting and denies abdominal pain or painful urination.  HPI     Past Medical History:  Diagnosis Date  . COPD (chronic obstructive pulmonary disease) (HCC)   . Depression   . Diabetic peripheral neuropathy (HCC)   . Hyperlipidemia   . Hypertension   . PTSD (post-traumatic stress disorder)   . Type 2 diabetes mellitus [facility])     Patient Active Problem List   Diagnosis Date Noted  . Acute respiratory failure with hypoxia (HCC)   . Sepsis due to undetermined organism (HCC) 08/15/2019  . Hyperbilirubinemia 04/13/2019  . Hypertension   . Depression   . PTSD (post-traumatic stress disorder)   . Diabetic peripheral neuropathy (HCC)   . Hyperlipidemia   . CAP (community acquired pneumonia) 08/15/2014  . COPD with exacerbation (HCC) 08/15/2014  . Hypokalemia 08/15/2014  . Acute kidney injury (HCC) 08/15/2014  . Normocytic anemia 08/15/2014  . Type 2  diabetes mellitus (HCC) 08/15/2014    Past Surgical History:  Procedure Laterality Date  . 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 Titanium Rod in left leg from knee to ankle Left 03/2011       Family History  Problem Relation Age of Onset  . Hypertension Mother     Social History   Tobacco Use  . Smoking status: Former Smoker    Packs/day: 0.25    Years: 35.00    Pack years: 8.75    Types: Cigarettes  . Smokeless tobacco: Never Used  Substance Use Topics  . Alcohol use: No  . Drug use: No    Home Medications Prior to Admission medications   Medication Sig Start Date End Date Taking? Authorizing Provider  acetaminophen (TYLENOL) 325 MG tablet Take 2 tablets (650 mg total) by mouth every 6 (six) hours as needed for mild pain (or Fever >/= 101). 04/17/19   Emokpae, Courage, MD  albuterol (PROVENTIL HFA;VENTOLIN HFA) 108 (90 BASE) MCG/ACT inhaler Inhale 2 puffs into the lungs 4 (four) times daily.    Carles Collet, MD  albuterol (PROVENTIL) (2.5 MG/3ML) 0.083% nebulizer solution Take 2.5 mg by nebulization 3 (three) times daily as needed for wheezing or shortness of breath.    [provider]  aspirin EC 81 MG tablet Take 1 tablet (81 mg total) by mouth daily with breakfast. 04/17/19   Emokpae, Courage,  MD  atorvastatin (LIPITOR) 40 MG tablet Take 40 mg by mouth daily.    [provider]  baclofen (LIORESAL) 10 MG tablet Take 10 mg by mouth 2 (two) times daily.    [provider]  budesonide-formoterol (SYMBICORT) 160-4.5 MCG/ACT inhaler Inhale 2 puffs into the lungs 2 (two) times daily. 04/17/19   Shon HaleEmokpae, Courage, MD  buPROPion (ZYBAN) 150 MG 12 hr tablet Take 150 mg by mouth 2 (two) times daily.    [provider]  carboxymethylcellulose (REFRESH PLUS) 0.5 % SOLN 1 drop 4 (four) times daily as needed (for dry eyes).    [provider]  carvedilol (COREG) 12.5 MG tablet Take 12.5 mg by mouth every  12 (twelve) hours.    [provider]  cetirizine (ZYRTEC ALLERGY) 10 MG tablet Take 10 mg by mouth daily.    [provider]  Cholecalciferol (VITAMIN D PO) Take 1,000 capsules by mouth daily.     [provider]  diclofenac Sodium (VOLTAREN) 1 % GEL Apply 4 g topically 3 (three) times daily as needed (for pain).    [provider]  divalproex (DEPAKOTE ER) 250 MG 24 hr tablet Take 250 mg by mouth daily.    [provider]  doxepin (SINEQUAN) 50 MG capsule Take 100 mg by mouth at bedtime.    [provider]  famotidine (PEPCID) 40 MG tablet Take 40 mg by mouth 2 (two) times daily.    [provider]  gabapentin (NEURONTIN) 300 MG capsule Take 600 mg by mouth in the morning and at bedtime.     [provider]  lactobacillus acidophilus (BACID) TABS tablet Take 1 tablet by mouth 2 (two) times daily.    [provider]  Melatonin 5 MG TABS Take 10 mg by mouth at bedtime.    [provider]  metFORMIN (GLUCOPHAGE) 500 MG tablet Take 1 tablet (500 mg total) by mouth See admin instructions. Take 1 tablet twice a day with food while on prednisone for additional 2 days after completing prednisone, may go back to 1 tablet with breakfast daily in about a week from now after completing prednisone 04/17/19   Shon HaleEmokpae, Courage, MD  pantoprazole (PROTONIX) 40 MG tablet Take 40 mg by mouth 2 (two) times daily.     [provider]  predniSONE (DELTASONE) 20 MG tablet Take 3 tablets by mouth x1 day; then 2 tablets by mouth for 2 days; then 1 tablet by mouth for 2 days; then half tablet by mouth x3 days and stop prednisone. 08/17/19   Vassie LollMadera, Carlos, MD  sildenafil (VIAGRA) 100 MG tablet Take 100 mg by mouth daily as needed for erectile dysfunction. Do not take within 6 hours of Terazosin, Prazosin, or Doxazosin.    [provider]  tamsulosin (FLOMAX) 0.4 MG CAPS capsule Take 0.4 mg by mouth daily.    [provider]  terazosin (HYTRIN) 2 MG capsule Take 1 capsule (2 mg total) by mouth at bedtime. 08/17/19   Vassie LollMadera, Carlos, MD  tiotropium (SPIRIVA) 18 MCG inhalation capsule Place 1 capsule (18 mcg total) into inhaler and inhale daily. 04/17/19   Shon HaleEmokpae, Courage, MD  triamcinolone (KENALOG) 0.025 % cream Apply 1 application topically 2 (two) times daily.    [provider]  triamterene-hydrochlorothiazide (MAXZIDE-25) 37.5-25 MG per tablet Take 1 tablet by mouth daily.    [provider]    Allergies    Patient has no known allergies.  Review of Systems  Review of Systems  Unable to perform ROS: Mental status change    Physical Exam Updated Vital Signs BP (!) 159/79   Pulse 84   Temp 97.9 F (36.6 C) (Temporal)   Resp 16   Ht 5\' 3"  (1.6 m)   Wt 77.1 kg   SpO2 98%   BMI 30.11 kg/m   Physical Exam Vitals and nursing note reviewed.  Constitutional:      Appearance: He is well-developed.  HENT:     Head: Normocephalic and atraumatic.  Eyes:     Conjunctiva/sclera: Conjunctivae normal.  Cardiovascular:     Rate and Rhythm: Normal rate and regular rhythm.     Heart sounds: Normal heart sounds.  Pulmonary:     Effort: Pulmonary effort is normal.     Breath sounds: Normal breath sounds. No wheezing.  Abdominal:     General: Bowel sounds are normal.     Palpations: Abdomen is soft.     Tenderness: There is no abdominal tenderness.  Musculoskeletal:        General: Normal range of motion.     Cervical back: Normal range of motion.  Skin:    General: Skin is warm and dry.  Neurological:     Mental Status: He is alert.     Cranial Nerves: Cranial nerves are intact.     Sensory: Sensation is intact.     Motor: Motor function is intact. No pronator drift.     Comments: Alert and oriented x2.  He knew the month and year, but did not know the day of the week.  Patient is cooperative for exam.     ED Results / Procedures / Treatments   Labs (all labs  ordered are listed, but only abnormal results are displayed) Labs Reviewed  CBC WITH DIFFERENTIAL/PLATELET - Abnormal; Notable for the following components:      Result Value   WBC 12.7 (*)    RBC 4.08 (*)    Hemoglobin 12.3 (*)    HCT 38.0 (*)    Platelets 410 (*)    Neutro Abs 9.0 (*)    All other components within normal limits  LACTIC ACID, PLASMA - Abnormal; Notable for the following components:   Lactic Acid, Venous 4.0 (*)    All other components within normal limits  COMPREHENSIVE METABOLIC PANEL - Abnormal; Notable for the following components:   Potassium 2.5 (*)    Chloride 94 (*)    All other components within normal limits  CULTURE, BLOOD (ROUTINE X 2)  CULTURE, BLOOD (ROUTINE X 2)  SARS CORONAVIRUS 2 BY RT PCR (HOSPITAL ORDER, PERFORMED IN [facility])  URINALYSIS, COMPLETE (UACMP) WITH MICROSCOPIC  LACTIC ACID, PLASMA  CBG MONITORING, ED    EKG None  Radiology CT Head Wo Contrast  Result Date: 09/01/2019 CLINICAL DATA:  Confusion. Multiple recent falls. Visual hallucinations. EXAM: CT HEAD WITHOUT CONTRAST TECHNIQUE: Contiguous axial images were obtained from the base of the skull through the vertex without intravenous contrast. COMPARISON:  None. FINDINGS: Brain: No hemorrhage. No extraaxial collection.No midline shift. There is atrophy.The basal cisterns are unremarkable. Patchy and confluent areas of decreased attenuation are noted throughout the deep and periventricular white matter of the cerebral hemispheres bilaterally, compatible with chronic microvascular ischemic disease. The brainstem is unremarkable. The cerebellum is unremarkable. The sella is unremarkable. Vascular: No hyperdense vessel or unexpected calcification. Skull: The calvarium is unremarkable. The skull base is unremarkable. The visualized upper cervical spine is unremarkable. Sinuses/Orbits: The visualized  orbits are unremarkable. The paranasal sinuses are unremarkable. The mastoid  air cells are clear. Other: The visualized parotid gland is unremarkable. There is no scalp soft tissue swelling. IMPRESSION: 1. No acute intracranial abnormality. 2. Atrophy and chronic microvascular ischemic disease. Electronically Signed   By: Katherine Mantle M.D.   On: 09/01/2019 16:23   DG Chest Port 1 View  Result Date: 09/01/2019 CLINICAL DATA:  Altered mental status. EXAM: PORTABLE CHEST 1 VIEW COMPARISON:  08/15/2019 and prior radiographs FINDINGS: The cardiomediastinal silhouette is unremarkable. There is no evidence of focal airspace disease, pulmonary edema, suspicious pulmonary nodule/mass, pleural effusion, or pneumothorax. No acute bony abnormalities are identified. IMPRESSION: No active disease. Electronically Signed   By: Harmon Pier M.D.   On: 09/01/2019 17:37    Procedures Procedures (including critical care time)  Medications Ordered in ED Medications  potassium chloride 10 mEq in 100 mL IVPB (10 mEq Intravenous New Bag/Given 09/01/19 1737)  sodium chloride 0.9 % bolus 1,000 mL (1,000 mLs Intravenous New Bag/Given 09/01/19 1702)    ED Course  I have reviewed the triage vital signs and the nursing notes.  Pertinent labs & imaging results that were available during my care of the patient were reviewed by me and considered in my medical decision making (see chart for details).    MDM Rules/Calculators/A&P                          Patient presenting with confusion and hypokalemia, recent admission for sepsis of unknown source.  His labs today are revealing for an elevated lactate level of 4.0, his potassium is 2.5, he does have an elevated WBC count of 12.7 and his hemoglobin is borderline low at 12.3.  He does not have a UTI, chest x-ray is clear.  His vital signs are's stable with out hypotension or tachycard, oxygen saturation is 98% and he is afebrile.  Wife at bedside presenting majority of his history including multiple falls today and visual hallucinations confusing  cracks in the bathtub with flies.  A repeat lactate level has been ordered, he has been given IV fluids, but with stable vital signs and no obvious source, I suspect that this may actually not be sepsis.  He will require admission for potassium repletion and further evaluation of his altered mental status.  Call was placed to the hospitalist for admission. Final Clinical Impression(s) / ED Diagnoses Final diagnoses:  Confusion  Hypokalemia    Rx / DC Orders ED Discharge Orders    None       Victoriano Lain 09/01/19 1801    Sabas Sous, MD 09/01/19 [phone]

## 2019-09-01 NOTE — ED Notes (Signed)
CRITICAL VALUE ALERT  Critical Value:  k 2.5  Date & Time Notied:  1753  Provider Notified: idol   Orders Received/Actions taken:

## 2019-09-01 NOTE — H&P (Signed)
TRH H&P   Patient Demographics:    Mark Schroeder, is a 74 y.o. male  MRN: [id number]   DOB - [birth year]  Admit Date - 09/01/2019  Outpatient Primary MD for the patient is [facility]  Referring MD/NP/PA: PA Idol  Patient coming from: home  Chief Complaint  Patient presents with  . Altered Mental Status      HPI:    Mark Schroeder  is a 74 y.o. male, COPD, type 2 diabetes with neuropathy, HTN, right BKA, HTN, HLD and PTSD with recent hospitalization discharged on 6/23, for sepsis with an identified source, COPD exacerbation, brought to ED by his wife for altered mental status, currently she is unavailable at bedside, she was left a voicemail, history was obtained mainly from ED staff and patient himself, wife noted him to have some confusion when he woke up this morning, she reports he also fell out of bed 3 times this morning, reports he has occasional falls, but does not usually happen 3 times a day, he does endorse hitting his head, so CT head was obtained with no acute findings, as well wife reported he is sleepier than usual, and she did notice him reporting some visual hallucination, stating when he was batting he insisted that the chronic crack in the tub was actually flies on the side of the top, patient himself denies any complaints currently, denies fever, chills, polyuria, dysuria, denies any cough or shortness of breath, no wheezing. - in ED work-up was significant for hypokalemia with potassium of 2.5, patient is on Maxide, had leukocytosis of 12 K, but he recently finished his prednisone taper, lactic acid was elevated at 4, he had negative urinalysis, chest x-ray with no acute finding, CT head with no acute findings, triage hospitalist consulted to admit for further evaluation.    Review of systems:    In addition to the HPI above,  No Fever-chills, No  Headache, No changes with Vision or hearing, No problems swallowing food or Liquids, No Chest pain, Cough or Shortness of Breath, No Abdominal pain, No Nausea or Vommitting, Bowel movements are regular, No Blood in stool or Urine, No dysuria, No new skin rashes or bruises, No new joints pains-aches,  No new weakness, tingling, numbness in any extremity, he reports multiple falls No recent weight gain or loss, No polyuria, polydypsia or polyphagia, No significant Mental Stressors.  He was brought for confusion  A full 10 point Review of Systems was done, except as stated above, all other Review of Systems were negative.   With Past History of the following :    Past Medical History:  Diagnosis Date  . COPD (chronic obstructive pulmonary disease) (HCC)   . Depression   . Diabetic peripheral neuropathy (HCC)   . Hyperlipidemia   . Hypertension   . PTSD (post-traumatic stress disorder)   . Type 2 diabetes mellitus (HCC)  Past Surgical History:  Procedure Laterality Date  . 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 Titanium Rod in left leg from knee to ankle Left 03/2011      Social History:     Social History   Tobacco Use  . Smoking status: Former Smoker    Packs/day: 0.25    Years: 35.00    Pack years: 8.75    Types: Cigarettes  . Smokeless tobacco: Never Used  Substance Use Topics  . Alcohol use: No       Family History :     Family History  Problem Relation Age of Onset  . Hypertension Mother       Home Medications:   Prior to Admission medications   Medication Sig Start Date End Date Taking? Authorizing Provider  acetaminophen (TYLENOL) 325 MG tablet Take 2 tablets (650 mg total) by mouth every 6 (six) hours as needed for mild pain (or Fever >/= 101). 04/17/19   Emokpae, Courage, MD  albuterol (PROVENTIL HFA;VENTOLIN HFA) 108 (90 BASE) MCG/ACT inhaler Inhale 2 puffs into the lungs 4 (four) times daily.     Carles Collet, MD  albuterol (PROVENTIL) (2.5 MG/3ML) 0.083% nebulizer solution Take 2.5 mg by nebulization 3 (three) times daily as needed for wheezing or shortness of breath.    [provider]  aspirin EC 81 MG tablet Take 1 tablet (81 mg total) by mouth daily with breakfast. 04/17/19   Mariea Clonts, Courage, MD  atorvastatin (LIPITOR) 40 MG tablet Take 40 mg by mouth daily.    [provider]  baclofen (LIORESAL) 10 MG tablet Take 10 mg by mouth 2 (two) times daily.    [provider]  budesonide-formoterol (SYMBICORT) 160-4.5 MCG/ACT inhaler Inhale 2 puffs into the lungs 2 (two) times daily. 04/17/19   Shon Hale, MD  buPROPion (ZYBAN) 150 MG 12 hr tablet Take 150 mg by mouth 2 (two) times daily.    [provider]  carboxymethylcellulose (REFRESH PLUS) 0.5 % SOLN 1 drop 4 (four) times daily as needed (for dry eyes).    [provider]  carvedilol (COREG) 12.5 MG tablet Take 12.5 mg by mouth every 12 (twelve) hours.    [provider]  cetirizine (ZYRTEC ALLERGY) 10 MG tablet Take 10 mg by mouth daily.    [provider]  Cholecalciferol (VITAMIN D PO) Take 1,000 capsules by mouth daily.     [provider]  diclofenac Sodium (VOLTAREN) 1 % GEL Apply 4 g topically 3 (three) times daily as needed (for pain).    [provider]  divalproex (DEPAKOTE ER) 250 MG 24 hr tablet Take 250 mg by mouth daily.    [provider]  doxepin (SINEQUAN) 50 MG capsule Take 100 mg by mouth at bedtime.    [provider]  famotidine (PEPCID) 40 MG tablet Take 40 mg by mouth 2 (two) times daily.    [provider]  gabapentin (NEURONTIN) 300 MG capsule Take 600 mg by mouth in the morning and at bedtime.     [provider]  lactobacillus acidophilus (BACID) TABS tablet Take 1 tablet by mouth 2 (two) times daily.    [provider]  Melatonin 5 MG TABS Take 10 mg by mouth at bedtime.     [provider]  metFORMIN (GLUCOPHAGE) 500 MG tablet Take 1 tablet (500 mg total) by mouth See admin instructions. Take 1 tablet twice a day with food while on  prednisone for additional 2 days after completing prednisone, may go back to 1 tablet with breakfast daily in about a week from now after completing prednisone 04/17/19   Shon Hale, MD  pantoprazole (PROTONIX) 40 MG tablet Take 40 mg by mouth 2 (two) times daily.     [provider]  predniSONE (DELTASONE) 20 MG tablet Take 3 tablets by mouth x1 day; then 2 tablets by mouth for 2 days; then 1 tablet by mouth for 2 days; then half tablet by mouth x3 days and stop prednisone. 08/17/19   Vassie Loll, MD  sildenafil (VIAGRA) 100 MG tablet Take 100 mg by mouth daily as needed for erectile dysfunction. Do not take within 6 hours of Terazosin, Prazosin, or Doxazosin.    [provider]  tamsulosin (FLOMAX) 0.4 MG CAPS capsule Take 0.4 mg by mouth daily.    [provider]  terazosin (HYTRIN) 2 MG capsule Take 1 capsule (2 mg total) by mouth at bedtime. 08/17/19   Vassie Loll, MD  tiotropium (SPIRIVA) 18 MCG inhalation capsule Place 1 capsule (18 mcg total) into inhaler and inhale daily. 04/17/19   Shon Hale, MD  triamcinolone (KENALOG) 0.025 % cream Apply 1 application topically 2 (two) times daily.    [provider]  triamterene-hydrochlorothiazide (MAXZIDE-25) 37.5-25 MG per tablet Take 1 tablet by mouth daily.    [provider]     Allergies:    No Known Allergies   Physical Exam:   Vitals  Blood pressure (!) 159/79, pulse 84, temperature 97.9 F (36.6 C), temperature source Temporal, resp. rate 16, height 5\' 3"  (1.6 m), weight 77.1 kg, SpO2 98 %.   1. General developed male, laying in bed, in no apparent distress  2. Normal affect and insight, Not Suicidal or Homicidal, Awake Alert, Oriented X 3.  3. No F.N deficits, ALL C.Nerves Intact, Strength 5/5 all 4  extremities, Sensation intact all 4 extremities, Plantars down going.  4. Ears and Eyes appear Normal, Conjunctivae clear, PERRLA. Moist Oral Mucosa.  5. Supple Neck, No JVD, No cervical lymphadenopathy appriciated, No Carotid Bruits.  6. Symmetrical Chest wall movement, Good air movement bilaterally, CTAB.  7. RRR, No Gallops, Rubs or Murmurs, No Parasternal Heave.  8. Positive Bowel Sounds, Abdomen Soft, No tenderness, No organomegaly appriciated,No rebound -guarding or rigidity.  9.  No Cyanosis, Normal Skin Turgor, No Skin Rash or Bruise.  10. Good muscle tone,  joints appear normal , no effusions, Normal ROM.  He has right BKA.  11. No Palpable Lymph Nodes in Neck or Axillae     Data Review:    CBC Recent Labs  Lab 09/01/19 1558  WBC 12.7*  HGB 12.3*  HCT 38.0*  PLT 410*  MCV 93.1  MCH 30.1  MCHC 32.4  RDW 13.2  LYMPHSABS 2.8  MONOABS 0.9  EOSABS 0.0  BASOSABS 0.0   ------------------------------------------------------------------------------------------------------------------  Chemistries  Recent Labs  Lab 09/01/19 1616  NA 139  K 2.5*  CL 94*  CO2 32  GLUCOSE 98  BUN 13  CREATININE 0.88  CALCIUM 9.1  AST 15  ALT 17  ALKPHOS 41  BILITOT 0.6   ------------------------------------------------------------------------------------------------------------------ estimated creatinine clearance is 69.8 mL/min (by C-G formula based on SCr of 0.88 mg/dL). ------------------------------------------------------------------------------------------------------------------ No results for input(s): TSH, T4TOTAL, T3FREE, THYROIDAB in the last 72 hours.  Invalid input(s): FREET3  Coagulation profile No results for input(s): INR, PROTIME in the last 168 hours. ------------------------------------------------------------------------------------------------------------------- No results for input(s): DDIMER in the last 72  hours. -------------------------------------------------------------------------------------------------------------------  Cardiac Enzymes No results for input(s): CKMB, TROPONINI, MYOGLOBIN in the last 168 hours.  Invalid input(s): CK ------------------------------------------------------------------------------------------------------------------ No results found for: BNP   ---------------------------------------------------------------------------------------------------------------  Urinalysis    Component Value Date/Time   COLORURINE YELLOW 09/01/2019 1531   APPEARANCEUR CLEAR 09/01/2019 1531   LABSPEC 1.015 09/01/2019 1531   PHURINE 6.0 09/01/2019 1531   GLUCOSEU NEGATIVE 09/01/2019 1531   HGBUR NEGATIVE 09/01/2019 1531   BILIRUBINUR NEGATIVE 09/01/2019 1531   KETONESUR NEGATIVE 09/01/2019 1531   PROTEINUR NEGATIVE 09/01/2019 1531   UROBILINOGEN >8.0 (H) 08/15/2014 1134   NITRITE NEGATIVE 09/01/2019 1531   LEUKOCYTESUR NEGATIVE 09/01/2019 1531    ----------------------------------------------------------------------------------------------------------------   Imaging Results:    CT Head Wo Contrast  Result Date: 09/01/2019 CLINICAL DATA:  Confusion. Multiple recent falls. Visual hallucinations. EXAM: CT HEAD WITHOUT CONTRAST TECHNIQUE: Contiguous axial images were obtained from the base of the skull through the vertex without intravenous contrast. COMPARISON:  None. FINDINGS: Brain: No hemorrhage. No extraaxial collection.No midline shift. There is atrophy.The basal cisterns are unremarkable. Patchy and confluent areas of decreased attenuation are noted throughout the deep and periventricular white matter of the cerebral hemispheres bilaterally, compatible with chronic microvascular ischemic disease. The brainstem is unremarkable. The cerebellum is unremarkable. The sella is unremarkable. Vascular: No hyperdense vessel or unexpected calcification. Skull: The calvarium is  unremarkable. The skull base is unremarkable. The visualized upper cervical spine is unremarkable. Sinuses/Orbits: The visualized orbits are unremarkable. The paranasal sinuses are unremarkable. The mastoid air cells are clear. Other: The visualized parotid gland is unremarkable. There is no scalp soft tissue swelling. IMPRESSION: 1. No acute intracranial abnormality. 2. Atrophy and chronic microvascular ischemic disease. Electronically Signed   By: Katherine Mantle M.D.   On: 09/01/2019 16:23   DG Chest Port 1 View  Result Date: 09/01/2019 CLINICAL DATA:  Altered mental status. EXAM: PORTABLE CHEST 1 VIEW COMPARISON:  08/15/2019 and prior radiographs FINDINGS: The cardiomediastinal silhouette is unremarkable. There is no evidence of focal airspace disease, pulmonary edema, suspicious pulmonary nodule/mass, pleural effusion, or pneumothorax. No acute bony abnormalities are identified. IMPRESSION: No active disease. Electronically Signed   By: Harmon Pier M.D.   On: 09/01/2019 17:37    EKG is pending   Assessment & Plan:    Active Problems:   Hypokalemia   Normocytic anemia   Type 2 diabetes mellitus (HCC)   Hypertension   PTSD (post-traumatic stress disorder)   Diabetic peripheral neuropathy (HCC)   Hyperlipidemia  Acute encephalopathy -Mentation currently appears to be improved, CT head with no acute finding, no infectious etiology could be identified, I will check B12, ammonia, TSH and folic acid, so far no evidence of infectious etiology (besides elevated lactic acid), will check procalcitonin. -I would think polypharmacy could be contributing especially he is on gabapentin, doxepin, Depakote and bupropion, I will hold gabapentin, doxepin, continue with Depakote and bupropion for now. -As well he is with hypokalemia which will correct to see if it helps with mental status. -Port multiple falls, for which we will consult PT/OT.   Hyperkalemia -Severe with potassium of 2.5, I think  likely him being on triamterene/hydrochlorothiazide contributing, so I will stop it as he is clinical his previous admissions as well, will replete his potassium, will check magnesium and repeat in a.m., will discontinue triamterene/hydrochlorothiazide on discharge and substitute with amlodipine.  Hypertension -Blood pressure mildly elevated, will resume home Coreg and Terazosin, and will substitute triamterene/hydrochlorothiazide with amlodipine.  PTSD -Continue with Depakote and bupropion,  hold doxepin and gabapentin for now given encephalopathy.  Type 2 diabetes mellitus -We will hold Metformin, continue with insulin sliding scale during hospital stay  Lactic acidosis -We will hold Metformin, hydrate and recheck level.  Diabetic neuropathy -Hold gabapentin due to encephalopathy.  History of COPD -No active wheezing, continue with home meds     DVT Prophylaxis Heparin   AM Labs Ordered, also please review Full Orders  Family Communication: Admission, patients condition and plan of care including tests being ordered have been discussed with the patient (left wife a voicemail) who indicate understanding and agree with the plan and Code Status.  Code Status Full  Likely DC to Home  Condition GUARDED    Consults called: None    Admission status: Observation  Time spent in minutes : 60 minutes   Huey Bienenstockawood Zhanna Melin M.D on 09/01/2019 at 6:22 PM   [facility] - Office  [phone]

## 2019-09-01 NOTE — ED Triage Notes (Signed)
Per pt wife- Pt has baseline confusion but is worse today. Pt fell out of 3 times this morning.   Pt has been having visual hallucinations today. Reports seeing flies.   Denies any other sx.

## 2019-09-01 NOTE — ED Notes (Signed)
CRITICAL VALUE ALERT  Critical Value:  Lactic acid 4.0  Date & Time Notied: 1651    Provider Notified: bero  Orders Received/Actions taken:

## 2019-09-02 DIAGNOSIS — E876 Hypokalemia: Secondary | ICD-10-CM

## 2019-09-02 DIAGNOSIS — R410 Disorientation, unspecified: Secondary | ICD-10-CM | POA: Diagnosis not present

## 2019-09-02 DIAGNOSIS — E1142 Type 2 diabetes mellitus with diabetic polyneuropathy: Secondary | ICD-10-CM

## 2019-09-02 DIAGNOSIS — I10 Essential (primary) hypertension: Secondary | ICD-10-CM

## 2019-09-02 DIAGNOSIS — E1159 Type 2 diabetes mellitus with other circulatory complications: Secondary | ICD-10-CM

## 2019-09-02 LAB — BASIC METABOLIC PANEL
Anion gap: 9 (ref 5–15)
BUN: 12 mg/dL (ref 8–23)
CO2: 28 mmol/L (ref 22–32)
Calcium: 8.1 mg/dL — ABNORMAL LOW (ref 8.9–10.3)
Chloride: 105 mmol/L (ref 98–111)
Creatinine, Ser: 0.74 mg/dL (ref 0.61–1.24)
GFR calc Af Amer: >60 mL/min (ref >60)
GFR calc non Af Amer: >60 mL/min (ref >60)
Glucose, Bld: 78 mg/dL (ref 70–99)
Potassium: 3.5 mmol/L (ref 3.5–5.1)
Sodium: 142 mmol/L (ref 135–145)

## 2019-09-02 LAB — CBC
HCT: 34.4 % — ABNORMAL LOW (ref 39.0–52.0)
Hemoglobin: 11 g/dL — ABNORMAL LOW (ref 13.0–17.0)
MCH: 30.2 pg (ref 26.0–34.0)
MCHC: 32 g/dL (ref 30.0–36.0)
MCV: 94.5 fL (ref 80.0–100.0)
Platelets: 376 10*3/uL (ref 150–400)
RBC: 3.64 MIL/uL — ABNORMAL LOW (ref 4.22–5.81)
RDW: 13.3 % (ref 11.5–15.5)
WBC: 7.7 10*3/uL (ref 4.0–10.5)
nRBC: 0 % (ref 0.0–0.2)

## 2019-09-02 LAB — GLUCOSE, CAPILLARY
Glucose-Capillary: 71 mg/dL (ref 70–99)
Glucose-Capillary: 91 mg/dL (ref 70–99)

## 2019-09-02 LAB — LACTIC ACID, PLASMA: Lactic Acid, Venous: 1.8 mmol/L (ref 0.5–1.9)

## 2019-09-02 MED ORDER — PNEUMOCOCCAL VAC POLYVALENT 25 MCG/0.5ML IJ INJ: 0.5000 mL | INJECTION | INTRAMUSCULAR | Status: DC

## 2019-09-02 MED ORDER — RISAQUAD PO CAPS
1.0000 | ORAL_CAPSULE | Freq: Two times a day (BID) | ORAL | Status: DC
  Administered 2019-09-02: 1 via ORAL
  Filled 2019-09-02: qty 1

## 2019-09-02 MED ORDER — ALUM & MAG HYDROXIDE-SIMETH 200-200-20 MG/5ML PO SUSP
30.0000 mL | ORAL | Status: DC | PRN
  Administered 2019-09-02: 30 mL via ORAL
  Filled 2019-09-02: qty 30

## 2019-09-02 NOTE — Discharge Summary (Signed)
Physician Discharge Summary  Mark Schroeder:[id number] DOB: Jun 24, 1945 DOA: 09/01/2019  PCP: [facility]  Admit date: 09/01/2019 Discharge date: 09/02/2019  Admitted From: Home Disposition: Home  Recommendations for Outpatient Follow-up:  1. Follow up with PCP in 1-2 weeks 2. Please obtain BMP/CBC in one week  Home Health: Equipment/Devices:  Discharge Condition: Stable CODE STATUS: Full code Diet recommendation: Heart healthy, carb modified  Brief/Interim Summary: Mark Schroeder  is a 74 y.o. male, COPD, type 2 diabetes with neuropathy, HTN, right BKA, HTN, HLD and PTSD with recent hospitalization discharged on 6/23, for sepsis with an identified source, COPD exacerbation, brought to ED by his wife for altered mental status, currently she is unavailable at bedside, she was left a voicemail, history was obtained mainly from ED staff and patient himself, wife noted him to have some confusion when he woke up this morning, she reports he also fell out of bed 3 times this morning, reports he has occasional falls, but does not usually happen 3 times a day, he does endorse hitting his head, so CT head was obtained with no acute findings, as well wife reported he is sleepier than usual, and she did notice him reporting some visual hallucination, stating when he was bathing he insisted that the chronic crack in the tub was actually flies on the side of the top, patient himself denies any complaints currently, denies fever, chills, polyuria, dysuria, denies any cough or shortness of breath, no wheezing. - in ED work-up was significant for hypokalemia with potassium of 2.5, patient is on Maxide, had leukocytosis of 12 K, but he recently finished his prednisone taper, lactic acid was elevated at 4, he had negative urinalysis, chest x-ray with no acute finding, CT head with no acute findings, [facility] consulted to admit for further evaluation.  Discharge Diagnoses:  Active  Problems:   Hypokalemia   Normocytic anemia   Type 2 diabetes mellitus (HCC)   Hypertension   PTSD (post-traumatic stress disorder)   Diabetic peripheral neuropathy (HCC)   Hyperlipidemia  1. Acute encephalopathy.  Likely related to some degree of volume depletion as well as polypharmacy.  He was hydrated with IV fluids.  He did not have any evidence of infection.  Procalcitonin was also negative.  CT head did not show any acute finding.  Other work-up including B12, ammonia and TSH were unrevealing.  Baclofen was discontinued.  By the following day, mental status had returned to baseline.  He did not have any neurologic deficits. 2. Hypokalemia.  Likely related to diuretics.  Potassium was replaced and is now in normal range.  Will discontinue further diuretics. 3. Lactic acidosis.  Suspect this relates some degree of dehydration as well as Metformin use.  Will discontinue further Metformin use.  Lactic acid has returned to normal range with IV fluids 4. Hypertension.  Continue on Coreg and Terazosin.  He is blood pressures currently normotensive. 5. PTSD.  Continue on Depakote and bupropion, doxepin and gabapentin.  These are all chronic medications and he reports no new changes in dosing. 6. Falls.  Patient reported falls prior to admission.  In the hospital, he was ambulated with his prosthesis and did not have any difficulty with ambulation.  Discharge Instructions  Discharge Instructions    Diet - low sodium heart healthy   Complete by: As directed    Increase activity slowly   Complete by: As directed      Allergies as of 09/02/2019   No Known Allergies  Medication List    STOP taking these medications   baclofen 10 MG tablet Commonly known as: LIORESAL   metFORMIN 500 MG tablet Commonly known as: GLUCOPHAGE   pantoprazole 40 MG tablet Commonly known as: PROTONIX   predniSONE 20 MG tablet Commonly known as: Deltasone   terazosin 2 MG capsule Commonly known as:  HYTRIN   triamterene-hydrochlorothiazide 37.5-25 MG tablet Commonly known as: MAXZIDE-25     TAKE these medications   acetaminophen 325 MG tablet Commonly known as: TYLENOL Take 2 tablets (650 mg total) by mouth every 6 (six) hours as needed for mild pain (or Fever >/= 101).   albuterol (2.5 MG/3ML) 0.083% nebulizer solution Commonly known as: PROVENTIL Take 2.5 mg by nebulization 3 (three) times daily as needed for wheezing or shortness of breath.   albuterol 108 (90 Base) MCG/ACT inhaler Commonly known as: VENTOLIN HFA Inhale 2 puffs into the lungs 4 (four) times daily.   aspirin EC 81 MG tablet Take 1 tablet (81 mg total) by mouth daily with breakfast.   atorvastatin 40 MG tablet Commonly known as: LIPITOR Take 40 mg by mouth daily.   budesonide-formoterol 160-4.5 MCG/ACT inhaler Commonly known as: SYMBICORT Inhale 2 puffs into the lungs 2 (two) times daily.   buPROPion 150 MG 12 hr tablet Commonly known as: ZYBAN Take 150 mg by mouth 2 (two) times daily.   carboxymethylcellulose 0.5 % Soln Commonly known as: REFRESH PLUS 1 drop 4 (four) times daily as needed (for dry eyes).   carvedilol 12.5 MG tablet Commonly known as: COREG Take 12.5 mg by mouth every 12 (twelve) hours.   Dexlansoprazole 30 MG capsule Take 30 mg by mouth daily.   diclofenac Sodium 1 % Gel Commonly known as: VOLTAREN Apply 4 g topically 3 (three) times daily as needed (for pain).   divalproex 250 MG 24 hr tablet Commonly known as: DEPAKOTE ER Take 250 mg by mouth daily.   doxepin 50 MG capsule Commonly known as: SINEQUAN Take 100 mg by mouth at bedtime.   famotidine 40 MG tablet Commonly known as: PEPCID Take 40 mg by mouth 2 (two) times daily.   gabapentin 300 MG capsule Commonly known as: NEURONTIN Take 600 mg by mouth in the morning and at bedtime.   lactobacillus acidophilus Tabs tablet Take 1 tablet by mouth 2 (two) times daily.   loratadine 10 MG tablet Commonly known  as: CLARITIN Take 10 mg by mouth daily.   melatonin 5 MG Tabs Take 10 mg by mouth at bedtime.   sildenafil 100 MG tablet Commonly known as: VIAGRA Take 100 mg by mouth daily as needed for erectile dysfunction. Do not take within 6 hours of Terazosin, Prazosin, or Doxazosin.   tamsulosin 0.4 MG Caps capsule Commonly known as: FLOMAX Take 0.4 mg by mouth daily.   tiotropium 18 MCG inhalation capsule Commonly known as: SPIRIVA Place 1 capsule (18 mcg total) into inhaler and inhale daily.   triamcinolone 0.025 % cream Commonly known as: KENALOG Apply 1 application topically 2 (two) times daily.   VITAMIN D PO Take 1,000 capsules by mouth daily.       No Known Allergies  Consultations:     Procedures/Studies: CT Head Wo Contrast  Result Date: 09/01/2019 CLINICAL DATA:  Confusion. Multiple recent falls. Visual hallucinations. EXAM: CT HEAD WITHOUT CONTRAST TECHNIQUE: Contiguous axial images were obtained from the base of the skull through the vertex without intravenous contrast. COMPARISON:  None. FINDINGS: Brain: No hemorrhage. No extraaxial collection.No midline shift.  There is atrophy.The basal cisterns are unremarkable. Patchy and confluent areas of decreased attenuation are noted throughout the deep and periventricular white matter of the cerebral hemispheres bilaterally, compatible with chronic microvascular ischemic disease. The brainstem is unremarkable. The cerebellum is unremarkable. The sella is unremarkable. Vascular: No hyperdense vessel or unexpected calcification. Skull: The calvarium is unremarkable. The skull base is unremarkable. The visualized upper cervical spine is unremarkable. Sinuses/Orbits: The visualized orbits are unremarkable. The paranasal sinuses are unremarkable. The mastoid air cells are clear. Other: The visualized parotid gland is unremarkable. There is no scalp soft tissue swelling. IMPRESSION: 1. No acute intracranial abnormality. 2. Atrophy and  chronic microvascular ischemic disease. Electronically Signed   By: Katherine Mantlehristopher  Green M.D.   On: 09/01/2019 16:23   CT Angio Chest PE W and/or Wo Contrast  Result Date: 08/15/2019 CLINICAL DATA:  Fever for 3 days, elevated D-dimer EXAM: CT ANGIOGRAPHY CHEST WITH CONTRAST TECHNIQUE: Multidetector CT imaging of the chest was performed using the standard protocol during bolus administration of intravenous contrast. Multiplanar CT image reconstructions and MIPs were obtained to evaluate the vascular anatomy. CONTRAST:  100mL OMNIPAQUE IOHEXOL 350 MG/ML SOLN COMPARISON:  Radiographs 08/15/2019, 04/13/2019, chest CTA 08/12/2010 (report only, images unavailable) FINDINGS: Cardiovascular: Satisfactory opacification the pulmonary arteries to the segmental level. No pulmonary artery filling defects are identified. Central pulmonary arteries are normal caliber. Atherosclerotic plaque within the normal caliber aorta. Shared origin of brachiocephalic and left common carotid artery, the origin of which is poorly visualized due to streak artifact from adjacent contrast bolus in the brachiocephalic vein. Minimal plaque elsewhere in the proximal great vessels. Normal heart size. No pericardial effusion. Coronary artery calcifications are present. Mediastinum/Nodes: No mediastinal fluid or gas. Normal thyroid gland and thoracic inlet. No acute abnormality of the trachea. Small hiatal hernia. Esophagus is otherwise unremarkable. No worrisome mediastinal, hilar or axillary adenopathy. Lungs/Pleura: There is diffuse centrilobular and paraseptal emphysematous change throughout the lungs. Some right apical pleuroparenchymal scarring is noted. There is 1.7 cm solid nodule in the right upper lobe with surrounding reticular changes and architectural distortion as well as adjacent bronchiectatic features. While this could reflect a post infectious or postinflammatory finding. Underlying malignant nodule is not fully excluded. No other  suspicious nodules or masses. Some right apical pleuroparenchymal scarring is likely present on comparison imaging as remote is 2015. Diffusely, there is mild airways thickening and scattered secretions. No pneumothorax or visible effusion. Upper Abdomen: No acute abnormalities present in the visualized portions of the upper abdomen. Musculoskeletal: Multilevel degenerative changes are present in the imaged portions of the spine and shoulders. No chest wall mass or suspicious bone lesions identified. Review of the MIP images confirms the above findings. IMPRESSION: 1. No evidence of acute pulmonary artery filling defects. 2. Diffuse mild airways thickening could reflect some acute bronchitic change or sequela of more chronic bronchitis/smoking related airways disease on a background of emphysema. ( Emphysema (ICD10-J43.9).) 3. 1.7 cm solid nodule in the right upper lobe with surrounding reticular changes and architectural distortion as well as adjacent bronchiectatic features. While this could reflect a post infectious or postinflammatory given more diffuse consolidative opacity in this location on prior, underlying malignant pulmonary nodule is not fully excluded. Consider pulmonology consultation and 3 month follow-up CT imaging as PET-CT may be complicated by background of inflammation. This recommendation follows the consensus statement: Guidelines for Management of Incidental Pulmonary Nodules Detected on CT Images: From the Fleischner Society 2017; Radiology 2017; 284:228-243. 4. Small hiatal hernia. 5. Aortic  Atherosclerosis (ICD10-I70.0). Electronically Signed   By: Kreg Shropshire M.D.   On: 08/15/2019 03:54   DG Chest Port 1 View  Result Date: 09/01/2019 CLINICAL DATA:  Altered mental status. EXAM: PORTABLE CHEST 1 VIEW COMPARISON:  08/15/2019 and prior radiographs FINDINGS: The cardiomediastinal silhouette is unremarkable. There is no evidence of focal airspace disease, pulmonary edema, suspicious  pulmonary nodule/mass, pleural effusion, or pneumothorax. No acute bony abnormalities are identified. IMPRESSION: No active disease. Electronically Signed   By: Harmon Pier M.D.   On: 09/01/2019 17:37   DG Chest Port 1 View  Result Date: 08/15/2019 CLINICAL DATA:  Shortness of breath EXAM: PORTABLE CHEST 1 VIEW COMPARISON:  Chest radiograph 04/13/2019 FINDINGS: There is mild residual scarring in the right upper lobe. No acute airspace disease. No pleural effusion or pneumothorax. IMPRESSION: Mild residual scarring in the right upper lobe. Electronically Signed   By: Deatra Robinson M.D.   On: 08/15/2019 02:04       Subjective: Patient is feeling better.  He is alert and oriented.  Wants to go home.  Discharge Exam: Vitals:   09/02/19 0408 09/02/19 0824 09/02/19 0909 09/02/19 1450  BP: 106/78 117/65  120/72  Pulse: 81 91  85  Resp: 18   19  Temp: 98.8 F (37.1 C)   98.2 F (36.8 C)  TempSrc: Oral     SpO2: 95%  90% 95%  Weight:      Height:        General: Pt is alert, awake, not in acute distress Cardiovascular: RRR, S1/S2 +, no rubs, no gallops Respiratory: CTA bilaterally, no wheezing, no rhonchi Abdominal: Soft, NT, ND, bowel sounds + Extremities: no edema, no cyanosis, right below the knee amputation    The results of significant diagnostics from this hospitalization (including imaging, microbiology, ancillary and laboratory) are listed below for reference.     Microbiology: Recent Results (from the past 240 hour(s))  SARS Coronavirus 2 by RT PCR (hospital order, performed in [facility] hospital lab) Nasopharyngeal Nasopharyngeal Swab     Status: None   Collection Time: 09/01/19  5:47 PM   Specimen: Nasopharyngeal Swab  Result Value Ref Range Status   SARS Coronavirus 2 NEGATIVE NEGATIVE Final    Comment: (NOTE) SARS-CoV-2 target nucleic acids are NOT DETECTED.  The SARS-CoV-2 RNA is generally detectable in upper and lower respiratory specimens during the acute  phase of infection. The lowest concentration of SARS-CoV-2 viral copies this assay can detect is 250 copies / mL. A negative result does not preclude SARS-CoV-2 infection and should not be used as the sole basis for treatment or other patient management decisions.  A negative result may occur with improper specimen collection / handling, submission of specimen other than nasopharyngeal swab, presence of viral mutation(s) within the areas targeted by this assay, and inadequate number of viral copies (<250 copies / mL). A negative result must be combined with clinical observations, patient history, and epidemiological information.  Fact Sheet for Patients:   BoilerBrush.com.cy  Fact Sheet for Healthcare Providers: https://pope.com/  This test is not yet approved or  cleared by the [place] FDA and has been authorized for detection and/or diagnosis of SARS-CoV-2 by FDA under an Emergency Use Authorization (EUA).  This EUA will remain in effect (meaning this test can be used) for the duration of the COVID-19 declaration under Section 564(b)(1) of the Act, 21 U.S.C. section 360bbb-3(b)(1), unless the authorization is terminated or revoked sooner.  Performed at [facility], [phone]  [address] [place], [place] [address]   Blood culture (routine x 2)     Status: None (Preliminary result)   Collection Time: 09/01/19  6:01 PM   Specimen: BLOOD RIGHT HAND  Result Value Ref Range Status   Specimen Description BLOOD RIGHT HAND  Final   Special Requests   Final    BOTTLES DRAWN AEROBIC ONLY Blood Culture adequate volume Performed at [facility], [address], [place], [place] [address]    Culture PENDING  Incomplete   Report Status PENDING  Incomplete  Blood culture (routine x 2)     Status: None (Preliminary result)   Collection Time: 09/01/19  7:05 PM   Specimen: BLOOD LEFT ARM  Result Value Ref Range Status   Specimen Description BLOOD  LEFT ARM  Final   Special Requests   Final    BOTTLES DRAWN AEROBIC AND ANAEROBIC Blood Culture adequate volume Performed at [facility], [address], [place], [place] [address]    Culture PENDING  Incomplete   Report Status PENDING  Incomplete     Labs: BNP (last 3 results) No results for input(s): BNP in the last 8760 hours. Basic Metabolic Panel: Recent Labs  Lab 09/01/19 1616 09/01/19 1803 09/02/19 0654  NA 139  --  142  K 2.5*  --  3.5  CL 94*  --  105  CO2 32  --  28  GLUCOSE 98  --  78  BUN 13  --  12  CREATININE 0.88  --  0.74  CALCIUM 9.1  --  8.1*  MG  --  1.8  --    Liver Function Tests: Recent Labs  Lab 09/01/19 1616  AST 15  ALT 17  ALKPHOS 41  BILITOT 0.6  PROT 6.9  ALBUMIN 3.5   No results for input(s): LIPASE, AMYLASE in the last 168 hours. Recent Labs  Lab 09/01/19 1905  AMMONIA 19   CBC: Recent Labs  Lab 09/01/19 1558 09/02/19 0654  WBC 12.7* 7.7  NEUTROABS 9.0*  --   HGB 12.3* 11.0*  HCT 38.0* 34.4*  MCV 93.1 94.5  PLT 410* 376   Cardiac Enzymes: No results for input(s): CKTOTAL, CKMB, CKMBINDEX, TROPONINI in the last 168 hours. BNP: Invalid input(s): POCBNP CBG: Recent Labs  Lab 09/01/19 1607 09/01/19 2201 09/02/19 0820 09/02/19 1121  GLUCAP 89 89 71 91   D-Dimer No results for input(s): DDIMER in the last 72 hours. Hgb A1c No results for input(s): HGBA1C in the last 72 hours. Lipid Profile No results for input(s): CHOL, HDL, LDLCALC, TRIG, CHOLHDL, LDLDIRECT in the last 72 hours. Thyroid function studies Recent Labs    09/01/19 1905  TSH 0.319*   Anemia work up Recent Labs    09/01/19 1905  VITAMINB12 444  FOLATE 7.8   Urinalysis    Component Value Date/Time   COLORURINE YELLOW 09/01/2019 1531   APPEARANCEUR CLEAR 09/01/2019 1531   LABSPEC 1.015 09/01/2019 1531   PHURINE 6.0 09/01/2019 1531   GLUCOSEU NEGATIVE 09/01/2019 1531   HGBUR NEGATIVE 09/01/2019 1531   BILIRUBINUR NEGATIVE 09/01/2019 1531    KETONESUR NEGATIVE 09/01/2019 1531   PROTEINUR NEGATIVE 09/01/2019 1531   UROBILINOGEN >8.0 (H) 08/15/2014 1134   NITRITE NEGATIVE 09/01/2019 1531   LEUKOCYTESUR NEGATIVE 09/01/2019 1531   Sepsis Labs Invalid input(s): PROCALCITONIN,  WBC,  LACTICIDVEN Microbiology Recent Results (from the past 240 hour(s))  SARS Coronavirus 2 by RT PCR (hospital order, performed in [facility] hospital lab) Nasopharyngeal Nasopharyngeal Swab  Status: None   Collection Time: 09/01/19  5:47 PM   Specimen: Nasopharyngeal Swab  Result Value Ref Range Status   SARS Coronavirus 2 NEGATIVE NEGATIVE Final    Comment: (NOTE) SARS-CoV-2 target nucleic acids are NOT DETECTED.  The SARS-CoV-2 RNA is generally detectable in upper and lower respiratory specimens during the acute phase of infection. The lowest concentration of SARS-CoV-2 viral copies this assay can detect is 250 copies / mL. A negative result does not preclude SARS-CoV-2 infection and should not be used as the sole basis for treatment or other patient management decisions.  A negative result may occur with improper specimen collection / handling, submission of specimen other than nasopharyngeal swab, presence of viral mutation(s) within the areas targeted by this assay, and inadequate number of viral copies (<250 copies / mL). A negative result must be combined with clinical observations, patient history, and epidemiological information.  Fact Sheet for Patients:   BoilerBrush.com.cy  Fact Sheet for Healthcare Providers: https://pope.com/  This test is not yet approved or  cleared by the [place] FDA and has been authorized for detection and/or diagnosis of SARS-CoV-2 by FDA under an Emergency Use Authorization (EUA).  This EUA will remain in effect (meaning this test can be used) for the duration of the COVID-19 declaration under Section 564(b)(1) of the Act, 21 U.S.C. section  360bbb-3(b)(1), unless the authorization is terminated or revoked sooner.  Performed at [facility], [address] [place], [place] [address]   Blood culture (routine x 2)     Status: None (Preliminary result)   Collection Time: 09/01/19  6:01 PM   Specimen: BLOOD RIGHT HAND  Result Value Ref Range Status   Specimen Description BLOOD RIGHT HAND  Final   Special Requests   Final    BOTTLES DRAWN AEROBIC ONLY Blood Culture adequate volume Performed at [facility], [address], [place], [place] [address]    Culture PENDING  Incomplete   Report Status PENDING  Incomplete  Blood culture (routine x 2)     Status: None (Preliminary result)   Collection Time: 09/01/19  7:05 PM   Specimen: BLOOD LEFT ARM  Result Value Ref Range Status   Specimen Description BLOOD LEFT ARM  Final   Special Requests   Final    BOTTLES DRAWN AEROBIC AND ANAEROBIC Blood Culture adequate volume Performed at [facility], [address], [place], [place] [address]    Culture PENDING  Incomplete   Report Status PENDING  Incomplete     Time coordinating discharge:  SIGNED:   Erick Blinks, MD  [facility] 09/02/2019, 7:52 PM   If 7PM-7AM, please contact night-coverage [email or web address]

## 2019-09-06 LAB — CULTURE, BLOOD (ROUTINE X 2)
Culture: NO GROWTH
Culture: NO GROWTH
Special Requests: ADEQUATE
Special Requests: ADEQUATE

## 2019-09-20 ENCOUNTER — Other Ambulatory Visit: Payer: Self-pay

## 2019-09-20 ENCOUNTER — Encounter (HOSPITAL_COMMUNITY): Payer: Self-pay

## 2019-09-20 DIAGNOSIS — R531 Weakness: Secondary | ICD-10-CM | POA: Diagnosis present

## 2019-09-20 DIAGNOSIS — R748 Abnormal levels of other serum enzymes: Secondary | ICD-10-CM | POA: Diagnosis not present

## 2019-09-20 DIAGNOSIS — D649 Anemia, unspecified: Secondary | ICD-10-CM | POA: Diagnosis not present

## 2019-09-20 DIAGNOSIS — J441 Chronic obstructive pulmonary disease with (acute) exacerbation: Secondary | ICD-10-CM | POA: Insufficient documentation

## 2019-09-20 DIAGNOSIS — Z7982 Long term (current) use of aspirin: Secondary | ICD-10-CM | POA: Diagnosis not present

## 2019-09-20 DIAGNOSIS — E876 Hypokalemia: Secondary | ICD-10-CM | POA: Insufficient documentation

## 2019-09-20 DIAGNOSIS — R296 Repeated falls: Secondary | ICD-10-CM | POA: Insufficient documentation

## 2019-09-20 DIAGNOSIS — Z87891 Personal history of nicotine dependence: Secondary | ICD-10-CM | POA: Insufficient documentation

## 2019-09-20 DIAGNOSIS — I10 Essential (primary) hypertension: Secondary | ICD-10-CM | POA: Insufficient documentation

## 2019-09-20 DIAGNOSIS — E1140 Type 2 diabetes mellitus with diabetic neuropathy, unspecified: Secondary | ICD-10-CM | POA: Insufficient documentation

## 2019-09-20 NOTE — ED Triage Notes (Signed)
Pt here for frequent falls related to weakness. 12x in last 3 weeks. Here on the 10th fro the same  Denies any injuries .

## 2019-09-21 ENCOUNTER — Emergency Department (HOSPITAL_COMMUNITY): Payer: No Typology Code available for payment source

## 2019-09-21 ENCOUNTER — Emergency Department (HOSPITAL_COMMUNITY)
Admission: EM | Admit: 2019-09-21 | Discharge: 2019-09-21 | Disposition: A | Discharge status: Home / Self Care | Payer: No Typology Code available for payment source | Attending: Emergency Medicine | Admitting: Emergency Medicine

## 2019-09-21 DIAGNOSIS — R296 Repeated falls: Secondary | ICD-10-CM

## 2019-09-21 DIAGNOSIS — D649 Anemia, unspecified: Secondary | ICD-10-CM

## 2019-09-21 DIAGNOSIS — E876 Hypokalemia: Secondary | ICD-10-CM

## 2019-09-21 DIAGNOSIS — R748 Abnormal levels of other serum enzymes: Secondary | ICD-10-CM

## 2019-09-21 LAB — CBC WITH DIFFERENTIAL/PLATELET
Abs Immature Granulocytes: 0.02 10*3/uL (ref 0.00–0.07)
Basophils Absolute: 0 10*3/uL (ref 0.0–0.1)
Basophils Relative: 0 %
Eosinophils Absolute: 0.3 10*3/uL (ref 0.0–0.5)
Eosinophils Relative: 4 %
HCT: 37.5 % — ABNORMAL LOW (ref 39.0–52.0)
Hemoglobin: 11.4 g/dL — ABNORMAL LOW (ref 13.0–17.0)
Immature Granulocytes: 0 %
Lymphocytes Relative: 36 %
Lymphs Abs: 2.2 10*3/uL (ref 0.7–4.0)
MCH: 29.8 pg (ref 26.0–34.0)
MCHC: 30.4 g/dL (ref 30.0–36.0)
MCV: 97.9 fL (ref 80.0–100.0)
Monocytes Absolute: 0.7 10*3/uL (ref 0.1–1.0)
Monocytes Relative: 11 %
Neutro Abs: 3 10*3/uL (ref 1.7–7.7)
Neutrophils Relative %: 49 %
Platelets: 388 10*3/uL (ref 150–400)
RBC: 3.83 MIL/uL — ABNORMAL LOW (ref 4.22–5.81)
RDW: 13.9 % (ref 11.5–15.5)
WBC: 6.1 10*3/uL (ref 4.0–10.5)
nRBC: 0 % (ref 0.0–0.2)

## 2019-09-21 LAB — CK: Total CK: 1719 U/L — ABNORMAL HIGH (ref 49–397)

## 2019-09-21 LAB — MAGNESIUM: Magnesium: 1.9 mg/dL (ref 1.7–2.4)

## 2019-09-21 LAB — COMPREHENSIVE METABOLIC PANEL
ALT: 25 U/L (ref 0–44)
AST: 34 U/L (ref 15–41)
Albumin: 3.4 g/dL — ABNORMAL LOW (ref 3.5–5.0)
Alkaline Phosphatase: 45 U/L (ref 38–126)
Anion gap: 10 (ref 5–15)
BUN: 11 mg/dL (ref 8–23)
CO2: 29 mmol/L (ref 22–32)
Calcium: 8.7 mg/dL — ABNORMAL LOW (ref 8.9–10.3)
Chloride: 103 mmol/L (ref 98–111)
Creatinine, Ser: 0.78 mg/dL (ref 0.61–1.24)
GFR calc Af Amer: >60 mL/min (ref >60)
GFR calc non Af Amer: >60 mL/min (ref >60)
Glucose, Bld: 86 mg/dL (ref 70–99)
Potassium: 3.3 mmol/L — ABNORMAL LOW (ref 3.5–5.1)
Sodium: 142 mmol/L (ref 135–145)
Total Bilirubin: 0.5 mg/dL (ref 0.3–1.2)
Total Protein: 7.2 g/dL (ref 6.5–8.1)

## 2019-09-21 LAB — LACTIC ACID, PLASMA
Lactic Acid, Venous: 2.2 mmol/L (ref 0.5–1.9)
Lactic Acid, Venous: 1.3 mmol/L (ref 0.5–1.9)

## 2019-09-21 LAB — URINALYSIS, ROUTINE W REFLEX MICROSCOPIC
Bilirubin Urine: NEGATIVE
Glucose, UA: NEGATIVE mg/dL
Hgb urine dipstick: NEGATIVE
Ketones, ur: NEGATIVE mg/dL
Leukocytes,Ua: NEGATIVE
Nitrite: NEGATIVE
Protein, ur: NEGATIVE mg/dL
Specific Gravity, Urine: 1.025 (ref 1.005–1.030)
pH: 6 (ref 5.0–8.0)

## 2019-09-21 MED ORDER — POTASSIUM CHLORIDE CRYS ER 20 MEQ PO TBCR
20.0000 meq | EXTENDED_RELEASE_TABLET | Freq: Two times a day (BID) | ORAL | 0 refills | Status: DC
Start: 2019-09-21 — End: 2020-03-08

## 2019-09-21 MED ORDER — POTASSIUM CHLORIDE CRYS ER 20 MEQ PO TBCR
40.0000 meq | EXTENDED_RELEASE_TABLET | Freq: Once | ORAL | Status: AC
  Administered 2019-09-21: 40 meq via ORAL
  Filled 2019-09-21: qty 2

## 2019-09-21 MED ORDER — SODIUM CHLORIDE 0.9 % IV BOLUS
1000.0000 mL | Freq: Once | INTRAVENOUS | Status: AC
  Administered 2019-09-21: 1000 mL via INTRAVENOUS

## 2019-09-21 NOTE — ED Notes (Signed)
Date and time results received: 09/21/19  0310  Test: Lactic Acid Critical Value: 2.2  Name of Provider Notified: Dr. Preston Fleeting  Orders Received? Or Actions Taken?: n/a

## 2019-09-21 NOTE — Discharge Instructions (Addendum)
Stop taking atorvastatin - it may be affecting your muscles.  Please talk with your primary care provider - they might want to give you another medication for your cholesterol.  Return if you are having any problems.

## 2019-09-21 NOTE — ED Provider Notes (Signed)
[facility] EMERGENCY DEPARTMENT Provider Note   CSN: [id number] Arrival date & time: 09/20/19  1854   History Chief Complaint  Patient presents with  . Fall    Mark Schroeder is a 74 y.o. male.  The history is provided by the patient.  Fall  He has history of hypertension, diabetes, hyperlipidemia, COPD and comes in because of weakness and frequent falls.  He had been in the hospital 3 weeks ago with similar complaints.  On discharge, wife states that he was doing better, but over the ensuing 1-2 weeks, he had recurrence of his weakness.  He has fallen multiple times today.  He denies injury in any of the falls.  He denies feeling dizzy or lightheaded but does state he feels weak.  There has been no chest pain.  Denies any nausea or vomiting.  There has been no difficulty with bowels or bladder.  Past Medical History:  Diagnosis Date  . COPD (chronic obstructive pulmonary disease) (HCC)   . Depression   . Diabetic peripheral neuropathy (HCC)   . Hyperlipidemia   . Hypertension   . PTSD (post-traumatic stress disorder)   . Type 2 diabetes mellitus [facility])     Patient Active Problem List   Diagnosis Date Noted  . Acute respiratory failure with hypoxia (HCC)   . Sepsis due to undetermined organism (HCC) 08/15/2019  . Hyperbilirubinemia 04/13/2019  . Hypertension   . Depression   . PTSD (post-traumatic stress disorder)   . Diabetic peripheral neuropathy (HCC)   . Hyperlipidemia   . CAP (community acquired pneumonia) 08/15/2014  . COPD with exacerbation (HCC) 08/15/2014  . Hypokalemia 08/15/2014  . Acute kidney injury (HCC) 08/15/2014  . Normocytic anemia 08/15/2014  . Type 2 diabetes mellitus (HCC) 08/15/2014    Past Surgical History:  Procedure Laterality Date  . 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 Titanium Rod in left leg from knee to ankle Left 03/2011       Family History  Problem Relation Age of Onset  . Hypertension  Mother     Social History   Tobacco Use  . Smoking status: Former Smoker    Packs/day: 0.25    Years: 35.00    Pack years: 8.75    Types: Cigarettes  . Smokeless tobacco: Never Used  Substance Use Topics  . Alcohol use: No  . Drug use: No    Home Medications Prior to Admission medications   Medication Sig Start Date End Date Taking? Authorizing Provider  acetaminophen (TYLENOL) 325 MG tablet Take 2 tablets (650 mg total) by mouth every 6 (six) hours as needed for mild pain (or Fever >/= 101). 04/17/19   Emokpae, Courage, MD  albuterol (PROVENTIL HFA;VENTOLIN HFA) 108 (90 BASE) MCG/ACT inhaler Inhale 2 puffs into the lungs 4 (four) times daily.    Carles Collet, MD  albuterol (PROVENTIL) (2.5 MG/3ML) 0.083% nebulizer solution Take 2.5 mg by nebulization 3 (three) times daily as needed for wheezing or shortness of breath.    [provider]  aspirin EC 81 MG tablet Take 1 tablet (81 mg total) by mouth daily with breakfast. 04/17/19   Mariea Clonts, Courage, MD  atorvastatin (LIPITOR) 40 MG tablet Take 40 mg by mouth daily.    [provider]  budesonide-formoterol (SYMBICORT) 160-4.5 MCG/ACT inhaler Inhale 2 puffs into the lungs 2 (two) times daily. 04/17/19   Shon Hale, MD  buPROPion (ZYBAN) 150 MG 12 hr tablet Take 150 mg  by mouth 2 (two) times daily.    [provider]  carboxymethylcellulose (REFRESH PLUS) 0.5 % SOLN 1 drop 4 (four) times daily as needed (for dry eyes).    [provider]  carvedilol (COREG) 12.5 MG tablet Take 12.5 mg by mouth every 12 (twelve) hours.    [provider]  Cholecalciferol (VITAMIN D PO) Take 1,000 capsules by mouth daily.     [provider]  Dexlansoprazole 30 MG capsule Take 30 mg by mouth daily.    [provider]  diclofenac Sodium (VOLTAREN) 1 % GEL Apply 4 g topically 3 (three) times daily as needed (for pain).    [provider]  divalproex (DEPAKOTE ER) 250 MG 24 hr  tablet Take 250 mg by mouth daily.    [provider]  doxepin (SINEQUAN) 50 MG capsule Take 100 mg by mouth at bedtime.    [provider]  famotidine (PEPCID) 40 MG tablet Take 40 mg by mouth 2 (two) times daily.    [provider]  gabapentin (NEURONTIN) 300 MG capsule Take 600 mg by mouth in the morning and at bedtime.     [provider]  lactobacillus acidophilus (BACID) TABS tablet Take 1 tablet by mouth 2 (two) times daily.    [provider]  loratadine (CLARITIN) 10 MG tablet Take 10 mg by mouth daily.    [provider]  Melatonin 5 MG TABS Take 10 mg by mouth at bedtime.    [provider]  sildenafil (VIAGRA) 100 MG tablet Take 100 mg by mouth daily as needed for erectile dysfunction. Do not take within 6 hours of Terazosin, Prazosin, or Doxazosin.    [provider]  tamsulosin (FLOMAX) 0.4 MG CAPS capsule Take 0.4 mg by mouth daily.    [provider]  tiotropium (SPIRIVA) 18 MCG inhalation capsule Place 1 capsule (18 mcg total) into inhaler and inhale daily. 04/17/19   Shon Hale, MD  triamcinolone (KENALOG) 0.025 % cream Apply 1 application topically 2 (two) times daily.    [provider]    Allergies    Patient has no known allergies.  Review of Systems   Review of Systems  All other systems reviewed and are negative.   Physical Exam Updated Vital Signs BP 119/73   Pulse 90   Temp 98.4 F (36.9 C)   Resp 19   Ht 5\' 4"  (1.626 m)   Wt 72.6 kg   SpO2 96%   BMI 27.47 kg/m   Physical Exam Vitals and nursing note reviewed.   74 year old male, resting comfortably and in no acute distress. Vital signs are normal. Oxygen saturation is 96%, which is normal. Head is normocephalic and atraumatic. PERRLA, EOMI. Oropharynx is clear. Neck is nontender and supple without adenopathy or JVD. Back is nontender and there is no CVA tenderness. Lungs are clear without rales, wheezes,  or rhonchi. Chest is nontender. Heart has regular rate and rhythm without murmur. Abdomen is soft, flat, nontender without masses or hepatosplenomegaly and peristalsis is normoactive. Extremities: Right below the knee amputation, no edema, full range of motion is present. Skin is warm and dry without rash. Neurologic: Mental status is normal, cranial nerves are intact.  Strength is 5/5 in both arms, 4/5 in both legs.  ED Results / Procedures / Treatments   Labs (all labs ordered are listed, but only abnormal results are displayed) Labs Reviewed  COMPREHENSIVE METABOLIC PANEL - Abnormal; Notable for the following components:  Result Value   Potassium 3.3 (*)    Calcium 8.7 (*)    Albumin 3.4 (*)    All other components within normal limits  CBC WITH DIFFERENTIAL/PLATELET - Abnormal; Notable for the following components:   RBC 3.83 (*)    Hemoglobin 11.4 (*)    HCT 37.5 (*)    All other components within normal limits  LACTIC ACID, PLASMA - Abnormal; Notable for the following components:   Lactic Acid, Venous 2.2 (*)    All other components within normal limits  CK - Abnormal; Notable for the following components:   Total CK 1,719 (*)    All other components within normal limits  URINALYSIS, ROUTINE W REFLEX MICROSCOPIC  MAGNESIUM  LACTIC ACID, PLASMA    Radiology CT Head Wo Contrast  Result Date: 09/21/2019 CLINICAL DATA:  Altered mental status for several days EXAM: CT HEAD WITHOUT CONTRAST TECHNIQUE: Contiguous axial images were obtained from the base of the skull through the vertex without intravenous contrast. COMPARISON:  09/01/2019 FINDINGS: Brain: No evidence of acute infarction, hemorrhage, hydrocephalus, extra-axial collection or mass lesion/mass effect. Mild atrophic changes and chronic white matter ischemic changes are again seen. Vascular: No hyperdense vessel or unexpected calcification. Skull: Normal. Negative for fracture or focal lesion. Sinuses/Orbits: No  acute finding. Other: None. IMPRESSION: Chronic changes without acute abnormality. Electronically Signed   By: Alcide Clever M.D.   On: 09/21/2019 01:16   DG Chest Port 1 View  Result Date: 09/21/2019 CLINICAL DATA:  Weakness EXAM: PORTABLE CHEST 1 VIEW COMPARISON:  09/01/2018 FINDINGS: The heart size and mediastinal contours are within normal limits. Both lungs are clear. The visualized skeletal structures are unremarkable. IMPRESSION: No active disease. Electronically Signed   By: Alcide Clever M.D.   On: 09/21/2019 01:09    Procedures Procedures  Medications Ordered in ED Medications  sodium chloride 0.9 % bolus 1,000 mL (0 mLs Intravenous Stopped 09/21/19 0541)  potassium chloride SA (KLOR-CON) CR tablet 40 mEq (40 mEq Oral Given 09/21/19 0400)    ED Course  I have reviewed the triage vital signs and the nursing notes.  Pertinent labs & imaging results that were available during my care of the patient were reviewed by me and considered in my medical decision making (see chart for details).  MDM Rules/Calculators/A&P Weakness with multiple falls, etiology unclear.  Old records are reviewed confirming hospitalization 7/10-7/11 for weakness and falls at which time potassium was noted to be 2.5.  He was taken off of diuretics.  He also had a mildly elevated lactic acid level and he was taken off of Metformin.  We will need to repeat electrolytes, will also look for occult infection with chest x-ray and urinalysis.  Will recheck lactic acid level.  Will check magnesium level.  Also, it is noted that he is on a statin and will need to check CK level.  Potassium has come back slightly low and he is given supplemental potassium.  Anemia is present and unchanged from baseline.  CK is moderately elevated and may account for his weakness.  He is advised to discontinue atorvastatin.  He will need to follow-up with his primary care provider who may choose to put him on a nonstatin lipid-lowering agent.   He was ambulated in the ED and noted to have a steady gait, and is felt to be safe for discharge.  Final Clinical Impression(s) / ED Diagnoses Final diagnoses:  Multiple falls  Elevated CK  Hypokalemia  Normochromic normocytic anemia  Rx / DC Orders ED Discharge Orders         Ordered    potassium chloride SA (KLOR-CON) 20 MEQ tablet  2 times daily     Discontinue  Reprint     09/21/19 0718           Dione BoozeGlick, Teagyn Fishel, MD 09/21/19 [id number]

## 2019-09-21 NOTE — ED Notes (Signed)
Ambulated pt in hallway, pt with steady gate and in NAD.

## 2020-03-05 ENCOUNTER — Other Ambulatory Visit: Payer: Self-pay

## 2020-03-05 ENCOUNTER — Emergency Department (HOSPITAL_COMMUNITY): Payer: No Typology Code available for payment source

## 2020-03-05 ENCOUNTER — Inpatient Hospital Stay (HOSPITAL_COMMUNITY): Payer: No Typology Code available for payment source

## 2020-03-05 ENCOUNTER — Inpatient Hospital Stay (HOSPITAL_COMMUNITY)
Admission: EM | Admit: 2020-03-05 | Discharge: 2020-03-08 | DRG: 177 | Disposition: A | Discharge status: Home Health | Payer: No Typology Code available for payment source | Attending: Internal Medicine | Admitting: Internal Medicine

## 2020-03-05 ENCOUNTER — Encounter (HOSPITAL_COMMUNITY): Payer: Self-pay

## 2020-03-05 DIAGNOSIS — G9341 Metabolic encephalopathy: Secondary | ICD-10-CM

## 2020-03-05 DIAGNOSIS — J441 Chronic obstructive pulmonary disease with (acute) exacerbation: Secondary | ICD-10-CM

## 2020-03-05 DIAGNOSIS — Z89511 Acquired absence of right leg below knee: Secondary | ICD-10-CM | POA: Diagnosis not present

## 2020-03-05 DIAGNOSIS — F32A Depression, unspecified: Secondary | ICD-10-CM | POA: Diagnosis present

## 2020-03-05 DIAGNOSIS — J1282 Pneumonia due to coronavirus disease 2019: Secondary | ICD-10-CM | POA: Diagnosis present

## 2020-03-05 DIAGNOSIS — E785 Hyperlipidemia, unspecified: Secondary | ICD-10-CM | POA: Diagnosis present

## 2020-03-05 DIAGNOSIS — Z7951 Long term (current) use of inhaled steroids: Secondary | ICD-10-CM | POA: Diagnosis not present

## 2020-03-05 DIAGNOSIS — Z7982 Long term (current) use of aspirin: Secondary | ICD-10-CM | POA: Diagnosis not present

## 2020-03-05 DIAGNOSIS — Z8249 Family history of ischemic heart disease and other diseases of the circulatory system: Secondary | ICD-10-CM

## 2020-03-05 DIAGNOSIS — I10 Essential (primary) hypertension: Secondary | ICD-10-CM | POA: Diagnosis present

## 2020-03-05 DIAGNOSIS — E1142 Type 2 diabetes mellitus with diabetic polyneuropathy: Secondary | ICD-10-CM | POA: Diagnosis present

## 2020-03-05 DIAGNOSIS — J9601 Acute respiratory failure with hypoxia: Secondary | ICD-10-CM | POA: Diagnosis present

## 2020-03-05 DIAGNOSIS — J440 Chronic obstructive pulmonary disease with acute lower respiratory infection: Secondary | ICD-10-CM | POA: Diagnosis present

## 2020-03-05 DIAGNOSIS — F4310 Post-traumatic stress disorder, unspecified: Secondary | ICD-10-CM | POA: Diagnosis present

## 2020-03-05 DIAGNOSIS — U071 COVID-19: Principal | ICD-10-CM | POA: Diagnosis present

## 2020-03-05 DIAGNOSIS — Z87891 Personal history of nicotine dependence: Secondary | ICD-10-CM

## 2020-03-05 DIAGNOSIS — E1159 Type 2 diabetes mellitus with other circulatory complications: Secondary | ICD-10-CM

## 2020-03-05 DIAGNOSIS — E876 Hypokalemia: Secondary | ICD-10-CM

## 2020-03-05 DIAGNOSIS — Z79899 Other long term (current) drug therapy: Secondary | ICD-10-CM

## 2020-03-05 DIAGNOSIS — E119 Type 2 diabetes mellitus without complications: Secondary | ICD-10-CM

## 2020-03-05 LAB — RAPID URINE DRUG SCREEN, HOSP PERFORMED
Amphetamines: NOT DETECTED
Barbiturates: NOT DETECTED
Benzodiazepines: NOT DETECTED
Cocaine: NOT DETECTED
Opiates: NOT DETECTED
Tetrahydrocannabinol: NOT DETECTED

## 2020-03-05 LAB — BLOOD GAS, ARTERIAL
Acid-Base Excess: 7.4 mmol/L — ABNORMAL HIGH (ref 0.0–2.0)
Bicarbonate: 30 mmol/L — ABNORMAL HIGH (ref 20.0–28.0)
FIO2: 32
O2 Saturation: 96.2 %
Patient temperature: 36.5
pCO2 arterial: 58.9 mmHg — ABNORMAL HIGH (ref 32.0–48.0)
pH, Arterial: 7.362 (ref 7.350–7.450)
pO2, Arterial: 90.8 mmHg (ref 83.0–108.0)

## 2020-03-05 LAB — CBC WITH DIFFERENTIAL/PLATELET
Basophils Absolute: 0 10*3/uL (ref 0.0–0.1)
Basophils Relative: 0 %
Eosinophils Absolute: 0 10*3/uL (ref 0.0–0.5)
Eosinophils Relative: 0 %
HCT: 45.3 % (ref 39.0–52.0)
Hemoglobin: 14.2 g/dL (ref 13.0–17.0)
Lymphocytes Relative: 24 %
Lymphs Abs: 1.4 10*3/uL (ref 0.7–4.0)
MCH: 28.6 pg (ref 26.0–34.0)
MCHC: 31.3 g/dL (ref 30.0–36.0)
MCV: 91.1 fL (ref 80.0–100.0)
Monocytes Absolute: 0.3 10*3/uL (ref 0.1–1.0)
Monocytes Relative: 5 %
Neutro Abs: 4.2 10*3/uL (ref 1.7–7.7)
Neutrophils Relative %: 71 %
Platelets: 269 10*3/uL (ref 150–400)
RBC: 4.97 MIL/uL (ref 4.22–5.81)
RDW: 13.6 % (ref 11.5–15.5)
WBC: 5.9 10*3/uL (ref 4.0–10.5)
nRBC: 0 % (ref 0.0–0.2)

## 2020-03-05 LAB — COMPREHENSIVE METABOLIC PANEL
ALT: 23 U/L (ref 0–44)
AST: 50 U/L — ABNORMAL HIGH (ref 15–41)
Albumin: 3.7 g/dL (ref 3.5–5.0)
Alkaline Phosphatase: 40 U/L (ref 38–126)
Anion gap: 14 (ref 5–15)
BUN: 15 mg/dL (ref 8–23)
CO2: 34 mmol/L — ABNORMAL HIGH (ref 22–32)
Calcium: 8.9 mg/dL (ref 8.9–10.3)
Chloride: 91 mmol/L — ABNORMAL LOW (ref 98–111)
Creatinine, Ser: 1.15 mg/dL (ref 0.61–1.24)
GFR, Estimated: >60 mL/min (ref >60)
Glucose, Bld: 109 mg/dL — ABNORMAL HIGH (ref 70–99)
Potassium: 2.5 mmol/L — CL (ref 3.5–5.1)
Sodium: 139 mmol/L (ref 135–145)
Total Bilirubin: 1.2 mg/dL (ref 0.3–1.2)
Total Protein: 8.3 g/dL — ABNORMAL HIGH (ref 6.5–8.1)

## 2020-03-05 LAB — PROCALCITONIN: Procalcitonin: <0.1 ng/mL

## 2020-03-05 LAB — TROPONIN I (HIGH SENSITIVITY)
Troponin I (High Sensitivity): 20 ng/L — ABNORMAL HIGH (ref <18)
Troponin I (High Sensitivity): 20 ng/L — ABNORMAL HIGH (ref <18)

## 2020-03-05 LAB — CBG MONITORING, ED
Glucose-Capillary: 118 mg/dL — ABNORMAL HIGH (ref 70–99)
Glucose-Capillary: 119 mg/dL — ABNORMAL HIGH (ref 70–99)
Glucose-Capillary: 139 mg/dL — ABNORMAL HIGH (ref 70–99)

## 2020-03-05 LAB — MAGNESIUM: Magnesium: 2.5 mg/dL — ABNORMAL HIGH (ref 1.7–2.4)

## 2020-03-05 LAB — LACTATE DEHYDROGENASE: LDH: 471 U/L — ABNORMAL HIGH (ref 98–192)

## 2020-03-05 LAB — C-REACTIVE PROTEIN: CRP: 11 mg/dL — ABNORMAL HIGH (ref <1.0)

## 2020-03-05 LAB — LACTIC ACID, PLASMA
Lactic Acid, Venous: 1.8 mmol/L (ref 0.5–1.9)
Lactic Acid, Venous: 0.9 mmol/L (ref 0.5–1.9)

## 2020-03-05 LAB — ETHANOL: Alcohol, Ethyl (B): <10 mg/dL (ref <10)

## 2020-03-05 LAB — FIBRINOGEN: Fibrinogen: 507 mg/dL — ABNORMAL HIGH (ref 210–475)

## 2020-03-05 LAB — POC SARS CORONAVIRUS 2 AG -  ED: SARS Coronavirus 2 Ag: POSITIVE — AB

## 2020-03-05 LAB — D-DIMER, QUANTITATIVE: D-Dimer, Quant: 0.78 ug/mL-FEU — ABNORMAL HIGH (ref 0.00–0.50)

## 2020-03-05 LAB — TRIGLYCERIDES: Triglycerides: 187 mg/dL — ABNORMAL HIGH (ref <150)

## 2020-03-05 LAB — FERRITIN: Ferritin: 344 ng/mL — ABNORMAL HIGH (ref 24–336)

## 2020-03-05 MED ORDER — TOCILIZUMAB 400 MG/20ML IV SOLN
8.0000 mg/kg | Freq: Once | INTRAVENOUS | Status: AC
  Administered 2020-03-05: 616 mg via INTRAVENOUS
  Filled 2020-03-05: qty 30.8

## 2020-03-05 MED ORDER — POTASSIUM CHLORIDE 10 MEQ/100ML IV SOLN
10.0000 meq | INTRAVENOUS | Status: AC
  Administered 2020-03-05 (×3): 10 meq via INTRAVENOUS
  Filled 2020-03-05 (×3): qty 100

## 2020-03-05 MED ORDER — ONDANSETRON HCL 4 MG/2ML IJ SOLN: 4.0000 mg | Freq: Four times a day (QID) | INTRAMUSCULAR | Status: DC | PRN

## 2020-03-05 MED ORDER — INSULIN ASPART 100 UNIT/ML ~~LOC~~ SOLN
0.0000 [IU] | SUBCUTANEOUS | Status: DC
  Administered 2020-03-06 (×3): 1 [IU] via SUBCUTANEOUS
  Administered 2020-03-06: 2 [IU] via SUBCUTANEOUS
  Administered 2020-03-06 – 2020-03-07 (×2): 1 [IU] via SUBCUTANEOUS
  Administered 2020-03-07 (×2): 2 [IU] via SUBCUTANEOUS
  Administered 2020-03-07: 1 [IU] via SUBCUTANEOUS
  Administered 2020-03-08: 2 [IU] via SUBCUTANEOUS
  Administered 2020-03-08: 3 [IU] via SUBCUTANEOUS
  Administered 2020-03-08: 2 [IU] via SUBCUTANEOUS
  Administered 2020-03-08: 3 [IU] via SUBCUTANEOUS
  Filled 2020-03-05 (×4): qty 1

## 2020-03-05 MED ORDER — ENOXAPARIN SODIUM 40 MG/0.4ML ~~LOC~~ SOLN
40.0000 mg | SUBCUTANEOUS | Status: DC
  Administered 2020-03-06 – 2020-03-08 (×3): 40 mg via SUBCUTANEOUS
  Filled 2020-03-05 (×3): qty 0.4

## 2020-03-05 MED ORDER — METHYLPREDNISOLONE SODIUM SUCC 125 MG IJ SOLR
125.0000 mg | Freq: Once | INTRAMUSCULAR | Status: AC
  Administered 2020-03-05: 125 mg via INTRAVENOUS
  Filled 2020-03-05: qty 2

## 2020-03-05 MED ORDER — MOMETASONE FURO-FORMOTEROL FUM 200-5 MCG/ACT IN AERO
2.0000 | INHALATION_SPRAY | Freq: Two times a day (BID) | RESPIRATORY_TRACT | Status: DC
  Administered 2020-03-06 – 2020-03-08 (×5): 2 via RESPIRATORY_TRACT
  Filled 2020-03-05 (×2): qty 8.8

## 2020-03-05 MED ORDER — ONDANSETRON HCL 4 MG PO TABS: 4.0000 mg | ORAL_TABLET | Freq: Four times a day (QID) | ORAL | Status: DC | PRN

## 2020-03-05 MED ORDER — SODIUM CHLORIDE 0.9 % IV BOLUS
1000.0000 mL | Freq: Once | INTRAVENOUS | Status: AC
  Administered 2020-03-05: 1000 mL via INTRAVENOUS

## 2020-03-05 MED ORDER — ACETAMINOPHEN 325 MG PO TABS
650.0000 mg | ORAL_TABLET | Freq: Once | ORAL | Status: AC
  Administered 2020-03-05: 650 mg via ORAL
  Filled 2020-03-05: qty 2

## 2020-03-05 MED ORDER — SODIUM CHLORIDE 0.9 % IV SOLN
100.0000 mg | INTRAVENOUS | Status: AC
  Administered 2020-03-05 (×2): 100 mg via INTRAVENOUS
  Filled 2020-03-05 (×2): qty 20

## 2020-03-05 MED ORDER — MAGNESIUM SULFATE 2 GM/50ML IV SOLN
2.0000 g | Freq: Once | INTRAVENOUS | Status: AC
  Administered 2020-03-05: 2 g via INTRAVENOUS
  Filled 2020-03-05: qty 50

## 2020-03-05 MED ORDER — SODIUM CHLORIDE 0.9 % IV SOLN
100.0000 mg | Freq: Every day | INTRAVENOUS | Status: DC
  Administered 2020-03-06 – 2020-03-08 (×3): 100 mg via INTRAVENOUS
  Filled 2020-03-05 (×3): qty 20

## 2020-03-05 MED ORDER — POTASSIUM CHLORIDE 10 MEQ/100ML IV SOLN
10.0000 meq | INTRAVENOUS | Status: AC
  Administered 2020-03-05 (×2): 10 meq via INTRAVENOUS
  Filled 2020-03-05 (×2): qty 100

## 2020-03-05 MED ORDER — ALBUTEROL SULFATE HFA 108 (90 BASE) MCG/ACT IN AERS: 2.0000 | INHALATION_SPRAY | RESPIRATORY_TRACT | Status: DC | PRN

## 2020-03-05 MED ORDER — DEXAMETHASONE 4 MG PO TABS: 6.0000 mg | ORAL_TABLET | ORAL | Status: DC

## 2020-03-05 MED ORDER — IOHEXOL 350 MG/ML SOLN
100.0000 mL | Freq: Once | INTRAVENOUS | Status: AC | PRN
  Administered 2020-03-05: 100 mL via INTRAVENOUS

## 2020-03-05 MED ORDER — SODIUM CHLORIDE 0.9 % IV SOLN
1000.0000 mL | INTRAVENOUS | Status: DC
  Administered 2020-03-05: 1000 mL via INTRAVENOUS

## 2020-03-05 MED ORDER — NALOXONE HCL 0.4 MG/ML IJ SOLN: 0.4000 mg | Freq: Once | INTRAMUSCULAR | Status: DC

## 2020-03-05 MED ORDER — GUAIFENESIN-DM 100-10 MG/5ML PO SYRP: 10.0000 mL | ORAL_SOLUTION | ORAL | Status: DC | PRN

## 2020-03-05 NOTE — ED Notes (Signed)
Patient to CT at this time

## 2020-03-05 NOTE — H&P (Signed)
History and Physical    Mark BillsFreddie L Schroeder ZOX:[id number] DOB: 10/29/1945 DOA: 03/05/2020  [facility]   Patient coming from: Home  I have personally briefly reviewed patient's old medical records [facility]  Chief Complaint: Difficulty breathing  HPI: Mark Schroeder is a 75 y.o. male with medical history significant for COPD, diabetes mellitus with neuropathy, PTSD and depression. Patient was brought to the ED with complaints of difficulty breathing, fever and cough of 4 days duration.  At the time of my evaluation, patient was lethargic and drowsy, able to tell me his name but quickly dropping back to sleep.  History is mostly obtained from chart review.  Patient and his wife had a cold 4 days ago Saturday.  Spouse also reported that patient appeared confused the next day Sunday. Patient has been vaccinated for COVID x2, but did not receive booster dose.  ED Course:.  Initial tachycardia at 118.  Respiratory rate 17-29.  O2 sat 75% on room air, currently on 4 L with sats 93 to 98%.  COVID test positive.  Potassium 2.5.  Magnesium 2.5.  D-dimer 0.78.  Mild elevations in order inflammatory panels.  Lactic acid 0.9.  Chest x-ray without acute abnormality.  Head CT shows stable chronic microvascular changes no acute abnormality.  125 mg Solu-Medrol given.  Hospitalist admit for acute respiratory failure.  Review of Systems: Unable to fully assess due to altered mental status.  Past Medical History:  Diagnosis Date  . COPD (chronic obstructive pulmonary disease) (HCC)   . Depression   . Diabetic peripheral neuropathy (HCC)   . Hyperlipidemia   . Hypertension   . PTSD (post-traumatic stress disorder)   . Type 2 diabetes mellitus (HCC)     Past Surgical History:  Procedure Laterality Date  . 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 Titanium Rod in left leg from knee to ankle Left 03/2011     reports that he has quit smoking.  His smoking use included cigarettes. He has a 8.75 pack-year smoking history. He has never used smokeless tobacco. He reports that he does not drink alcohol and does not use drugs.  No Known Allergies  Family History  Problem Relation Age of Onset  . Hypertension Mother     Prior to Admission medications   Medication Sig Start Date End Date Taking? Authorizing Provider  acetaminophen (TYLENOL) 325 MG tablet Take 2 tablets (650 mg total) by mouth every 6 (six) hours as needed for mild pain (or Fever >/= 101). 04/17/19  Yes Mitch Arquette, Courage, MD  albuterol (PROVENTIL HFA;VENTOLIN HFA) 108 (90 BASE) MCG/ACT inhaler Inhale 2 puffs into the lungs 4 (four) times daily.   Yes Carles ColletWelty-Wolf, Karen, MD  albuterol (PROVENTIL) (2.5 MG/3ML) 0.083% nebulizer solution Take 2.5 mg by nebulization 3 (three) times daily as needed for wheezing or shortness of breath.   Yes [provider]  aspirin EC 81 MG tablet Take 1 tablet (81 mg total) by mouth daily with breakfast. 04/17/19  Yes Araya Roel, Courage, MD  budesonide-formoterol (SYMBICORT) 160-4.5 MCG/ACT inhaler Inhale 2 puffs into the lungs 2 (two) times daily. 04/17/19  Yes Charlyne Robertshaw, Courage, MD  buPROPion (ZYBAN) 150 MG 12 hr tablet Take 150 mg by mouth 2 (two) times daily.   Yes [provider]  carboxymethylcellulose (REFRESH PLUS) 0.5 % SOLN 1 drop 4 (four) times daily as needed (for dry eyes).   Yes [provider]  carvedilol (COREG) 12.5  MG tablet Take 12.5 mg by mouth every 12 (twelve) hours.   Yes [provider]  Cholecalciferol (VITAMIN D PO) Take 1,000 capsules by mouth daily.    Yes [provider]  diclofenac Sodium (VOLTAREN) 1 % GEL Apply 4 g topically 3 (three) times daily as needed (for pain).   Yes [provider]  divalproex (DEPAKOTE ER) 250 MG 24 hr tablet Take 250 mg by mouth daily.   Yes [provider]  doxepin (SINEQUAN) 50 MG capsule Take 100 mg by mouth at bedtime.   Yes  [provider]  famotidine (PEPCID) 40 MG tablet Take 40 mg by mouth 2 (two) times daily.   Yes [provider]  gabapentin (NEURONTIN) 300 MG capsule Take 600 mg by mouth in the morning and at bedtime.    Yes [provider]  lactobacillus acidophilus (BACID) TABS tablet Take 1 tablet by mouth 2 (two) times daily.   Yes [provider]  loratadine (CLARITIN) 10 MG tablet Take 10 mg by mouth daily.   Yes [provider]  Melatonin 5 MG TABS Take 10 mg by mouth at bedtime.   Yes [provider]  potassium chloride SA (KLOR-CON) 20 MEQ tablet Take 1 tablet (20 mEq total) by mouth 2 (two) times daily. 09/21/19  Yes Dione Booze, MD  tiotropium (SPIRIVA) 18 MCG inhalation capsule Place 1 capsule (18 mcg total) into inhaler and inhale daily. 04/17/19  Yes Baer Hinton, Courage, MD  Dexlansoprazole 30 MG capsule Take 30 mg by mouth daily. Patient not taking: No sig reported    [provider]  sildenafil (VIAGRA) 100 MG tablet Take 100 mg by mouth daily as needed for erectile dysfunction. Do not take within 6 hours of Terazosin, Prazosin, or Doxazosin.    [provider]  tamsulosin (FLOMAX) 0.4 MG CAPS capsule Take 0.4 mg by mouth daily.    [provider]  triamcinolone (KENALOG) 0.025 % cream Apply 1 application topically 2 (two) times daily.    [provider]    Physical Exam: Vitals:   03/05/20 1700 03/05/20 1730 03/05/20 1800 03/05/20 1830  BP: (!) 107/57 (!) 159/75 111/66 115/60  Pulse: 89 90 89 84  Resp: (!) 21 (!) 25 (!) 22 20  Temp:      TempSrc:      SpO2: 99% 100% 94% 93%    Constitutional: Lethargic, drowsy, Vitals:   03/05/20 1700 03/05/20 1730 03/05/20 1800 03/05/20 1830  BP: (!) 107/57 (!) 159/75 111/66 115/60  Pulse: 89 90 89 84  Resp: (!) 21 (!) 25 (!) 22 20  Temp:      TempSrc:      SpO2: 99% 100% 94% 93%   Eyes: PERRL, lids and conjunctivae normal ENMT: Mucous membranes are moist.   Neck: normal, supple, no masses, no thyromegaly Respiratory: O2 sats 97% during my evaluation, normal respiratory effort. No accessory muscle use.  Cardiovascular: Regular rate and rhythm, No extremity edema- left LE. 2+ pedal pulses. No carotid bruits.  Abdomen: no tenderness, no masses palpated. No hepatosplenomegaly. Bowel sounds positive.  Musculoskeletal: no clubbing / cyanosis.  Right BKA. Skin: no rashes, lesions, ulcers. No induration Neurologic: No apparent cranial abnormality, limited by patient's transient altered mental status. Psychiatric: Lethargic, drowsy.  Initially dropping off to sleep, requiring verbal and tactile stimulation to keep him awake but on reexamination ~ 1 hr later, patient sleeping but easily arousable to voice, answers simple questions appropriately.  Labs on Admission: I have personally  reviewed following labs and imaging studies  CBC: Recent Labs  Lab 03/05/20 1445  WBC 5.9  NEUTROABS 4.2  HGB 14.2  HCT 45.3  MCV 91.1  PLT 269   Basic Metabolic Panel: Recent Labs  Lab 03/05/20 1445 03/05/20 1702  NA 139  --   K 2.5*  --   CL 91*  --   CO2 34*  --   GLUCOSE 109*  --   BUN 15  --   CREATININE 1.15  --   CALCIUM 8.9  --   MG  --  2.5*   Liver Function Tests: Recent Labs  Lab 03/05/20 1445  AST 50*  ALT 23  ALKPHOS 40  BILITOT 1.2  PROT 8.3*  ALBUMIN 3.7   Lipid Profile: Recent Labs    03/05/20 1445  TRIG 187*   Anemia Panel: Recent Labs    03/05/20 1445  FERRITIN 344*    Radiological Exams on Admission: CT Head Wo Contrast  Result Date: 03/05/2020 CLINICAL DATA:  Delirium EXAM: CT HEAD WITHOUT CONTRAST TECHNIQUE: Contiguous axial images were obtained from the base of the skull through the vertex without intravenous contrast. COMPARISON:  09/21/2019 FINDINGS: Brain: There is no acute intracranial hemorrhage, mass effect, or edema. Gray-white differentiation is preserved. There is no extra-axial fluid collection.  Prominence of the ventricles and sulci reflects stable parenchymal volume loss. Patchy hypoattenuation in the supratentorial white matter is nonspecific but probably reflects stable chronic microvascular ischemic changes. Vascular: There is atherosclerotic calcification at the skull base. Skull: Calvarium is unremarkable. Sinuses/Orbits: Paranasal sinus mucosal thickening with opacification of the right sphenoid. Orbits are unremarkable. Other: Mastoid air cells are clear. IMPRESSION: No acute intracranial abnormality. Stable chronic microvascular ischemic changes. Nonspecific right sphenoid sinus opacification, which could reflect acute sinusitis in the appropriate setting. Electronically Signed   By: Guadlupe Spanish M.D.   On: 03/05/2020 18:24   DG Chest Port 1 View  Result Date: 03/05/2020 CLINICAL DATA:  Shortness of breath, fever, anorexia for 4 days, cough, decreased oxygen saturation of 74% EXAM: PORTABLE CHEST 1 VIEW COMPARISON:  Portable exam 1506 hours compared to 09/21/2019 FINDINGS: Normal heart size, mediastinal contours, and pulmonary vascularity. Lungs clear. Questionable nodular density LEFT lung versus prominent LEFT sixth rib costochondral junction or nipple shadow. No pleural effusion or pneumothorax. Bones demineralized. IMPRESSION: Question LEFT nipple shadow versus nodular density are prominent LEFT sixth costochondral junction; follow-up upright PA chest radiograph with nipple markers recommended to exclude pulmonary nodule. Electronically Signed   By: Ulyses Southward M.D.   On: 03/05/2020 15:13    EKG: Independently reviewed.  Sinus tachycardia rate 116, QTc 484.  No significant change from prior.  Assessment/Plan Principal Problem:   Pneumonia due to COVID-19 virus Active Problems:   Type 2 diabetes mellitus (HCC)   Hypertension   Depression   PTSD (post-traumatic stress disorder)   Acute respiratory failure with hypoxia (HCC)   Hx of BKA, right (HCC)   COVID-19 here with  acute hypoxic respiratory failure-febrile, dyspnea, cough.  O2 sat 75% on room air, currently on 4 L with sats 93 to 98%.  Chest x-ray unremarkable, subsequent CTA chest negative for PE, findings consistent with COVID-pneumonia.  Vaccinated for COVID x2, did not receive booster dose. -Obtain a trend inflammatory markers - Remdesivir, Actemra -IV Solu-Medrol 125 given, continue IV dexamethasone 6 mg daily in a.m. -Remain n.p.o. -COVID-19 admission protocol -Supplemental O2, incentive spirometry, flutter valve. supplement  -CMP, CBC daily  Metabolic encephalopathy-  Transient, appears to  wax and wane, possibly due to acute viral infection.  Head CT without acute abnormality.  Obtained ABG -pH 7.36, mildly elevated PCO2 58 would not explain the encephalopathy.  History of PTSD and depression. -Hold home psychoactive medications bupropion , gabapentin, -UDS, blood alcohol level  Hypokalemia-potassium 2.5.  Magnesium 2.5.  Spouse reports one episode of loose stool.  No vomiting.  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 - Replete K  Depression, PTSD-  -Hold gabapentin and Depakote while NPO pending improvement in mental status.,  And med reconciliation  HTN-stable. -Resume carvedilol in the morning  Right BKA.  DVT prophylaxis: Lovenox Code Status: FULL code Family Communication: Talked to spouse Rhunette Croft, who also has COVID symptoms, and quarantining at home.  All questions answered, plan of care explained. Disposition Plan: > 2 days. Consults called: None. Admission status: inpt, step down. I certify that at the point of admission it is my clinical judgment that the patient will require inpatient hospital care spanning beyond 2 midnights from the point of admission due to high intensity of service, high risk for further deterioration and high frequency of surveillance required.    Onnie Boer MD [facility]  03/05/2020, 9:31 PM

## 2020-03-05 NOTE — ED Notes (Addendum)
Upon pt returning from CT, this RN was informed that patient was more lethargic than previously. Upon assessment, pt was responding to painful stimuli/sternal rubs, but was very lethargic. BGL was checked, which is 119. Dr. Mariea Clonts was made aware. Awaiting further orders.

## 2020-03-05 NOTE — ED Notes (Signed)
Pt being transported to CT at this time. 

## 2020-03-05 NOTE — ED Provider Notes (Signed)
This patient is a critically ill-appearing 75 year old male, history of amputation of one of his legs, has presented with increasing oxygen requirement 74% in triage requiring 4 L by nasal cannula.  He has had increasing symptoms recently, he is COVID-positive today, he has no leukocytosis, no anemia and his metabolic panel is significant only for a potassium of 2.5.  He does have a slightly prolonged QT on his EKG.  He has been giving potassium, oxygen, he has a fever, he is tachypneic and borderline tachycardic.  He will need to be admitted to the hospital for the severe hypoxic acute respiratory failure.  Agree with CC services as documented by APP  Mark Schroeder was evaluated in Emergency Department on 03/05/2020 for the symptoms described in the history of present illness. He was evaluated in the context of the global COVID-19 pandemic, which necessitated consideration that the patient might be at risk for infection with the SARS-CoV-2 virus that causes COVID-19. Institutional protocols and algorithms that pertain to the evaluation of patients at risk for COVID-19 are in a state of rapid change based on information released by regulatory bodies including the CDC and federal and state organizations. These policies and algorithms were followed during the patient's care in the ED.  Medical screening examination/treatment/procedure(s) were conducted as a shared visit with non-physician practitioner(s) and myself.  I personally evaluated the patient during the encounter.  Clinical Impression:   Final diagnoses:  COVID  Acute respiratory failure with hypoxia (HCC)  Hypokalemia          Mark Hong, MD 03/05/20 2153

## 2020-03-05 NOTE — ED Triage Notes (Signed)
Pt brought to ED by family with complaints of fever, no appetite x 4 days, cough. Wife states pt seems more altered on Sunday night.  Pt O2 sats in triage 74% placed on 4L sats increased to 91%

## 2020-03-05 NOTE — ED Provider Notes (Signed)
[facility] EMERGENCY DEPARTMENT Provider Note   CSN: [id number] Arrival date & time: 03/05/20  1319    History Chief Complaint  Patient presents with  . Cough    Alena BillsFreddie L Diefenderfer is a 75 y.o. male with history significant for type 2 diabetes, hypertension, COPD who presents for evaluation of fever and shortness of breath.  Level 5 caveat-acuity of condition  Collateral from patients wife Balinda QuailsMildred Well at [phone]  Per patient's wife patient is vaccinated however not boosted for COVID.  Patient as well as his wife had had a "cold" since Saturday.  Has been having chills, fever at home.  He has had nonproductive cough.  He has had no appetite and has not really been eating or drinking over the last 4 days.  Wife states patient seemed confused on Sunday.  States woke up the next day and was "fine."   Discussed with wife CODE STATUS.  States she is not sure and will check his will.  She knows he would not want to be on a ventilator for "long-term" however she is "open" for short time if needed.  She states she will check on his living wil at home.  HPI     Past Medical History:  Diagnosis Date  . COPD (chronic obstructive pulmonary disease) (HCC)   . Depression   . Diabetic peripheral neuropathy (HCC)   . Hyperlipidemia   . Hypertension   . PTSD (post-traumatic stress disorder)   . Type 2 diabetes mellitus [facility](HCC)     Patient Active Problem List   Diagnosis Date Noted  . Acute respiratory failure with hypoxia (HCC)   . Sepsis due to undetermined organism (HCC) 08/15/2019  . Hyperbilirubinemia 04/13/2019  . Hypertension   . Depression   . PTSD (post-traumatic stress disorder)   . Diabetic peripheral neuropathy (HCC)   . Hyperlipidemia   . CAP (community acquired pneumonia) 08/15/2014  . COPD with exacerbation (HCC) 08/15/2014  . Hypokalemia 08/15/2014  . Acute kidney injury (HCC) 08/15/2014  . Normocytic anemia 08/15/2014  . Type 2 diabetes mellitus (HCC)  08/15/2014    Past Surgical History:  Procedure Laterality Date  . 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 Titanium Rod in left leg from knee to ankle Left 03/2011       Family History  Problem Relation Age of Onset  . Hypertension Mother     Social History   Tobacco Use  . Smoking status: Former Smoker    Packs/day: 0.25    Years: 35.00    Pack years: 8.75    Types: Cigarettes  . Smokeless tobacco: Never Used  Substance Use Topics  . Alcohol use: No  . Drug use: No    Home Medications Prior to Admission medications   Medication Sig Start Date End Date Taking? Authorizing Provider  acetaminophen (TYLENOL) 325 MG tablet Take 2 tablets (650 mg total) by mouth every 6 (six) hours as needed for mild pain (or Fever >/= 101). 04/17/19   Emokpae, Courage, MD  albuterol (PROVENTIL HFA;VENTOLIN HFA) 108 (90 BASE) MCG/ACT inhaler Inhale 2 puffs into the lungs 4 (four) times daily.    Carles ColletWelty-Wolf, Karen, MD  albuterol (PROVENTIL) (2.5 MG/3ML) 0.083% nebulizer solution Take 2.5 mg by nebulization 3 (three) times daily as needed for wheezing or shortness of breath.    [provider]  aspirin EC 81 MG tablet Take 1 tablet (81 mg total) by mouth daily with breakfast. 04/17/19  Shon HaleEmokpae, Courage, MD  budesonide-formoterol (SYMBICORT) 160-4.5 MCG/ACT inhaler Inhale 2 puffs into the lungs 2 (two) times daily. 04/17/19   Shon HaleEmokpae, Courage, MD  buPROPion (ZYBAN) 150 MG 12 hr tablet Take 150 mg by mouth 2 (two) times daily.    [provider]  carboxymethylcellulose (REFRESH PLUS) 0.5 % SOLN 1 drop 4 (four) times daily as needed (for dry eyes).    [provider]  carvedilol (COREG) 12.5 MG tablet Take 12.5 mg by mouth every 12 (twelve) hours.    [provider]  Cholecalciferol (VITAMIN D PO) Take 1,000 capsules by mouth daily.     [provider]  Dexlansoprazole 30 MG capsule Take 30 mg by mouth daily.    [provider]  diclofenac Sodium (VOLTAREN) 1 % GEL Apply 4 g topically 3 (three) times daily as needed (for pain).    [provider]  divalproex (DEPAKOTE ER) 250 MG 24 hr tablet Take 250 mg by mouth daily.    [provider]  doxepin (SINEQUAN) 50 MG capsule Take 100 mg by mouth at bedtime.    [provider]  famotidine (PEPCID) 40 MG tablet Take 40 mg by mouth 2 (two) times daily.    [provider]  gabapentin (NEURONTIN) 300 MG capsule Take 600 mg by mouth in the morning and at bedtime.     [provider]  lactobacillus acidophilus (BACID) TABS tablet Take 1 tablet by mouth 2 (two) times daily.    [provider]  loratadine (CLARITIN) 10 MG tablet Take 10 mg by mouth daily.    [provider]  Melatonin 5 MG TABS Take 10 mg by mouth at bedtime.    [provider]  potassium chloride SA (KLOR-CON) 20 MEQ tablet Take 1 tablet (20 mEq total) by mouth 2 (two) times daily. 09/21/19   Dione BoozeGlick, David, MD  sildenafil (VIAGRA) 100 MG tablet Take 100 mg by mouth daily as needed for erectile dysfunction. Do not take within 6 hours of Terazosin, Prazosin, or Doxazosin.    [provider]  tamsulosin (FLOMAX) 0.4 MG CAPS capsule Take 0.4 mg by mouth daily.    [provider]  tiotropium (SPIRIVA) 18 MCG inhalation capsule Place 1 capsule (18 mcg total) into inhaler and inhale daily. 04/17/19   Shon HaleEmokpae, Courage, MD  triamcinolone (KENALOG) 0.025 % cream Apply 1 application topically 2 (two) times daily.    [provider]    Allergies    Patient has no known allergies.  Review of Systems   Review of Systems  Unable to perform ROS: Severe respiratory distress    Physical Exam Updated Vital Signs BP 136/78   Pulse (!) 110   Temp (!) 101.3 F (38.5 C) (Oral)   Resp (!) 29   SpO2 96%   Physical Exam Vitals and nursing note reviewed.  Constitutional:      General: He is in acute distress.      Appearance: He is well-developed and well-nourished. He is ill-appearing. He is not diaphoretic.  HENT:     Head: Normocephalic and atraumatic.     Mouth/Throat:     Mouth: Mucous membranes are dry.  Eyes:     Pupils: Pupils are equal, round, and reactive to light.  Cardiovascular:     Rate and Rhythm: Regular rhythm. Tachycardia present.  Pulmonary:     Effort: Respiratory distress present.     Comments: Decreased breath sounds.  Tachypnea, tachycardia, hypoxia. Abdominal:  General: There is no distension.     Palpations: Abdomen is soft.     Tenderness: There is no abdominal tenderness. There is no right CVA tenderness, left CVA tenderness, guarding or rebound.  Musculoskeletal:        General: Normal range of motion.     Cervical back: Normal range of motion and neck supple.     Comments: Prosthetic to RLE, left lower extremity compartments soft. Able to lift legs off bed.  Skin:    General: Skin is warm and dry.     Capillary Refill: Capillary refill takes less than 2 seconds.  Neurological:     Mental Status: He is alert. Mental status is at baseline.  Psychiatric:        Mood and Affect: Mood and affect normal.    ED Results / Procedures / Treatments   Labs (all labs ordered are listed, but only abnormal results are displayed) Labs Reviewed  COMPREHENSIVE METABOLIC PANEL - Abnormal; Notable for the following components:      Result Value   Potassium 2.5 (*)    Chloride 91 (*)    CO2 34 (*)    Glucose, Bld 109 (*)    Total Protein 8.3 (*)    AST 50 (*)    All other components within normal limits  D-DIMER, QUANTITATIVE (NOT AT [facility]) - Abnormal; Notable for the following components:   D-Dimer, Quant 0.78 (*)    All other components within normal limits  LACTATE DEHYDROGENASE - Abnormal; Notable for the following components:   LDH 471 (*)    All other components within normal limits  FERRITIN - Abnormal; Notable for the following components:   Ferritin 344 (*)     All other components within normal limits  TRIGLYCERIDES - Abnormal; Notable for the following components:   Triglycerides 187 (*)    All other components within normal limits  FIBRINOGEN - Abnormal; Notable for the following components:   Fibrinogen 507 (*)    All other components within normal limits  C-REACTIVE PROTEIN - Abnormal; Notable for the following components:   CRP 11.0 (*)    All other components within normal limits  TROPONIN I (HIGH SENSITIVITY) - Abnormal; Notable for the following components:   Troponin I (High Sensitivity) 20 (*)    All other components within normal limits  CULTURE, BLOOD (ROUTINE X 2)  CULTURE, BLOOD (ROUTINE X 2)  LACTIC ACID, PLASMA  CBC WITH DIFFERENTIAL/PLATELET  LACTIC ACID, PLASMA  PROCALCITONIN  MAGNESIUM  POC SARS CORONAVIRUS 2 AG -  ED    EKG None  Radiology DG Chest Port 1 View  Result Date: 03/05/2020 CLINICAL DATA:  Shortness of breath, fever, anorexia for 4 days, cough, decreased oxygen saturation of 74% EXAM: PORTABLE CHEST 1 VIEW COMPARISON:  Portable exam 1506 hours compared to 09/21/2019 FINDINGS: Normal heart size, mediastinal contours, and pulmonary vascularity. Lungs clear. Questionable nodular density LEFT lung versus prominent LEFT sixth rib costochondral junction or nipple shadow. No pleural effusion or pneumothorax. Bones demineralized. IMPRESSION: Question LEFT nipple shadow versus nodular density are prominent LEFT sixth costochondral junction; follow-up upright PA chest radiograph with nipple markers recommended to exclude pulmonary nodule. Electronically Signed   By: Ulyses Southward M.D.   On: 03/05/2020 15:13    Procedures .Critical Care Performed by: Linwood Dibbles, PA-C Authorized by: Linwood Dibbles, PA-C   Critical care provider statement:    Critical care time (minutes):  45   Critical care was necessary to treat  or prevent imminent or life-threatening deterioration of the following conditions:   Respiratory failure and metabolic crisis   Critical care was time spent personally by me on the following activities:  Discussions with consultants, evaluation of patient's response to treatment, examination of patient, ordering and performing treatments and interventions, ordering and review of laboratory studies, ordering and review of radiographic studies, pulse oximetry, re-evaluation of patient's condition, obtaining history from patient or surrogate and review of old charts   (including critical care time)  Medications Ordered in ED Medications  0.9 %  sodium chloride infusion (1,000 mLs Intravenous New Bag/Given 03/05/20 1515)  potassium chloride 10 mEq in 100 mL IVPB (has no administration in time range)  magnesium sulfate IVPB 2 g 50 mL (has no administration in time range)  sodium chloride 0.9 % bolus 1,000 mL (1,000 mLs Intravenous New Bag/Given 03/05/20 1458)  acetaminophen (TYLENOL) tablet 650 mg (650 mg Oral Given 03/05/20 1458)  methylPREDNISolone sodium succinate (SOLU-MEDROL) 125 mg/2 mL injection 125 mg (125 mg Intravenous Given 03/05/20 1458)    ED Course  I have reviewed the triage vital signs and the nursing notes.  Pertinent labs & imaging results that were available during my care of the patient were reviewed by me and considered in my medical decision making (see chart for details).  75 year old presents for an respiratory distress.  Noted to be febrile, tachycardic, tachypneic and hypoxic on arrival.  Per wife has had a "few days" of cold.  He is vaccine against COVID however not boosted.  Patient placed on 6 L via nasal cannula.  He has decreased air movement bilaterally.  Apparently wife states he was confused on Sunday which resolved.  No recent head trauma per wife.  Abdomen soft, nontender.  Rapid COVID testing POSITIVE.  Called on-call include sepsis due to known COVID.  Labs, imaging and reassess.  Labs and imaging personally reviewed and interpreted:  CBC without  leukocytosis D-dimer 0.70 Lactic acid 1.8 CMP with hypokalemia 2.5, chloride 91, CO2 34, glucose 109, noticed electrolyte, renal abnormality we will supplement with IV potassium as well as magnesium Dg chest with nipple shadow versus nodular density  CT head without acute changes EKG without ischemic changes  Patient reassessed.  Significant improvement in work of breathing with steroids.  Able to titrate down to 4 L on nasal cannula.  Patient appears comfortable.  Discussed positive COVID status and admission.  Patient agreeable to this.  Patient critically ill with hypoxic respiratory failure likely due to his COVID-pneumonia as well as electrolyte abnormalities.  Will be admitted for further management.  CONSULT with Dr. Mariea Clonts with [facility] who will evaluate patient for admission.  Patient seen and evaluated by attending Dr. Hyacinth Meeker who agrees with above treatment, plan and disposition.  The patient appears reasonably stabilized for admission considering the current resources, flow, and capabilities available in the ED at this time, and I doubt any other [facility] requiring further screening and/or treatment in the ED prior to admission.    MDM Rules/Calculators/A&P                         VERN GUERETTE was evaluated in Emergency Department on 03/05/2020 for the symptoms described in the history of present illness. He was evaluated in the context of the global COVID-19 pandemic, which necessitated consideration that the patient might be at risk for infection with the SARS-CoV-2 virus that causes COVID-19. Institutional protocols and algorithms that pertain to the evaluation  of patients at risk for COVID-19 are in a state of rapid change based on information released by regulatory bodies including the CDC and federal and state organizations. These policies and algorithms were followed during the patient's care in the ED. Final Clinical Impression(s) / ED Diagnoses Final diagnoses:  COVID  Acute  respiratory failure with hypoxia (HCC)  Hypokalemia    Rx / DC Orders ED Discharge Orders    None       Amoura Ransier A, PA-C 03/05/20 2030    Eber Hong, MD 03/05/20 2153

## 2020-03-05 NOTE — ED Notes (Addendum)
Upon entering room, pt was alert. When questioned, he was A/O x4. Pt denied any pain at this time. Dr. Mariea Clonts was made aware of this positive change in status.

## 2020-03-06 DIAGNOSIS — I10 Essential (primary) hypertension: Secondary | ICD-10-CM | POA: Diagnosis not present

## 2020-03-06 DIAGNOSIS — J1282 Pneumonia due to coronavirus disease 2019: Secondary | ICD-10-CM

## 2020-03-06 DIAGNOSIS — J441 Chronic obstructive pulmonary disease with (acute) exacerbation: Secondary | ICD-10-CM | POA: Diagnosis not present

## 2020-03-06 DIAGNOSIS — J9601 Acute respiratory failure with hypoxia: Secondary | ICD-10-CM | POA: Diagnosis not present

## 2020-03-06 DIAGNOSIS — U071 COVID-19: Principal | ICD-10-CM

## 2020-03-06 DIAGNOSIS — E876 Hypokalemia: Secondary | ICD-10-CM

## 2020-03-06 LAB — C-REACTIVE PROTEIN: CRP: 9 mg/dL — ABNORMAL HIGH (ref <1.0)

## 2020-03-06 LAB — D-DIMER, QUANTITATIVE: D-Dimer, Quant: 1.03 ug/mL-FEU — ABNORMAL HIGH (ref 0.00–0.50)

## 2020-03-06 LAB — VITAMIN B12: Vitamin B-12: 719 pg/mL (ref 180–914)

## 2020-03-06 LAB — CBC WITH DIFFERENTIAL/PLATELET
Abs Immature Granulocytes: 0.03 10*3/uL (ref 0.00–0.07)
Basophils Absolute: 0 10*3/uL (ref 0.0–0.1)
Basophils Relative: 0 %
Eosinophils Absolute: 0 10*3/uL (ref 0.0–0.5)
Eosinophils Relative: 0 %
HCT: 41.3 % (ref 39.0–52.0)
Hemoglobin: 12.8 g/dL — ABNORMAL LOW (ref 13.0–17.0)
Immature Granulocytes: 1 %
Lymphocytes Relative: 20 %
Lymphs Abs: 0.6 10*3/uL — ABNORMAL LOW (ref 0.7–4.0)
MCH: 29 pg (ref 26.0–34.0)
MCHC: 31 g/dL (ref 30.0–36.0)
MCV: 93.7 fL (ref 80.0–100.0)
Monocytes Absolute: 0.1 10*3/uL (ref 0.1–1.0)
Monocytes Relative: 4 %
Neutro Abs: 2.1 10*3/uL (ref 1.7–7.7)
Neutrophils Relative %: 75 %
Platelets: 221 10*3/uL (ref 150–400)
RBC: 4.41 MIL/uL (ref 4.22–5.81)
RDW: 13.4 % (ref 11.5–15.5)
WBC: 2.9 10*3/uL — ABNORMAL LOW (ref 4.0–10.5)
nRBC: 0 % (ref 0.0–0.2)

## 2020-03-06 LAB — T4, FREE: Free T4: 0.98 ng/dL (ref 0.61–1.12)

## 2020-03-06 LAB — COMPREHENSIVE METABOLIC PANEL
ALT: 18 U/L (ref 0–44)
AST: 36 U/L (ref 15–41)
Albumin: 2.7 g/dL — ABNORMAL LOW (ref 3.5–5.0)
Alkaline Phosphatase: 28 U/L — ABNORMAL LOW (ref 38–126)
Anion gap: 10 (ref 5–15)
BUN: 16 mg/dL (ref 8–23)
CO2: 31 mmol/L (ref 22–32)
Calcium: 7.8 mg/dL — ABNORMAL LOW (ref 8.9–10.3)
Chloride: 100 mmol/L (ref 98–111)
Creatinine, Ser: 0.76 mg/dL (ref 0.61–1.24)
GFR, Estimated: >60 mL/min (ref >60)
Glucose, Bld: 160 mg/dL — ABNORMAL HIGH (ref 70–99)
Potassium: 3 mmol/L — ABNORMAL LOW (ref 3.5–5.1)
Sodium: 141 mmol/L (ref 135–145)
Total Bilirubin: 1 mg/dL (ref 0.3–1.2)
Total Protein: 6.3 g/dL — ABNORMAL LOW (ref 6.5–8.1)

## 2020-03-06 LAB — URINALYSIS, COMPLETE (UACMP) WITH MICROSCOPIC
Bacteria, UA: NONE SEEN
Bilirubin Urine: NEGATIVE
Glucose, UA: NEGATIVE mg/dL
Ketones, ur: 5 mg/dL — AB
Leukocytes,Ua: NEGATIVE
Nitrite: NEGATIVE
Protein, ur: 100 mg/dL — AB
Specific Gravity, Urine: 1.034 — ABNORMAL HIGH (ref 1.005–1.030)
pH: 6 (ref 5.0–8.0)

## 2020-03-06 LAB — GLUCOSE, CAPILLARY
Glucose-Capillary: 150 mg/dL — ABNORMAL HIGH (ref 70–99)
Glucose-Capillary: 161 mg/dL — ABNORMAL HIGH (ref 70–99)

## 2020-03-06 LAB — CBG MONITORING, ED
Glucose-Capillary: 123 mg/dL — ABNORMAL HIGH (ref 70–99)
Glucose-Capillary: 132 mg/dL — ABNORMAL HIGH (ref 70–99)
Glucose-Capillary: 133 mg/dL — ABNORMAL HIGH (ref 70–99)
Glucose-Capillary: 142 mg/dL — ABNORMAL HIGH (ref 70–99)
Glucose-Capillary: 175 mg/dL — ABNORMAL HIGH (ref 70–99)

## 2020-03-06 LAB — HEMOGLOBIN A1C
Hgb A1c MFr Bld: 6.7 % — ABNORMAL HIGH (ref 4.8–5.6)
Mean Plasma Glucose: 145.59 mg/dL

## 2020-03-06 LAB — MAGNESIUM: Magnesium: 2.1 mg/dL (ref 1.7–2.4)

## 2020-03-06 LAB — PHOSPHORUS: Phosphorus: 2.4 mg/dL — ABNORMAL LOW (ref 2.5–4.6)

## 2020-03-06 LAB — TSH: TSH: 0.05 u[IU]/mL — ABNORMAL LOW (ref 0.350–4.500)

## 2020-03-06 LAB — FOLATE: Folate: 19.5 ng/mL (ref >5.9)

## 2020-03-06 LAB — FERRITIN: Ferritin: 282 ng/mL (ref 24–336)

## 2020-03-06 MED ORDER — METHYLPREDNISOLONE SODIUM SUCC 125 MG IJ SOLR: 60.0000 mg | Freq: Two times a day (BID) | INTRAMUSCULAR | Status: DC

## 2020-03-06 MED ORDER — METHYLPREDNISOLONE SODIUM SUCC 125 MG IJ SOLR
80.0000 mg | Freq: Two times a day (BID) | INTRAMUSCULAR | Status: DC
  Administered 2020-03-06 – 2020-03-08 (×5): 80 mg via INTRAVENOUS
  Filled 2020-03-06 (×5): qty 2

## 2020-03-06 MED ORDER — CARVEDILOL 12.5 MG PO TABS
12.5000 mg | ORAL_TABLET | Freq: Two times a day (BID) | ORAL | Status: DC
  Administered 2020-03-06 – 2020-03-07 (×3): 12.5 mg via ORAL
  Filled 2020-03-06 (×3): qty 1

## 2020-03-06 MED ORDER — IPRATROPIUM-ALBUTEROL 20-100 MCG/ACT IN AERS
2.0000 | INHALATION_SPRAY | Freq: Four times a day (QID) | RESPIRATORY_TRACT | Status: DC
  Administered 2020-03-06 (×3): 2 via RESPIRATORY_TRACT

## 2020-03-06 MED ORDER — HYDRALAZINE HCL 25 MG PO TABS: 50.0000 mg | ORAL_TABLET | Freq: Four times a day (QID) | ORAL | Status: DC | PRN

## 2020-03-06 MED ORDER — IPRATROPIUM-ALBUTEROL 20-100 MCG/ACT IN AERS
INHALATION_SPRAY | RESPIRATORY_TRACT | Status: AC
  Filled 2020-03-06: qty 4

## 2020-03-06 MED ORDER — K PHOS MONO-SOD PHOS DI & MONO 155-852-130 MG PO TABS
500.0000 mg | ORAL_TABLET | Freq: Two times a day (BID) | ORAL | Status: DC
  Administered 2020-03-06 – 2020-03-08 (×5): 500 mg via ORAL
  Filled 2020-03-06 (×6): qty 2

## 2020-03-06 MED ORDER — IPRATROPIUM-ALBUTEROL 20-100 MCG/ACT IN AERS
2.0000 | INHALATION_SPRAY | Freq: Four times a day (QID) | RESPIRATORY_TRACT | Status: DC
  Administered 2020-03-07 (×2): 2 via RESPIRATORY_TRACT

## 2020-03-06 MED ORDER — METHYLPREDNISOLONE SODIUM SUCC 125 MG IJ SOLR: 80.0000 mg | Freq: Two times a day (BID) | INTRAMUSCULAR | Status: DC

## 2020-03-06 NOTE — Progress Notes (Addendum)
PROGRESS NOTE  Mark Schroeder RKY:[id number] DOB: 26-Sep-1945 DOA: 03/05/2020 PCP: [facility]  Brief History:  75 year old male with history of COPD, diabetes mellitus type 2, PTSD, and peripheral neuropathy presenting with 4-day history of shortness of breath, fevers, coughing.  He quit smoking about 10 to 15 years ago.  He denied any chest pain, headache hemoptysis, nausea, vomiting, but did endorse some loose stools over the past 3 to 4 days.  He denies any hematochezia, melena, dysuria, hematuria. In addition, the patient is complaining of some generalized weakness. In the emergency department, the patient was febrile to 101.3 F.  He was hemodynamically stable with oxygen saturation of 75% on room air.  He was placed on 4 L with saturation 96-97%.  The patient has been vaccinated for COVID-19, but had not received his booster.  CTA chest was negative for PE but showed scattered groundglass opacities bilateral.  BMP showed a potassium of 2.5 and serum creatinine 1.15.  LFTs were unremarkable.  WBC 5.9, hemoglobin 14.2, platelets 269,000.  The patient was started on remdesivir and IV steroids.  He also received a dose of Actemra in ED.  Assessment/Plan: Acute respiratory failure with hypoxia secondary COVID-19 -Presented with oxygen saturation 75% on room air -Currently stable on 4 L -CRP 11.0>> -D-dimer 0.78>> 1.03>> -Ferritin 344 -PCT<0.10 -Continue remdesivir and IV steroids -Vitamin C and zinc -Prone positioning as much as tolerated -03/05/2020 CTA chest--negative PE, scattered groundglass opacities bilateral, biapical scarring, retained mucus in the left mainstem and distal mucous plugging in the LLs  COPD exacerbation -Patient still has some mild wheezing on exam -03/05/2020 ABG 7.3 6/58/90/30 on 4 L -Continue IV steroids -Start Combivent  Acute metabolic encephalopathy -The patient was initially lethargic in the emergency department -03/06/2020--A&O x  4 -Obtain UA and urine culture -Check B12 -Folic acid -TSH -Holding gabapentin and sinequan temporarily  DM2 -Does not appear the patient was on any agents in the outpatient setting -NovoLog sliding scale -Hemoglobin A1c  Essential hypertension -Restart carvedilol  Hypokalemia -Replete -Magnesium 2.1  Hypophosphatemia -Start K-Phos  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        Family Communication:  no Family at bedside  Consultants:none    Code Status:  FULL  DVT Prophylaxis:  S Horine Lovenox   Procedures: As Listed in Progress Note Above  Antibiotics: None          Subjective: Patient states that his breathing is improving but he remains short of breath.  He has a nonproductive cough.  He denies any nausea, vomiting, diarrhea.  He did have some loose stools prior to admission.  He denies any abdominal pain, chest pain, headache.  Objective: Vitals:   03/06/20 0330 03/06/20 0430 03/06/20 0530 03/06/20 0630  BP: (!) 156/96 (!) 158/67 (!) 157/89 (!) 180/85  Pulse: 78 77 72 73  Resp: 17 19 20 20   Temp:      TempSrc:  SpO2: 97% 92% 97% 100%  Weight:      Height:        Intake/Output Summary (Last 24 hours) at 03/06/2020 0717 Last data filed at 03/06/2020 0451 Gross per 24 hour  Intake 1144 ml  Output 400 ml  Net 744 ml   Weight change:  Exam:   General:  Pt is alert, follows commands appropriately, not in acute distress  HEENT: No icterus, No thrush, No neck mass, [place]/AT  Cardiovascular: RRR, S1/S2, no rubs, no gallops  Respiratory: Bilateral scattered rales.  Bibasilar wheezing.  Abdomen: Soft/+BS, non tender, non distended,  no guarding  Extremities: No edema, No lymphangitis, No petechiae, No rashes, no synovitis   Data Reviewed: I have personally reviewed following labs and imaging studies Basic Metabolic Panel: Recent Labs  Lab 03/05/20 1445 03/05/20 1702 03/06/20 0538  NA 139  --  141  K 2.5*  --  3.0*  CL 91*  --  100  CO2 34*  --  31  GLUCOSE 109*  --  160*  BUN 15  --  16  CREATININE 1.15  --  0.76  CALCIUM 8.9  --  7.8*  MG  --  2.5* 2.1  PHOS  --   --  2.4*   Liver Function Tests: Recent Labs  Lab 03/05/20 1445 03/06/20 0538  AST 50* 36  ALT 23 18  ALKPHOS 40 28*  BILITOT 1.2 1.0  PROT 8.3* 6.3*  ALBUMIN 3.7 2.7*   No results for input(s): LIPASE, AMYLASE in the last 168 hours. No results for input(s): AMMONIA in the last 168 hours. Coagulation Profile: No results for input(s): INR, PROTIME in the last 168 hours. CBC: Recent Labs  Lab 03/05/20 1445 03/06/20 0538  WBC 5.9 2.9*  NEUTROABS 4.2 2.1  HGB 14.2 12.8*  HCT 45.3 41.3  MCV 91.1 93.7  PLT 269 221   Cardiac Enzymes: No results for input(s): CKTOTAL, CKMB, CKMBINDEX, TROPONINI in the last 168 hours. BNP: Invalid input(s): POCBNP CBG: Recent Labs  Lab 03/05/20 2008 03/05/20 2209 03/05/20 2333 03/06/20 0048 03/06/20 0434  GLUCAP 119* 118* 139* 132* 142*   HbA1C: No results for input(s): HGBA1C in the last 72 hours. Urine analysis:    Component Value Date/Time   COLORURINE YELLOW 09/21/2019 0303   APPEARANCEUR CLEAR 09/21/2019 0303   LABSPEC 1.025 09/21/2019 0303   PHURINE 6.0 09/21/2019 0303   GLUCOSEU NEGATIVE 09/21/2019 0303   HGBUR NEGATIVE 09/21/2019 0303   BILIRUBINUR NEGATIVE 09/21/2019 0303   KETONESUR NEGATIVE 09/21/2019 0303   PROTEINUR NEGATIVE 09/21/2019 0303   UROBILINOGEN >8.0 (H) 08/15/2014 1134   NITRITE NEGATIVE 09/21/2019 0303   LEUKOCYTESUR NEGATIVE 09/21/2019 0303   Sepsis Labs: @LABRCNTIP (procalcitonin:4,lacticidven:4) ) Recent Results (from the past 240 hour(s))  Blood  Culture (routine x 2)     Status: None (Preliminary result)   Collection Time: 03/05/20  2:45 PM   Specimen: BLOOD LEFT HAND  Result Value Ref Range Status   Specimen Description BLOOD LEFT HAND  Final   Special Requests   Final    BOTTLES DRAWN AEROBIC AND ANAEROBIC Blood Culture adequate volume Performed at [facility], [address], [place], [place]    Culture PENDING  Incomplete   Report Status PENDING  Incomplete  Blood Culture (routine x 2)     Status: None (Preliminary result)   Collection Time: 03/05/20  2:45 PM   Specimen: BLOOD  Result Value Ref Range Status   Specimen Description BLOOD RIGHT ANTECUBITAL  Final   Special Requests   Final    BOTTLES DRAWN AEROBIC AND ANAEROBIC Blood Culture adequate volume Performed at [facility], [address] [place], [place] [address]    Culture PENDING  Incomplete   Report Status PENDING  Incomplete     Scheduled Meds: . dexamethasone  6 mg Oral Q24H  . enoxaparin (LOVENOX) injection  40 mg Subcutaneous Q24H  . insulin aspart  0-9 Units Subcutaneous Q4H  . mometasone-formoterol  2 puff Inhalation BID   Continuous Infusions: . remdesivir 100 mg in NS 100 mL      Procedures/Studies: CT Head Wo Contrast  Result Date: 03/05/2020 CLINICAL DATA:  Delirium EXAM: CT HEAD WITHOUT CONTRAST TECHNIQUE: Contiguous axial images were obtained from the base of the skull through the vertex without intravenous contrast. COMPARISON:  09/21/2019 FINDINGS: Brain: There is no acute intracranial hemorrhage, mass effect, or edema. Gray-white differentiation is preserved. There is no extra-axial fluid collection. Prominence of the ventricles and sulci reflects stable parenchymal volume loss. Patchy hypoattenuation in the supratentorial white matter is nonspecific but probably reflects stable chronic microvascular ischemic changes. Vascular: There is atherosclerotic calcification at the skull base. Skull: Calvarium is unremarkable.  Sinuses/Orbits: Paranasal sinus mucosal thickening with opacification of the right sphenoid. Orbits are unremarkable. Other: Mastoid air cells are clear. IMPRESSION: No acute intracranial abnormality. Stable chronic microvascular ischemic changes. Nonspecific right sphenoid sinus opacification, which could reflect acute sinusitis in the appropriate setting. Electronically Signed   By: Guadlupe Spanish M.D.   On: 03/05/2020 18:24   CT ANGIO CHEST PE W OR WO CONTRAST  Result Date: 03/05/2020 CLINICAL DATA:  Acute respiratory failure on 4 L. COVID +ve. Negative chest xray. EXAM: CT ANGIOGRAPHY CHEST WITH CONTRAST TECHNIQUE: Multidetector CT imaging of the chest was performed using the standard protocol during bolus administration of intravenous contrast. Multiplanar CT image reconstructions and MIPs were obtained to evaluate the vascular anatomy. CONTRAST:  OMNIPAQUE IOHEXOL 350 MG/ML SOLN COMPARISON:  Radiograph earlier today.  Chest CT 08/15/2018 FINDINGS: Cardiovascular: There are no filling defects within the pulmonary arteries to suggest pulmonary embolus. Basilar assessment is slightly limited by breathing motion artifact. Moderate to advanced aortic atherosclerosis with calcified and noncalcified plaque. Conventional branching from the aortic arch with branch atherosclerosis. Calcified plaque at the origin of the left common carotid artery causes approximately 50% luminal stenosis. Coronary artery calcifications, normal heart size. No pericardial effusion. Mediastinum/Nodes: 11 mm right hilar node, unchanged from prior exam. Scattered small mediastinal lymph nodes are not enlarged by size criteria. There is no thyroid nodule. No esophageal wall thickening. Small hiatal hernia. Lungs/Pleura: Moderate emphysema. Scattered areas of ground-glass opacity in the perifissural upper lobes, paramediastinal and dependent right lower lobe, and to a lesser extent right middle lobe and lingula. Right upper lobe  nodule measures 6 mm, previously 7 mm, series 6, image 52. Adjacent architectural distortion and bandlike opacity typical of scarring. Mild biapical pleuroparenchymal scarring. Central bronchial thickening again seen. Retained mucus in the left mainstem bronchus with scattered areas of distal mucous plugging in the lower lobes. No pleural fluid. Upper Abdomen: No acute findings. Small cysts in the upper right kidney. Musculoskeletal: Thoracic spondylosis. There are no acute or suspicious osseous abnormalities. Review of the MIP images confirms the above findings. IMPRESSION: 1. No pulmonary embolus. 2. Scattered areas of ground-glass opacity throughout both lungs, likely related to COVID pneumonia. 3. Right upper lobe nodule measures 6 mm, previously 7 mm. This suggests benign post infectious or inflammatory etiology.  Adjacent architectural distortion and bandlike opacity typical of scarring. 4. Moderate emphysema. Central bronchial thickening with retained mucus in the left lower lobe bronchus. 5. Aortic atherosclerosis.  Coronary artery calcifications. Aortic Atherosclerosis (ICD10-I70.0) and Emphysema (ICD10-J43.9). Electronically Signed   By: Narda RutherfordMelanie  Sanford M.D.   On: 03/05/2020 20:18   DG Chest Port 1 View  Result Date: 03/05/2020 CLINICAL DATA:  Shortness of breath, fever, anorexia for 4 days, cough, decreased oxygen saturation of 74% EXAM: PORTABLE CHEST 1 VIEW COMPARISON:  Portable exam 1506 hours compared to 09/21/2019 FINDINGS: Normal heart size, mediastinal contours, and pulmonary vascularity. Lungs clear. Questionable nodular density LEFT lung versus prominent LEFT sixth rib costochondral junction or nipple shadow. No pleural effusion or pneumothorax. Bones demineralized. IMPRESSION: Question LEFT nipple shadow versus nodular density are prominent LEFT sixth costochondral junction; follow-up upright PA chest radiograph with nipple markers recommended to exclude pulmonary nodule. Electronically  Signed   By: Ulyses SouthwardMark  Boles M.D.   On: 03/05/2020 15:13    Catarina Hartshornavid Jobina Maita, DO  [facility]  If 7PM-7AM, please contact night-coverage [email or web address] Password [email or web address] 03/06/2020, 7:17 AM   LOS: 1 day

## 2020-03-06 NOTE — Plan of Care (Signed)

## 2020-03-06 NOTE — Progress Notes (Signed)
Informed Dr. Arbutus Leas of pt BP 188/80. Await on new orders.

## 2020-03-06 NOTE — Progress Notes (Signed)
Pt refused dinner tray

## 2020-03-07 DIAGNOSIS — J441 Chronic obstructive pulmonary disease with (acute) exacerbation: Secondary | ICD-10-CM | POA: Diagnosis not present

## 2020-03-07 DIAGNOSIS — J9601 Acute respiratory failure with hypoxia: Secondary | ICD-10-CM | POA: Diagnosis not present

## 2020-03-07 DIAGNOSIS — E876 Hypokalemia: Secondary | ICD-10-CM | POA: Diagnosis not present

## 2020-03-07 DIAGNOSIS — U071 COVID-19: Secondary | ICD-10-CM | POA: Diagnosis not present

## 2020-03-07 LAB — CBC WITH DIFFERENTIAL/PLATELET
Band Neutrophils: 2 %
Basophils Absolute: 0 10*3/uL (ref 0.0–0.1)
Basophils Relative: 0 %
Eosinophils Absolute: 0 10*3/uL (ref 0.0–0.5)
Eosinophils Relative: 0 %
HCT: 40.7 % (ref 39.0–52.0)
Hemoglobin: 12.9 g/dL — ABNORMAL LOW (ref 13.0–17.0)
Lymphocytes Relative: 12 %
Lymphs Abs: 0.7 10*3/uL (ref 0.7–4.0)
MCH: 28.9 pg (ref 26.0–34.0)
MCHC: 31.7 g/dL (ref 30.0–36.0)
MCV: 91.1 fL (ref 80.0–100.0)
Monocytes Absolute: 0.4 10*3/uL (ref 0.1–1.0)
Monocytes Relative: 6 %
Neutro Abs: 5.1 10*3/uL (ref 1.7–7.7)
Neutrophils Relative %: 80 %
Platelets: 257 10*3/uL (ref 150–400)
RBC: 4.47 MIL/uL (ref 4.22–5.81)
RDW: 13.3 % (ref 11.5–15.5)
WBC: 6.2 10*3/uL (ref 4.0–10.5)
nRBC: 0 % (ref 0.0–0.2)

## 2020-03-07 LAB — GLUCOSE, CAPILLARY
Glucose-Capillary: 169 mg/dL — ABNORMAL HIGH (ref 70–99)
Glucose-Capillary: 140 mg/dL — ABNORMAL HIGH (ref 70–99)
Glucose-Capillary: 157 mg/dL — ABNORMAL HIGH (ref 70–99)
Glucose-Capillary: 147 mg/dL — ABNORMAL HIGH (ref 70–99)

## 2020-03-07 LAB — COMPREHENSIVE METABOLIC PANEL
ALT: 20 U/L (ref 0–44)
AST: 38 U/L (ref 15–41)
Albumin: 2.9 g/dL — ABNORMAL LOW (ref 3.5–5.0)
Alkaline Phosphatase: 31 U/L — ABNORMAL LOW (ref 38–126)
Anion gap: 13 (ref 5–15)
BUN: 20 mg/dL (ref 8–23)
CO2: 31 mmol/L (ref 22–32)
Calcium: 8.4 mg/dL — ABNORMAL LOW (ref 8.9–10.3)
Chloride: 100 mmol/L (ref 98–111)
Creatinine, Ser: 0.73 mg/dL (ref 0.61–1.24)
GFR, Estimated: >60 mL/min (ref >60)
Glucose, Bld: 170 mg/dL — ABNORMAL HIGH (ref 70–99)
Potassium: 2.5 mmol/L — CL (ref 3.5–5.1)
Sodium: 144 mmol/L (ref 135–145)
Total Bilirubin: 0.8 mg/dL (ref 0.3–1.2)
Total Protein: 6.8 g/dL (ref 6.5–8.1)

## 2020-03-07 LAB — FERRITIN: Ferritin: 232 ng/mL (ref 24–336)

## 2020-03-07 LAB — MAGNESIUM: Magnesium: 1.9 mg/dL (ref 1.7–2.4)

## 2020-03-07 LAB — D-DIMER, QUANTITATIVE: D-Dimer, Quant: 1.12 ug/mL-FEU — ABNORMAL HIGH (ref 0.00–0.50)

## 2020-03-07 LAB — PHOSPHORUS: Phosphorus: 2.9 mg/dL (ref 2.5–4.6)

## 2020-03-07 LAB — C-REACTIVE PROTEIN: CRP: 4.9 mg/dL — ABNORMAL HIGH (ref <1.0)

## 2020-03-07 MED ORDER — IPRATROPIUM-ALBUTEROL 20-100 MCG/ACT IN AERS
2.0000 | INHALATION_SPRAY | Freq: Three times a day (TID) | RESPIRATORY_TRACT | Status: DC
  Administered 2020-03-07 – 2020-03-08 (×2): 2 via RESPIRATORY_TRACT

## 2020-03-07 MED ORDER — CARVEDILOL 12.5 MG PO TABS
25.0000 mg | ORAL_TABLET | Freq: Two times a day (BID) | ORAL | Status: DC
  Administered 2020-03-07 – 2020-03-08 (×3): 25 mg via ORAL
  Filled 2020-03-07: qty 2
  Filled 2020-03-07: qty 8
  Filled 2020-03-07: qty 2

## 2020-03-07 MED ORDER — POTASSIUM CHLORIDE CRYS ER 20 MEQ PO TBCR
40.0000 meq | EXTENDED_RELEASE_TABLET | Freq: Four times a day (QID) | ORAL | Status: AC
  Administered 2020-03-07 (×2): 40 meq via ORAL
  Filled 2020-03-07: qty 4
  Filled 2020-03-07: qty 2

## 2020-03-07 NOTE — Plan of Care (Signed)

## 2020-03-07 NOTE — Progress Notes (Signed)
SATURATION QUALIFICATIONS: (This note is used to comply with regulatory documentation for home oxygen)  Patient Saturations on Room Air at Rest = 86%  Patient Saturations on Room Air while Ambulating = N/A  Patient Saturations on 3 Liters of oxygen while Ambulating = 94  Please briefly explain why patient needs home oxygen: To maintain 02 sat at 90% or above during ambulation.  Catarina Hartshorn, DO

## 2020-03-07 NOTE — Progress Notes (Signed)
PROGRESS NOTE  Mark BillsFreddie L Lattin XLK:[id number] DOB: 11/20/1945 DOA: 03/05/2020 [facility]: [facility] History:  75 year old male with history of COPD, diabetes mellitus type 2, PTSD, and peripheral neuropathy presenting with 4-day history of shortness of breath, fevers, coughing.  He quit smoking about 10 to 15 years ago.  He denied any chest pain, headache hemoptysis, nausea, vomiting, but did endorse some loose stools over the past 3 to 4 days.  He denies any hematochezia, melena, dysuria, hematuria. In addition, the patient is complaining of some generalized weakness. In the emergency department, the patient was febrile to 101.3 F.  He was hemodynamically stable with oxygen saturation of 75% on room air.  He was placed on 4 L with saturation 96-97%.  The patient has been vaccinated for COVID-19, but had not received his booster.  CTA chest was negative for PE but showed scattered groundglass opacities bilateral.  BMP showed a potassium of 2.5 and serum creatinine 1.15.  LFTs were unremarkable.  WBC 5.9, hemoglobin 14.2, platelets 269,000.  The patient was started on remdesivir and IV steroids.  He also received a dose of Actemra in ED.  Assessment/Plan: Acute respiratory failure with hypoxia secondary COVID-19 -Presented with oxygen saturation 75% on room air -Currently stable on 4 L>>3L -CRP 11.0>>4.9 -D-dimer 0.78>> 1.03>>1.12 -Ferritin 344>>232 -PCT<0.10 -Continue remdesivir and IV steroids -Vitamin C and zinc -Prone positioning as much as tolerated -03/05/2020 CTA chest--negative PE, scattered groundglass opacities bilateral, biapical scarring, retained mucus in the left mainstem and distal mucous plugging in the LLs  COPD exacerbation -Patient still has some mild wheezing on exam -03/05/2020 ABG 7.3 6/58/90/30 on 4 L -Continue IV steroids -Continue Combivent  Acute metabolic encephalopathy -The patient was initially lethargic in the emergency  department -03/06/2020--A&O x 4 -Obtain UA --no pyuria -Check B12--719 -Folic acid--19.5 -TSH--0.050; Free T4--0.98 -Holding gabapentin and sinequan temporarily  DM2 -Does not appear the patient was on any agents in the outpatient setting -NovoLog sliding scale -Hemoglobin A1c--6.7  Essential hypertension -Restart carvedilol  Hypokalemia -Replete -Magnesium 2.1  Hypophosphatemia -continue K-Phos  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        Family Communication:  no Family at bedside  Consultants:none    Code Status:  FULL  DVT Prophylaxis:  [place] Lovenox   Procedures: As Listed in Progress Note Above  Antibiotics: None        Subjective: Patient is breathing better.  Complains of loose stool.  Nursing reports only 1 BM today.  Denies f/c, cp, n/v, hematochezia, melena.  +dry cough  Objective: Vitals:   03/07/20 0346 03/07/20 0850 03/07/20 1430 03/07/20 1601  BP: (!) 155/75  (!) 160/81   Pulse: 68  73   Resp: 18  20   Temp: 98.1 F (36.7 C)  98.4 F (36.9 C)   TempSrc:   Oral   SpO2: 94% 95% 91% 92%  Weight:      Height:        Intake/Output Summary (Last 24 hours) at 03/07/2020 1603 Last data filed at 03/07/2020 1300 Gross per 24 hour  Intake 102.29 ml  Output 1500 ml  Net -1397.71 ml  Weight change:  Exam:   General:  Pt is alert, follows commands appropriately, not in acute distress  HEENT: No icterus, No thrush, No neck mass, [place]/AT  Cardiovascular: RRR, S1/S2, no rubs, no gallops  Respiratory: bilateral rales. No wheeze  Abdomen: Soft/+BS, non tender, non distended, no guarding  Extremities:  No edema, No lymphangitis, No petechiae, No rashes, no synovitis   Data Reviewed: I have personally reviewed following labs and imaging studies Basic Metabolic Panel: Recent Labs  Lab 03/05/20 1445 03/05/20 1702 03/06/20 0538 03/07/20 0421  NA 139  --  141 144  K 2.5*  --  3.0* 2.5*  CL 91*  --  100 100  CO2 34*  --  31 31  GLUCOSE 109*  --  160* 170*  BUN 15  --  16 20  CREATININE 1.15  --  0.76 0.73  CALCIUM 8.9  --  7.8* 8.4*  MG  --  2.5* 2.1 1.9  PHOS  --   --  2.4* 2.9   Liver Function Tests: Recent Labs  Lab 03/05/20 1445 03/06/20 0538 03/07/20 0421  AST 50* 36 38  ALT 23 18 20   ALKPHOS 40 28* 31*  BILITOT 1.2 1.0 0.8  PROT 8.3* 6.3* 6.8  ALBUMIN 3.7 2.7* 2.9*   No results for input(s): LIPASE, AMYLASE in the last 168 hours. No results for input(s): AMMONIA in the last 168 hours. Coagulation Profile: No results for input(s): INR, PROTIME in the last 168 hours. CBC: Recent Labs  Lab 03/05/20 1445 03/06/20 0538 03/07/20 0421  WBC 5.9 2.9* 6.2  NEUTROABS 4.2 2.1 5.1  HGB 14.2 12.8* 12.9*  HCT 45.3 41.3 40.7  MCV 91.1 93.7 91.1  PLT 269 221 257   Cardiac Enzymes: No results for input(s): CKTOTAL, CKMB, CKMBINDEX, TROPONINI in the last 168 hours. BNP: Invalid input(s): POCBNP CBG: Recent Labs  Lab 03/06/20 2021 03/06/20 2337 03/07/20 0344 03/07/20 0813 03/07/20 1147  GLUCAP 150* 161* 169* 140* 157*   HbA1C: Recent Labs    03/05/20 1445  HGBA1C 6.7*   Urine analysis:    Component Value Date/Time   COLORURINE YELLOW 03/06/2020 0724   APPEARANCEUR CLEAR 03/06/2020 0724   LABSPEC 1.034 (H) 03/06/2020 0724   PHURINE 6.0 03/06/2020 0724   GLUCOSEU NEGATIVE 03/06/2020 0724   HGBUR SMALL (A) 03/06/2020 0724   BILIRUBINUR NEGATIVE 03/06/2020 0724   KETONESUR 5 (A) 03/06/2020 0724   PROTEINUR 100 (A) 03/06/2020 0724   UROBILINOGEN >8.0 (H) 08/15/2014 1134   NITRITE NEGATIVE 03/06/2020 0724   LEUKOCYTESUR NEGATIVE 03/06/2020 0724    Sepsis Labs: @LABRCNTIP (procalcitonin:4,lacticidven:4) ) Recent Results (from the past 240 hour(s))  Blood Culture (routine x 2)     Status: None (Preliminary result)   Collection Time: 03/05/20  2:45 PM   Specimen: BLOOD LEFT HAND  Result Value Ref Range Status   Specimen Description BLOOD LEFT HAND  Final   Special Requests   Final    BOTTLES DRAWN AEROBIC AND ANAEROBIC Blood Culture adequate volume   Culture   Final    NO GROWTH 2 DAYS Performed at [facility], [address], [place], [address] [place]    Report Status PENDING  Incomplete  Blood Culture (routine x 2)     Status: None (Preliminary result)   Collection Time: 03/05/20  2:45 PM   Specimen: BLOOD  Result Value Ref Range Status   Specimen Description BLOOD RIGHT ANTECUBITAL  Final   Special Requests   Final    BOTTLES DRAWN  AEROBIC AND ANAEROBIC Blood Culture adequate volume   Culture   Final    NO GROWTH 2 DAYS Performed at [facility], [address], [place], [place] [address]    Report Status PENDING  Incomplete     Scheduled Meds: . carvedilol  25 mg Oral BID WC  . enoxaparin (LOVENOX) injection  40 mg Subcutaneous Q24H  . insulin aspart  0-9 Units Subcutaneous Q4H  . Ipratropium-Albuterol  2 puff Inhalation Q6H  . methylPREDNISolone (SOLU-MEDROL) injection  80 mg Intravenous Q12H  . mometasone-formoterol  2 puff Inhalation BID  . phosphorus  500 mg Oral BID  . potassium chloride  40 mEq Oral Q6H   Continuous Infusions: . remdesivir 100 mg in NS 100 mL 100 mg (03/07/20 0932)    Procedures/Studies: CT Head Wo Contrast  Result Date: 03/05/2020 CLINICAL DATA:  Delirium EXAM: CT HEAD WITHOUT CONTRAST TECHNIQUE: Contiguous axial images were obtained from the base of the skull through the vertex without intravenous contrast. COMPARISON:  09/21/2019 FINDINGS: Brain: There is no acute intracranial hemorrhage, mass effect, or edema. Gray-white differentiation is preserved. There is no extra-axial fluid  collection. Prominence of the ventricles and sulci reflects stable parenchymal volume loss. Patchy hypoattenuation in the supratentorial white matter is nonspecific but probably reflects stable chronic microvascular ischemic changes. Vascular: There is atherosclerotic calcification at the skull base. Skull: Calvarium is unremarkable. Sinuses/Orbits: Paranasal sinus mucosal thickening with opacification of the right sphenoid. Orbits are unremarkable. Other: Mastoid air cells are clear. IMPRESSION: No acute intracranial abnormality. Stable chronic microvascular ischemic changes. Nonspecific right sphenoid sinus opacification, which could reflect acute sinusitis in the appropriate setting. Electronically Signed   By: Guadlupe Spanish M.D.   On: 03/05/2020 18:24   CT ANGIO CHEST PE W OR WO CONTRAST  Result Date: 03/05/2020 CLINICAL DATA:  Acute respiratory failure on 4 L. COVID +ve. Negative chest xray. EXAM: CT ANGIOGRAPHY CHEST WITH CONTRAST TECHNIQUE: Multidetector CT imaging of the chest was performed using the standard protocol during bolus administration of intravenous contrast. Multiplanar CT image reconstructions and MIPs were obtained to evaluate the vascular anatomy. CONTRAST:  OMNIPAQUE IOHEXOL 350 MG/ML SOLN COMPARISON:  Radiograph earlier today.  Chest CT 08/15/2018 FINDINGS: Cardiovascular: There are no filling defects within the pulmonary arteries to suggest pulmonary embolus. Basilar assessment is slightly limited by breathing motion artifact. Moderate to advanced aortic atherosclerosis with calcified and noncalcified plaque. Conventional branching from the aortic arch with branch atherosclerosis. Calcified plaque at the origin of the left common carotid artery causes approximately 50% luminal stenosis. Coronary artery calcifications, normal heart size. No pericardial effusion. Mediastinum/Nodes: 11 mm right hilar node, unchanged from prior exam. Scattered small mediastinal lymph nodes are not  enlarged by size criteria. There is no thyroid nodule. No esophageal wall thickening. Small hiatal hernia. Lungs/Pleura: Moderate emphysema. Scattered areas of ground-glass opacity in the perifissural upper lobes, paramediastinal and dependent right lower lobe, and to a lesser extent right middle lobe and lingula. Right upper lobe nodule measures 6 mm, previously 7 mm, series 6, image 52. Adjacent architectural distortion and bandlike opacity typical of scarring. Mild biapical pleuroparenchymal scarring. Central bronchial thickening again seen. Retained mucus in the left mainstem bronchus with scattered areas of distal mucous plugging in the lower lobes. No pleural fluid. Upper Abdomen: No acute findings. Small cysts in the upper right kidney. Musculoskeletal: Thoracic spondylosis. There are no acute or suspicious osseous abnormalities. Review of the MIP images confirms the above findings. IMPRESSION: 1. No pulmonary embolus. 2.  Scattered areas of ground-glass opacity throughout both lungs, likely related to COVID pneumonia. 3. Right upper lobe nodule measures 6 mm, previously 7 mm. This suggests benign post infectious or inflammatory etiology. Adjacent architectural distortion and bandlike opacity typical of scarring. 4. Moderate emphysema. Central bronchial thickening with retained mucus in the left lower lobe bronchus. 5. Aortic atherosclerosis.  Coronary artery calcifications. Aortic Atherosclerosis (ICD10-I70.0) and Emphysema (ICD10-J43.9). Electronically Signed   By: Narda Rutherford M.D.   On: 03/05/2020 20:18   DG Chest Port 1 View  Result Date: 03/05/2020 CLINICAL DATA:  Shortness of breath, fever, anorexia for 4 days, cough, decreased oxygen saturation of 74% EXAM: PORTABLE CHEST 1 VIEW COMPARISON:  Portable exam 1506 hours compared to 09/21/2019 FINDINGS: Normal heart size, mediastinal contours, and pulmonary vascularity. Lungs clear. Questionable nodular density LEFT lung versus prominent LEFT sixth  rib costochondral junction or nipple shadow. No pleural effusion or pneumothorax. Bones demineralized. IMPRESSION: Question LEFT nipple shadow versus nodular density are prominent LEFT sixth costochondral junction; follow-up upright PA chest radiograph with nipple markers recommended to exclude pulmonary nodule. Electronically Signed   By: Ulyses Southward M.D.   On: 03/05/2020 15:13    Catarina Hartshorn, DO  [facility]  If 7PM-7AM, please contact night-coverage [email or web address] Password [email or web address] 03/07/2020, 4:03 PM   LOS: 2 days

## 2020-03-07 NOTE — TOC Initial Note (Addendum)
Transition of Care [facility]) - Initial/Assessment Note    Patient Details  Name: Mark Schroeder MRN: [id number] Date of Birth: 08-Jul-1945  Transition of Care [facility]) CM/SW Contact:    Ricardo Kayes, Chrystine Oiler, RN Phone Number: 03/07/2020, 3:34 PM  Clinical Narrative:   Patient qualifies for home oxygen. Listed as having VA benefits.  Call to [facility] oxygen department [phone] ext [phone]  to arrange. No answer, left message x2.   Also faxed qualifying note, O2 order, and H&P to [place] at fax [phone]. Fax repeatedly busy.   ADDENDUM: Still no return call from [place].    Expected Discharge Plan: Home/Self Care Barriers to Discharge: Continued Medical Work up        Expected Discharge Plan and Services Expected Discharge Plan: Home/Self Care                         DME Arranged: Oxygen             Activities of Daily Living Home Assistive Devices/Equipment: Prosthesis ADL Screening (condition at time of admission) Patient's cognitive ability adequate to safely complete daily activities?: Yes Is the patient deaf or have difficulty hearing?: No Does the patient have difficulty seeing, even when wearing glasses/contacts?: No Does the patient have difficulty concentrating, remembering, or making decisions?: No Patient able to express need for assistance with ADLs?: Yes Does the patient have difficulty dressing or bathing?: Yes Independently performs ADLs?: Yes (appropriate for developmental age) Does the patient have difficulty walking or climbing stairs?: Yes Weakness of Legs: None Weakness of Arms/Hands: None   Admission diagnosis:  Hypokalemia [E87.6] Acute respiratory failure with hypoxia (HCC) [J96.01] COVID [U07.1] Pneumonia due to COVID-19 virus [U07.1, J12.82] Patient Active Problem List   Diagnosis Date Noted  . COPD with acute exacerbation (HCC) 03/06/2020  . Pneumonia due to COVID-19 virus 03/05/2020  . Hx of BKA, right (HCC) 03/05/2020  . Acute respiratory  failure with hypoxia (HCC)   . Sepsis due to undetermined organism (HCC) 08/15/2019  . Hyperbilirubinemia 04/13/2019  . Hypertension   . Depression   . PTSD (post-traumatic stress disorder)   . Diabetic peripheral neuropathy (HCC)   . Hyperlipidemia   . CAP (community acquired pneumonia) 08/15/2014  . COPD with exacerbation (HCC) 08/15/2014  . Hypokalemia 08/15/2014  . Acute kidney injury (HCC) 08/15/2014  . Normocytic anemia 08/15/2014  . Type 2 diabetes mellitus (HCC) 08/15/2014   PCP:  [facility]acy:  No Pharmacies Listed    Social Determinants of Health (SDOH) Interventions    Readmission Risk Interventions No flowsheet data found.

## 2020-03-07 NOTE — Progress Notes (Signed)
CRITICAL VALUE STICKER  CRITICAL VALUE: Potassium-2.5  RECEIVER (on-site recipient of call):  DATE & TIME NOTIFIED:  03/07/2020 @ 0645  MESSENGER (representative from lab):  MD NOTIFIED: C. Emokpae  TIME OF NOTIFICATION: 0719  RESPONSE:

## 2020-03-07 NOTE — Clinical Social Work Note (Signed)
VA Emergency Notification complete. Notification ID # is: [id number] [id number].

## 2020-03-07 NOTE — Progress Notes (Signed)
SATURATION QUALIFICATIONS: (This note is used to comply with regulatory documentation for home oxygen)  Patient Saturations on Room Air at Rest = 88%  Patient Saturations on Room Air on exertion Patient Saturations on 2 Liters of oxygen while Ambulating = 92%  Please briefly explain why patient needs home oxygen:

## 2020-03-08 DIAGNOSIS — U071 COVID-19: Secondary | ICD-10-CM | POA: Diagnosis not present

## 2020-03-08 DIAGNOSIS — J1282 Pneumonia due to coronavirus disease 2019: Secondary | ICD-10-CM | POA: Diagnosis not present

## 2020-03-08 DIAGNOSIS — J441 Chronic obstructive pulmonary disease with (acute) exacerbation: Secondary | ICD-10-CM | POA: Diagnosis not present

## 2020-03-08 DIAGNOSIS — E876 Hypokalemia: Secondary | ICD-10-CM | POA: Diagnosis not present

## 2020-03-08 LAB — GLUCOSE, CAPILLARY
Glucose-Capillary: 152 mg/dL — ABNORMAL HIGH (ref 70–99)
Glucose-Capillary: 162 mg/dL — ABNORMAL HIGH (ref 70–99)
Glucose-Capillary: 238 mg/dL — ABNORMAL HIGH (ref 70–99)
Glucose-Capillary: 219 mg/dL — ABNORMAL HIGH (ref 70–99)

## 2020-03-08 LAB — CBC WITH DIFFERENTIAL/PLATELET
Abs Immature Granulocytes: 0.04 10*3/uL (ref 0.00–0.07)
Basophils Absolute: 0 10*3/uL (ref 0.0–0.1)
Basophils Relative: 0 %
Eosinophils Absolute: 0 10*3/uL (ref 0.0–0.5)
Eosinophils Relative: 0 %
HCT: 41.9 % (ref 39.0–52.0)
Hemoglobin: 13.3 g/dL (ref 13.0–17.0)
Immature Granulocytes: 0 %
Lymphocytes Relative: 8 %
Lymphs Abs: 0.7 10*3/uL (ref 0.7–4.0)
MCH: 28.4 pg (ref 26.0–34.0)
MCHC: 31.7 g/dL (ref 30.0–36.0)
MCV: 89.5 fL (ref 80.0–100.0)
Monocytes Absolute: 0.4 10*3/uL (ref 0.1–1.0)
Monocytes Relative: 4 %
Neutro Abs: 7.8 10*3/uL — ABNORMAL HIGH (ref 1.7–7.7)
Neutrophils Relative %: 88 %
Platelets: 272 10*3/uL (ref 150–400)
RBC: 4.68 MIL/uL (ref 4.22–5.81)
RDW: 13.3 % (ref 11.5–15.5)
WBC: 8.9 10*3/uL (ref 4.0–10.5)
nRBC: 0 % (ref 0.0–0.2)

## 2020-03-08 LAB — URINE CULTURE

## 2020-03-08 LAB — COMPREHENSIVE METABOLIC PANEL
ALT: 21 U/L (ref 0–44)
AST: 34 U/L (ref 15–41)
Albumin: 3.1 g/dL — ABNORMAL LOW (ref 3.5–5.0)
Alkaline Phosphatase: 33 U/L — ABNORMAL LOW (ref 38–126)
Anion gap: 13 (ref 5–15)
BUN: 18 mg/dL (ref 8–23)
CO2: 29 mmol/L (ref 22–32)
Calcium: 8.4 mg/dL — ABNORMAL LOW (ref 8.9–10.3)
Chloride: 101 mmol/L (ref 98–111)
Creatinine, Ser: 0.69 mg/dL (ref 0.61–1.24)
GFR, Estimated: >60 mL/min (ref >60)
Glucose, Bld: 187 mg/dL — ABNORMAL HIGH (ref 70–99)
Potassium: 2.5 mmol/L — CL (ref 3.5–5.1)
Sodium: 143 mmol/L (ref 135–145)
Total Bilirubin: 0.8 mg/dL (ref 0.3–1.2)
Total Protein: 6.8 g/dL (ref 6.5–8.1)

## 2020-03-08 LAB — D-DIMER, QUANTITATIVE: D-Dimer, Quant: 0.96 ug/mL-FEU — ABNORMAL HIGH (ref 0.00–0.50)

## 2020-03-08 LAB — BASIC METABOLIC PANEL
Anion gap: 11 (ref 5–15)
BUN: 18 mg/dL (ref 8–23)
CO2: 31 mmol/L (ref 22–32)
Calcium: 8.4 mg/dL — ABNORMAL LOW (ref 8.9–10.3)
Chloride: 100 mmol/L (ref 98–111)
Creatinine, Ser: 0.87 mg/dL (ref 0.61–1.24)
GFR, Estimated: >60 mL/min (ref >60)
Glucose, Bld: 140 mg/dL — ABNORMAL HIGH (ref 70–99)
Potassium: 3 mmol/L — ABNORMAL LOW (ref 3.5–5.1)
Sodium: 142 mmol/L (ref 135–145)

## 2020-03-08 LAB — MAGNESIUM: Magnesium: 1.8 mg/dL (ref 1.7–2.4)

## 2020-03-08 LAB — FERRITIN: Ferritin: 163 ng/mL (ref 24–336)

## 2020-03-08 LAB — C-REACTIVE PROTEIN: CRP: 2.3 mg/dL — ABNORMAL HIGH (ref <1.0)

## 2020-03-08 LAB — PHOSPHORUS: Phosphorus: 2.9 mg/dL (ref 2.5–4.6)

## 2020-03-08 MED ORDER — POTASSIUM CHLORIDE CRYS ER 20 MEQ PO TBCR
20.0000 meq | EXTENDED_RELEASE_TABLET | Freq: Two times a day (BID) | ORAL | Status: DC
  Administered 2020-03-08: 20 meq via ORAL
  Filled 2020-03-08: qty 1

## 2020-03-08 MED ORDER — POTASSIUM CHLORIDE CRYS ER 20 MEQ PO TBCR: 20.0000 meq | EXTENDED_RELEASE_TABLET | Freq: Two times a day (BID) | ORAL | 0 refills | Status: DC

## 2020-03-08 MED ORDER — POTASSIUM CHLORIDE 10 MEQ/100ML IV SOLN
10.0000 meq | INTRAVENOUS | Status: AC
  Administered 2020-03-08 (×4): 10 meq via INTRAVENOUS
  Filled 2020-03-08 (×3): qty 100

## 2020-03-08 MED ORDER — IPRATROPIUM-ALBUTEROL 20-100 MCG/ACT IN AERS: 2.0000 | INHALATION_SPRAY | Freq: Two times a day (BID) | RESPIRATORY_TRACT | Status: DC

## 2020-03-08 MED ORDER — POTASSIUM CHLORIDE CRYS ER 20 MEQ PO TBCR
40.0000 meq | EXTENDED_RELEASE_TABLET | Freq: Two times a day (BID) | ORAL | Status: DC
  Administered 2020-03-08: 40 meq via ORAL
  Filled 2020-03-08: qty 2

## 2020-03-08 MED ORDER — CARVEDILOL 25 MG PO TABS: 25.0000 mg | ORAL_TABLET | Freq: Two times a day (BID) | ORAL | 1 refills | Status: AC

## 2020-03-08 MED ORDER — CARVEDILOL 25 MG PO TABS: 25.0000 mg | ORAL_TABLET | Freq: Two times a day (BID) | ORAL | 1 refills | Status: DC

## 2020-03-08 MED ORDER — PREDNISONE 20 MG PO TABS
50.0000 mg | ORAL_TABLET | Freq: Two times a day (BID) | ORAL | Status: DC
  Administered 2020-03-08: 50 mg via ORAL
  Filled 2020-03-08: qty 1

## 2020-03-08 MED ORDER — POTASSIUM CHLORIDE CRYS ER 20 MEQ PO TBCR
40.0000 meq | EXTENDED_RELEASE_TABLET | Freq: Once | ORAL | Status: AC
  Administered 2020-03-08: 40 meq via ORAL
  Filled 2020-03-08: qty 2

## 2020-03-08 MED ORDER — PREDNISONE 50 MG PO TABS: 50.0000 mg | ORAL_TABLET | Freq: Two times a day (BID) | ORAL | 0 refills | Status: AC

## 2020-03-08 MED ORDER — SPIRONOLACTONE 25 MG PO TABS
25.0000 mg | ORAL_TABLET | Freq: Every day | ORAL | Status: DC
  Administered 2020-03-08: 25 mg via ORAL
  Filled 2020-03-08: qty 1

## 2020-03-08 MED ORDER — SPIRONOLACTONE 25 MG PO TABS: 25.0000 mg | ORAL_TABLET | Freq: Every day | ORAL | 1 refills | Status: DC

## 2020-03-08 NOTE — TOC Transition Note (Signed)
Transition of Care [facility]) - CM/SW Discharge Note   Patient Details  Name: Mark Schroeder MRN: [id number] Date of Birth: 1945/06/12  Transition of Care [facility]) CM/SW Contact:  Barry Brunner, LCSW Phone Number: 03/08/2020, 12:18 PM   Clinical Narrative:    CSW notified of patient's readiness for discharge. CSW contacted Thereasa Distance with [employer] for O2 needs. [employer] agreeable to provide O2. CSW contacted Denyse Amass with [facility] for [facility] needs. Denyse Amass agreeable to provide [facility] services to patient. TOC signing off.   Final next level of care: Home w Home Health Services Barriers to Discharge: Barriers Resolved   Patient Goals and CMS Choice Patient states their goals for this hospitalization and ongoing recovery are:: Return home with [facility] CMS Medicare.gov Compare Post Acute Care list provided to:: Patient Choice offered to / list presented to : Patient  Discharge Placement                    Patient and family notified of of transfer: 03/08/20  Discharge Plan and Services                DME Arranged: Oxygen DME Agency: [employer] Date DME Agency Contacted: 03/08/20 Time DME Agency Contacted: 1205 Representative spoke with at DME Agency: Shelby Dubin Arranged: PT HH Agency: [facility] Date [facility] Agency Contacted: 03/08/20 Time HH Agency Contacted: 1205 Representative spoke with at [facility] Agency: Denyse Amass  Social Determinants of Health (SDOH) Interventions     Readmission Risk Interventions No flowsheet data found.

## 2020-03-08 NOTE — Progress Notes (Signed)
SATURATION QUALIFICATIONS: (This note is used to comply with regulatory documentation for home oxygen)  Patient Saturations on Room Air at Rest = 89%  Patient Saturations on Room Air while Ambulating = 86%  Patient Saturations on 2 Liters of oxygen while Ambulating = 93%  Please briefly explain why patient needs home oxygen: To maintain 02 sat at 90% or above during ambulation.   Mark Hartshorn, DO

## 2020-03-08 NOTE — Evaluation (Signed)
Physical Therapy Evaluation Patient Details Name: Mark Schroeder MRN: [id number] DOB: [birth year] Today's Date: 03/08/2020   History of Present Illness  Mark Schroeder is a 75 y.o. male with medical history significant for COPD, diabetes mellitus with neuropathy, PTSD and depression.  Patient was brought to the ED with complaints of difficulty breathing, fever and cough of 4 days duration.  At the time of my evaluation, patient was lethargic and drowsy, able to tell me his name but quickly dropping back to sleep.  History is mostly obtained from chart review.  Patient and his wife had a cold 4 days ago Saturday.  Spouse also reported that patient appeared confused the next day Sunday.  Patient has been vaccinated for COVID x2, but did not receive booster dose.    Clinical Impression  Patient functioning near baseline for functional mobility and gait, other than SpO2 dropping from 89% to 86% while on room air during ambulation, demonstrates mostly step-to pattern without loss of balance and limited mostly due to c/o fatigue.  Patient tolerated sitting up in chair after therapy and put back on 2 LPM after therapy - RN/MD notified.  Plan:  Patient discharged from physical therapy to care of nursing for ambulation daily as tolerated for length of stay.     Follow Up Recommendations Home health PT    Equipment Recommendations  None recommended by PT    Recommendations for Other Services       Precautions / Restrictions Precautions Precautions: None Restrictions Weight Bearing Restrictions: No      Mobility  Bed Mobility Overal bed mobility: Modified Independent             General bed mobility comments: increased time with HOB raised    Transfers Overall transfer level: Needs assistance Equipment used: Straight cane Transfers: Sit to/from Stand;Stand Pivot Transfers Sit to Stand: Modified independent (Device/Increase time);Supervision Stand pivot transfers:  Supervision;Modified independent (Device/Increase time)       General transfer comment: increased time, labored movement  Ambulation/Gait Ambulation/Gait assistance: Supervision Gait Distance (Feet): 45 Feet Assistive device: Straight cane Gait Pattern/deviations: Step-to pattern;Decreased step length - left;Decreased stance time - right;Decreased stride length Gait velocity: decreased   General Gait Details: slow labored cadence without loss of balannce, limited mostly due to c/o fatigue, on room air with SpO2 dropping from 89% to 86%  Stairs            Wheelchair Mobility    Modified Rankin (Stroke Patients Only)       Balance Overall balance assessment: Needs assistance Sitting-balance support: Feet supported;No upper extremity supported Sitting balance-Leahy Scale: Good Sitting balance - Comments: seated at EOB   Standing balance support: During functional activity;Single extremity supported Standing balance-Leahy Scale: Fair Standing balance comment: fair/good using SPC                             Pertinent Vitals/Pain Pain Assessment: No/denies pain    Home Living Family/patient expects to be discharged to:: Private residence Living Arrangements: Spouse/significant other;Other relatives Available Help at Discharge: Family;Available 24 hours/day Type of Home: House Home Access: Ramped entrance     Home Layout: One level Home Equipment: Walker - 2 wheels;Cane - single point;Bedside commode;Shower seat;Hospital bed      Prior Function Level of Independence: Independent with assistive device(s)         Comments: Household and short distanced community ambulator using [place]  Dominant Hand: Left    Extremity/Trunk Assessment   Upper Extremity Assessment Upper Extremity Assessment: Overall WFL for tasks assessed    Lower Extremity Assessment Lower Extremity Assessment: Overall WFL for tasks assessed    Cervical  / Trunk Assessment Cervical / Trunk Assessment: Normal  Communication   Communication: No difficulties  Cognition Arousal/Alertness: Awake/alert Behavior During Therapy: WFL for tasks assessed/performed Overall Cognitive Status: Within Functional Limits for tasks assessed                                        General Comments      Exercises     Assessment/Plan    PT Assessment All further PT needs can be met in the next venue of care  PT Problem List Decreased strength;Decreased activity tolerance;Decreased balance;Decreased mobility       PT Treatment Interventions      PT Goals (Current goals can be found in the Care Plan section)  Acute Rehab PT Goals Patient Stated Goal: return home with family to assist PT Goal Formulation: With patient Time For Goal Achievement: 03/08/20 Potential to Achieve Goals: Good    Frequency     Barriers to discharge        Co-evaluation               AM-PAC PT "6 Clicks" Mobility  Outcome Measure Help needed turning from your back to your side while in a flat bed without using bedrails?: None Help needed moving from lying on your back to sitting on the side of a flat bed without using bedrails?: A Little Help needed moving to and from a bed to a chair (including a wheelchair)?: A Little Help needed standing up from a chair using your arms (e.g., wheelchair or bedside chair)?: None Help needed to walk in hospital room?: A Little Help needed climbing 3-5 steps with a railing? : A Lot 6 Click Score: 19    End of Session Equipment Utilized During Treatment: Oxygen Activity Tolerance: Patient tolerated treatment well;Patient limited by fatigue Patient left: in chair;with call bell/phone within reach Nurse Communication: Mobility status PT Visit Diagnosis: Unsteadiness on feet (R26.81);Other abnormalities of gait and mobility (R26.89);Muscle weakness (generalized) (M62.81)    Time: 6578-4696 PT Time  Calculation (min) (ACUTE ONLY): 30 min   Charges:   PT Evaluation $PT Eval Moderate Complexity: 1 Mod PT Treatments $Therapeutic Activity: 23-37 mins        11:13 AM, 03/08/20 Lonell Grandchild, MPT Physical Therapist with [facility] [phone] office [phone] mobile phone

## 2020-03-08 NOTE — Discharge Summary (Signed)
Physician Discharge Summary  Mark Schroeder VHQ:[id number] DOB: 06/29/1945 DOA: 03/05/2020  [facility]  Admit date: 03/05/2020 Discharge date: 03/08/2020  Admitted From: Home Disposition:  Home   Recommendations for Outpatient Follow-up:  1. Follow up with PCP in 1-2 weeks 2. Please obtain BMP/CBC in one week   Home Health: HHPT Equipment/Devices: 2L [place]  Discharge Condition: Stable CODE STATUS: FULL Diet recommendation: Heart Healthy   Brief/Interim Summary: 75 year old male with history of COPD, diabetes mellitus type 2, PTSD, and peripheral neuropathy presenting with 4-day history of shortness of breath, fevers, coughing. He quit smoking about 10 to 15 years ago. He denied any chest pain, headache hemoptysis, nausea, vomiting, but did endorse some loose stools over the past 3 to 4 days. He denies any hematochezia, melena, dysuria, hematuria. In addition, the patient is complaining of some generalized weakness. In the emergency department, the patient was febrile to 101.3 F. He was hemodynamically stable with oxygen saturation of 75% on room air. He was placed on 4 L with saturation 96-97%. The patient has been vaccinated for COVID-19, but had not received his booster. CTA chest was negative for PE but showed scattered groundglass opacities bilateral. BMP showed a potassium of 2.5 and serum creatinine 1.15. LFTs were unremarkable. WBC 5.9, hemoglobin 14.2, platelets 269,000. The patient was started on remdesivir and IV steroids. He also received a dose of Actemra in ED.  Discharge Diagnoses:  Acute respiratory failure with hypoxia secondary COVID-19 -Presented with oxygen saturation 75% on room air -Currently stable on 4 L>>3L -CRP 11.0>>4.9 -D-dimer 0.78>>1.03>>1.12 -Ferritin 344>>232 -PCT<0.10 -Continue remdesivir and IV steroids -received 4 days remdesivir during hospitalization -Vitamin C and zinc -Prone positioning as much as  tolerated -03/05/2020 CTA chest--negative PE, scattered groundglass opacities bilateral, biapical scarring, retained mucus in the left mainstem and distal mucous plugging in theLLs -d/c home with prednisone x 7 days  COPD exacerbation -Patient still has some mild wheezing on exam -03/05/2020 ABG 7.3 6/58/90/30 on 4 L>>2L -Continue IV steroids>>home with prednisone -Continue Combivent -ambulatory pulse on day of d/c showed desaturation <88% -d/c home with 2L   Acute metabolic encephalopathy -The patient was initially lethargic in the emergency department -03/06/2020--A&O x 4 -Obtain UA --no pyuria -Check B12--719 -Folic acid--19.5 -TSH--0.050; Free T4--0.98 -Holding gabapentin andsinequantemporarily -mental status improved, back to baseline  DM2 -Does not appear the patient was on any agents in the outpatient setting -NovoLog sliding scale -Hemoglobin A1c--6.7  Essential hypertension -Restart carvedilol--dose increased to 25 mg bid -add spironolactone, which will also help with his recurrent hypokalemia  Hypokalemia -Replete -Magnesium 2.1 -he was requiring large amounts of KCl to correct (up to 100 mEQ po daily) to improve which may be partly due to cellular shifts from his frequent insulin SS. -d/c home with spironolactone which will also help with his elevated BPs  Hypophosphatemia -repleted with K-Phos  R-BKA -PT eval>>HHPT    Discharge Instructions   Allergies as of 03/08/2020   No Known Allergies     Medication List    STOP taking these medications   Dexlansoprazole 30 MG capsule     TAKE these medications   acetaminophen 325 MG tablet Commonly known as: TYLENOL Take 2 tablets (650 mg total) by mouth every 6 (six) hours as needed for mild pain (or Fever >/= 101).   albuterol (2.5 MG/3ML) 0.083% nebulizer solution Commonly known as: PROVENTIL Take 2.5 mg by nebulization 3 (three) times daily as needed for wheezing or shortness of  breath.  albuterol 108 (90 Base) MCG/ACT inhaler Commonly known as: VENTOLIN HFA Inhale 2 puffs into the lungs 4 (four) times daily.   aspirin EC 81 MG tablet Take 1 tablet (81 mg total) by mouth daily with breakfast.   budesonide-formoterol 160-4.5 MCG/ACT inhaler Commonly known as: SYMBICORT Inhale 2 puffs into the lungs 2 (two) times daily.   buPROPion 150 MG 12 hr tablet Commonly known as: ZYBAN Take 150 mg by mouth 2 (two) times daily.   carboxymethylcellulose 0.5 % Soln Commonly known as: REFRESH PLUS 1 drop 4 (four) times daily as needed (for dry eyes).   carvedilol 25 MG tablet Commonly known as: COREG Take 1 tablet (25 mg total) by mouth 2 (two) times daily with a meal. What changed:   medication strength  how much to take  when to take this   diclofenac Sodium 1 % Gel Commonly known as: VOLTAREN Apply 4 g topically 3 (three) times daily as needed (for pain).   divalproex 250 MG 24 hr tablet Commonly known as: DEPAKOTE ER Take 250 mg by mouth daily.   doxepin 50 MG capsule Commonly known as: SINEQUAN Take 100 mg by mouth at bedtime.   famotidine 40 MG tablet Commonly known as: PEPCID Take 40 mg by mouth 2 (two) times daily.   gabapentin 300 MG capsule Commonly known as: NEURONTIN Take 600 mg by mouth in the morning and at bedtime.   lactobacillus acidophilus Tabs tablet Take 1 tablet by mouth 2 (two) times daily.   loratadine 10 MG tablet Commonly known as: CLARITIN Take 10 mg by mouth daily.   melatonin 5 MG Tabs Take 10 mg by mouth at bedtime.   potassium chloride SA 20 MEQ tablet Commonly known as: KLOR-CON Take 1 tablet (20 mEq total) by mouth 2 (two) times daily.   predniSONE 50 MG tablet Commonly known as: DELTASONE Take 1 tablet (50 mg total) by mouth 2 (two) times daily with a meal.   sildenafil 100 MG tablet Commonly known as: VIAGRA Take 100 mg by mouth daily as needed for erectile dysfunction. Do not take within 6 hours of  Terazosin, Prazosin, or Doxazosin.   spironolactone 25 MG tablet Commonly known as: ALDACTONE Take 1 tablet (25 mg total) by mouth daily.   tamsulosin 0.4 MG Caps capsule Commonly known as: FLOMAX Take 0.4 mg by mouth daily.   tiotropium 18 MCG inhalation capsule Commonly known as: SPIRIVA Place 1 capsule (18 mcg total) into inhaler and inhale daily.   triamcinolone 0.025 % cream Commonly known as: KENALOG Apply 1 application topically 2 (two) times daily.   VITAMIN D PO Take 1,000 capsules by mouth daily.            Durable Medical Equipment  (From admission, onward)         Start     Ordered   03/08/20 1154  For home use only DME oxygen  Once       Question Answer Comment  Length of Need 6 Months   Liters per Minute 2   Frequency Continuous (stationary and portable oxygen unit needed)   Oxygen conserving device Yes   Oxygen delivery system Gas      03/08/20 1153   03/07/20 1454  For home use only DME oxygen  Once       Question Answer Comment  Length of Need 6 Months   Mode or (Route) Nasal cannula   Liters per Minute 3   Frequency Continuous (stationary and portable oxygen unit  needed)   Oxygen conserving device Yes   Oxygen delivery system Gas      03/07/20 1453          No Known Allergies  Consultations:  none   Procedures/Studies: CT Head Wo Contrast  Result Date: 03/05/2020 CLINICAL DATA:  Delirium EXAM: CT HEAD WITHOUT CONTRAST TECHNIQUE: Contiguous axial images were obtained from the base of the skull through the vertex without intravenous contrast. COMPARISON:  09/21/2019 FINDINGS: Brain: There is no acute intracranial hemorrhage, mass effect, or edema. Gray-white differentiation is preserved. There is no extra-axial fluid collection. Prominence of the ventricles and sulci reflects stable parenchymal volume loss. Patchy hypoattenuation in the supratentorial white matter is nonspecific but probably reflects stable chronic microvascular  ischemic changes. Vascular: There is atherosclerotic calcification at the skull base. Skull: Calvarium is unremarkable. Sinuses/Orbits: Paranasal sinus mucosal thickening with opacification of the right sphenoid. Orbits are unremarkable. Other: Mastoid air cells are clear. IMPRESSION: No acute intracranial abnormality. Stable chronic microvascular ischemic changes. Nonspecific right sphenoid sinus opacification, which could reflect acute sinusitis in the appropriate setting. Electronically Signed   By: Guadlupe Spanish M.D.   On: 03/05/2020 18:24   CT ANGIO CHEST PE W OR WO CONTRAST  Result Date: 03/05/2020 CLINICAL DATA:  Acute respiratory failure on 4 L. COVID +ve. Negative chest xray. EXAM: CT ANGIOGRAPHY CHEST WITH CONTRAST TECHNIQUE: Multidetector CT imaging of the chest was performed using the standard protocol during bolus administration of intravenous contrast. Multiplanar CT image reconstructions and MIPs were obtained to evaluate the vascular anatomy. CONTRAST:  OMNIPAQUE IOHEXOL 350 MG/ML SOLN COMPARISON:  Radiograph earlier today.  Chest CT 08/15/2018 FINDINGS: Cardiovascular: There are no filling defects within the pulmonary arteries to suggest pulmonary embolus. Basilar assessment is slightly limited by breathing motion artifact. Moderate to advanced aortic atherosclerosis with calcified and noncalcified plaque. Conventional branching from the aortic arch with branch atherosclerosis. Calcified plaque at the origin of the left common carotid artery causes approximately 50% luminal stenosis. Coronary artery calcifications, normal heart size. No pericardial effusion. Mediastinum/Nodes: 11 mm right hilar node, unchanged from prior exam. Scattered small mediastinal lymph nodes are not enlarged by size criteria. There is no thyroid nodule. No esophageal wall thickening. Small hiatal hernia. Lungs/Pleura: Moderate emphysema. Scattered areas of ground-glass opacity in the perifissural upper lobes,  paramediastinal and dependent right lower lobe, and to a lesser extent right middle lobe and lingula. Right upper lobe nodule measures 6 mm, previously 7 mm, series 6, image 52. Adjacent architectural distortion and bandlike opacity typical of scarring. Mild biapical pleuroparenchymal scarring. Central bronchial thickening again seen. Retained mucus in the left mainstem bronchus with scattered areas of distal mucous plugging in the lower lobes. No pleural fluid. Upper Abdomen: No acute findings. Small cysts in the upper right kidney. Musculoskeletal: Thoracic spondylosis. There are no acute or suspicious osseous abnormalities. Review of the MIP images confirms the above findings. IMPRESSION: 1. No pulmonary embolus. 2. Scattered areas of ground-glass opacity throughout both lungs, likely related to COVID pneumonia. 3. Right upper lobe nodule measures 6 mm, previously 7 mm. This suggests benign post infectious or inflammatory etiology. Adjacent architectural distortion and bandlike opacity typical of scarring. 4. Moderate emphysema. Central bronchial thickening with retained mucus in the left lower lobe bronchus. 5. Aortic atherosclerosis.  Coronary artery calcifications. Aortic Atherosclerosis (ICD10-I70.0) and Emphysema (ICD10-J43.9). Electronically Signed   By: Narda Rutherford M.D.   On: 03/05/2020 20:18   DG Chest Port 1 View  Result Date: 03/05/2020 CLINICAL DATA:  Shortness of breath, fever, anorexia for 4 days, cough, decreased oxygen saturation of 74% EXAM: PORTABLE CHEST 1 VIEW COMPARISON:  Portable exam 1506 hours compared to 09/21/2019 FINDINGS: Normal heart size, mediastinal contours, and pulmonary vascularity. Lungs clear. Questionable nodular density LEFT lung versus prominent LEFT sixth rib costochondral junction or nipple shadow. No pleural effusion or pneumothorax. Bones demineralized. IMPRESSION: Question LEFT nipple shadow versus nodular density are prominent LEFT sixth costochondral junction;  follow-up upright PA chest radiograph with nipple markers recommended to exclude pulmonary nodule. Electronically Signed   By: Ulyses Southward M.D.   On: 03/05/2020 15:13        Discharge Exam: Vitals:   03/08/20 1200 03/08/20 1401  BP:  (!) 168/77  Pulse:  77  Resp:  19  Temp:  98.5 F (36.9 C)  SpO2: 93% 94%   Vitals:   03/08/20 0612 03/08/20 0747 03/08/20 1200 03/08/20 1401  BP: (!) 153/94   (!) 168/77  Pulse: 80   77  Resp: 19   19  Temp: 97.7 F (36.5 C)   98.5 F (36.9 C)  TempSrc: Oral   Oral  SpO2: 90% 92% 93% 94%  Weight:      Height:        General: Pt is alert, awake, not in acute distress Cardiovascular: RRR, S1/S2 +, no rubs, no gallops Respiratory: diminished BS, bibasilar rales. No wheeze Abdominal: Soft, NT, ND, bowel sounds + Extremities: no edema, no cyanosis   The results of significant diagnostics from this hospitalization (including imaging, microbiology, ancillary and laboratory) are listed below for reference.    Significant Diagnostic Studies: CT Head Wo Contrast  Result Date: 03/05/2020 CLINICAL DATA:  Delirium EXAM: CT HEAD WITHOUT CONTRAST TECHNIQUE: Contiguous axial images were obtained from the base of the skull through the vertex without intravenous contrast. COMPARISON:  09/21/2019 FINDINGS: Brain: There is no acute intracranial hemorrhage, mass effect, or edema. Gray-white differentiation is preserved. There is no extra-axial fluid collection. Prominence of the ventricles and sulci reflects stable parenchymal volume loss. Patchy hypoattenuation in the supratentorial white matter is nonspecific but probably reflects stable chronic microvascular ischemic changes. Vascular: There is atherosclerotic calcification at the skull base. Skull: Calvarium is unremarkable. Sinuses/Orbits: Paranasal sinus mucosal thickening with opacification of the right sphenoid. Orbits are unremarkable. Other: Mastoid air cells are clear. IMPRESSION: No acute intracranial  abnormality. Stable chronic microvascular ischemic changes. Nonspecific right sphenoid sinus opacification, which could reflect acute sinusitis in the appropriate setting. Electronically Signed   By: Guadlupe Spanish M.D.   On: 03/05/2020 18:24   CT ANGIO CHEST PE W OR WO CONTRAST  Result Date: 03/05/2020 CLINICAL DATA:  Acute respiratory failure on 4 L. COVID +ve. Negative chest xray. EXAM: CT ANGIOGRAPHY CHEST WITH CONTRAST TECHNIQUE: Multidetector CT imaging of the chest was performed using the standard protocol during bolus administration of intravenous contrast. Multiplanar CT image reconstructions and MIPs were obtained to evaluate the vascular anatomy. CONTRAST:  OMNIPAQUE IOHEXOL 350 MG/ML SOLN COMPARISON:  Radiograph earlier today.  Chest CT 08/15/2018 FINDINGS: Cardiovascular: There are no filling defects within the pulmonary arteries to suggest pulmonary embolus. Basilar assessment is slightly limited by breathing motion artifact. Moderate to advanced aortic atherosclerosis with calcified and noncalcified plaque. Conventional branching from the aortic arch with branch atherosclerosis. Calcified plaque at the origin of the left common carotid artery causes approximately 50% luminal stenosis. Coronary artery calcifications, normal heart size. No pericardial effusion. Mediastinum/Nodes: 11 mm right hilar node, unchanged from prior exam. Scattered  small mediastinal lymph nodes are not enlarged by size criteria. There is no thyroid nodule. No esophageal wall thickening. Small hiatal hernia. Lungs/Pleura: Moderate emphysema. Scattered areas of ground-glass opacity in the perifissural upper lobes, paramediastinal and dependent right lower lobe, and to a lesser extent right middle lobe and lingula. Right upper lobe nodule measures 6 mm, previously 7 mm, series 6, image 52. Adjacent architectural distortion and bandlike opacity typical of scarring. Mild biapical pleuroparenchymal scarring. Central  bronchial thickening again seen. Retained mucus in the left mainstem bronchus with scattered areas of distal mucous plugging in the lower lobes. No pleural fluid. Upper Abdomen: No acute findings. Small cysts in the upper right kidney. Musculoskeletal: Thoracic spondylosis. There are no acute or suspicious osseous abnormalities. Review of the MIP images confirms the above findings. IMPRESSION: 1. No pulmonary embolus. 2. Scattered areas of ground-glass opacity throughout both lungs, likely related to COVID pneumonia. 3. Right upper lobe nodule measures 6 mm, previously 7 mm. This suggests benign post infectious or inflammatory etiology. Adjacent architectural distortion and bandlike opacity typical of scarring. 4. Moderate emphysema. Central bronchial thickening with retained mucus in the left lower lobe bronchus. 5. Aortic atherosclerosis.  Coronary artery calcifications. Aortic Atherosclerosis (ICD10-I70.0) and Emphysema (ICD10-J43.9). Electronically Signed   By: Narda Rutherford M.D.   On: 03/05/2020 20:18   DG Chest Port 1 View  Result Date: 03/05/2020 CLINICAL DATA:  Shortness of breath, fever, anorexia for 4 days, cough, decreased oxygen saturation of 74% EXAM: PORTABLE CHEST 1 VIEW COMPARISON:  Portable exam 1506 hours compared to 09/21/2019 FINDINGS: Normal heart size, mediastinal contours, and pulmonary vascularity. Lungs clear. Questionable nodular density LEFT lung versus prominent LEFT sixth rib costochondral junction or nipple shadow. No pleural effusion or pneumothorax. Bones demineralized. IMPRESSION: Question LEFT nipple shadow versus nodular density are prominent LEFT sixth costochondral junction; follow-up upright PA chest radiograph with nipple markers recommended to exclude pulmonary nodule. Electronically Signed   By: Ulyses Southward M.D.   On: 03/05/2020 15:13     Microbiology: Recent Results (from the past 240 hour(s))  Blood Culture (routine x 2)     Status: None (Preliminary result)    Collection Time: 03/05/20  2:45 PM   Specimen: BLOOD LEFT HAND  Result Value Ref Range Status   Specimen Description BLOOD LEFT HAND  Final   Special Requests   Final    BOTTLES DRAWN AEROBIC AND ANAEROBIC Blood Culture adequate volume   Culture   Final    NO GROWTH 3 DAYS Performed at [facility], [address], [place], [place] [address]    Report Status PENDING  Incomplete  Blood Culture (routine x 2)     Status: None (Preliminary result)   Collection Time: 03/05/20  2:45 PM   Specimen: BLOOD  Result Value Ref Range Status   Specimen Description BLOOD RIGHT ANTECUBITAL  Final   Special Requests   Final    BOTTLES DRAWN AEROBIC AND ANAEROBIC Blood Culture adequate volume   Culture   Final    NO GROWTH 3 DAYS Performed at [facility], [address], [place], [place] [address]    Report Status PENDING  Incomplete  Culture, Urine     Status: Abnormal   Collection Time: 03/06/20  7:25 AM   Specimen: Urine, Clean Catch  Result Value Ref Range Status   Specimen Description   Final    URINE, CLEAN CATCH Performed at [facility], [address] [place], [place] [address]    Special Requests  Final    NONE Performed at [facility], [address], [place], [place] [address]    Culture MULTIPLE SPECIES PRESENT, SUGGEST RECOLLECTION (A)  Final   Report Status 03/08/2020 FINAL  Final     Labs: Basic Metabolic Panel: Recent Labs  Lab 03/05/20 1445 03/05/20 1702 03/06/20 0538 03/07/20 0421 03/08/20 0701 03/08/20 1508  NA 139  --  141 144 143 142  K 2.5*  --  3.0* 2.5* 2.5* 3.0*  CL 91*  --  100 100 101 100  CO2 34*  --  31 31 29 31   GLUCOSE 109*  --  160* 170* 187* 140*  BUN 15  --  16 20 18 18   CREATININE 1.15  --  0.76 0.73 0.69 0.87  CALCIUM 8.9  --  7.8* 8.4* 8.4* 8.4*  MG  --  2.5* 2.1 1.9 1.8  --   PHOS  --   --  2.4* 2.9 2.9  --    Liver Function Tests: Recent Labs  Lab 03/05/20 1445 03/06/20 0538 03/07/20 0421 03/08/20 0701  AST 50* 36 38 34  ALT 23  18 20 21   ALKPHOS 40 28* 31* 33*  BILITOT 1.2 1.0 0.8 0.8  PROT 8.3* 6.3* 6.8 6.8  ALBUMIN 3.7 2.7* 2.9* 3.1*   No results for input(s): LIPASE, AMYLASE in the last 168 hours. No results for input(s): AMMONIA in the last 168 hours. CBC: Recent Labs  Lab 03/05/20 1445 03/06/20 0538 03/07/20 0421 03/08/20 0701  WBC 5.9 2.9* 6.2 8.9  NEUTROABS 4.2 2.1 5.1 7.8*  HGB 14.2 12.8* 12.9* 13.3  HCT 45.3 41.3 40.7 41.9  MCV 91.1 93.7 91.1 89.5  PLT 269 221 257 272   Cardiac Enzymes: No results for input(s): CKTOTAL, CKMB, CKMBINDEX, TROPONINI in the last 168 hours. BNP: Invalid input(s): POCBNP CBG: Recent Labs  Lab 03/07/20 1147 03/07/20 1609 03/08/20 0258 03/08/20 0729 03/08/20 1123  GLUCAP 157* 147* 152* 162* 238*    Time coordinating discharge:  36 minutes  Signed:  03/10/20, DO [facility] Pager: [phone] 03/08/2020, 4:09 PM

## 2020-03-10 LAB — CULTURE, BLOOD (ROUTINE X 2)
Culture: NO GROWTH
Culture: NO GROWTH
Special Requests: ADEQUATE
Special Requests: ADEQUATE

## 2020-03-11 LAB — GLUCOSE, CAPILLARY: Glucose-Capillary: 149 mg/dL — ABNORMAL HIGH (ref 70–99)

## 2022-03-02 IMAGING — DX DG CHEST 1V PORT
1 series · 1 of 1 positions shown · non-contrast
Comparison: Chest radiograph 04/13/2019

CLINICAL DATA: Shortness of breath

EXAM:
PORTABLE CHEST 1 VIEW

[chest ap]
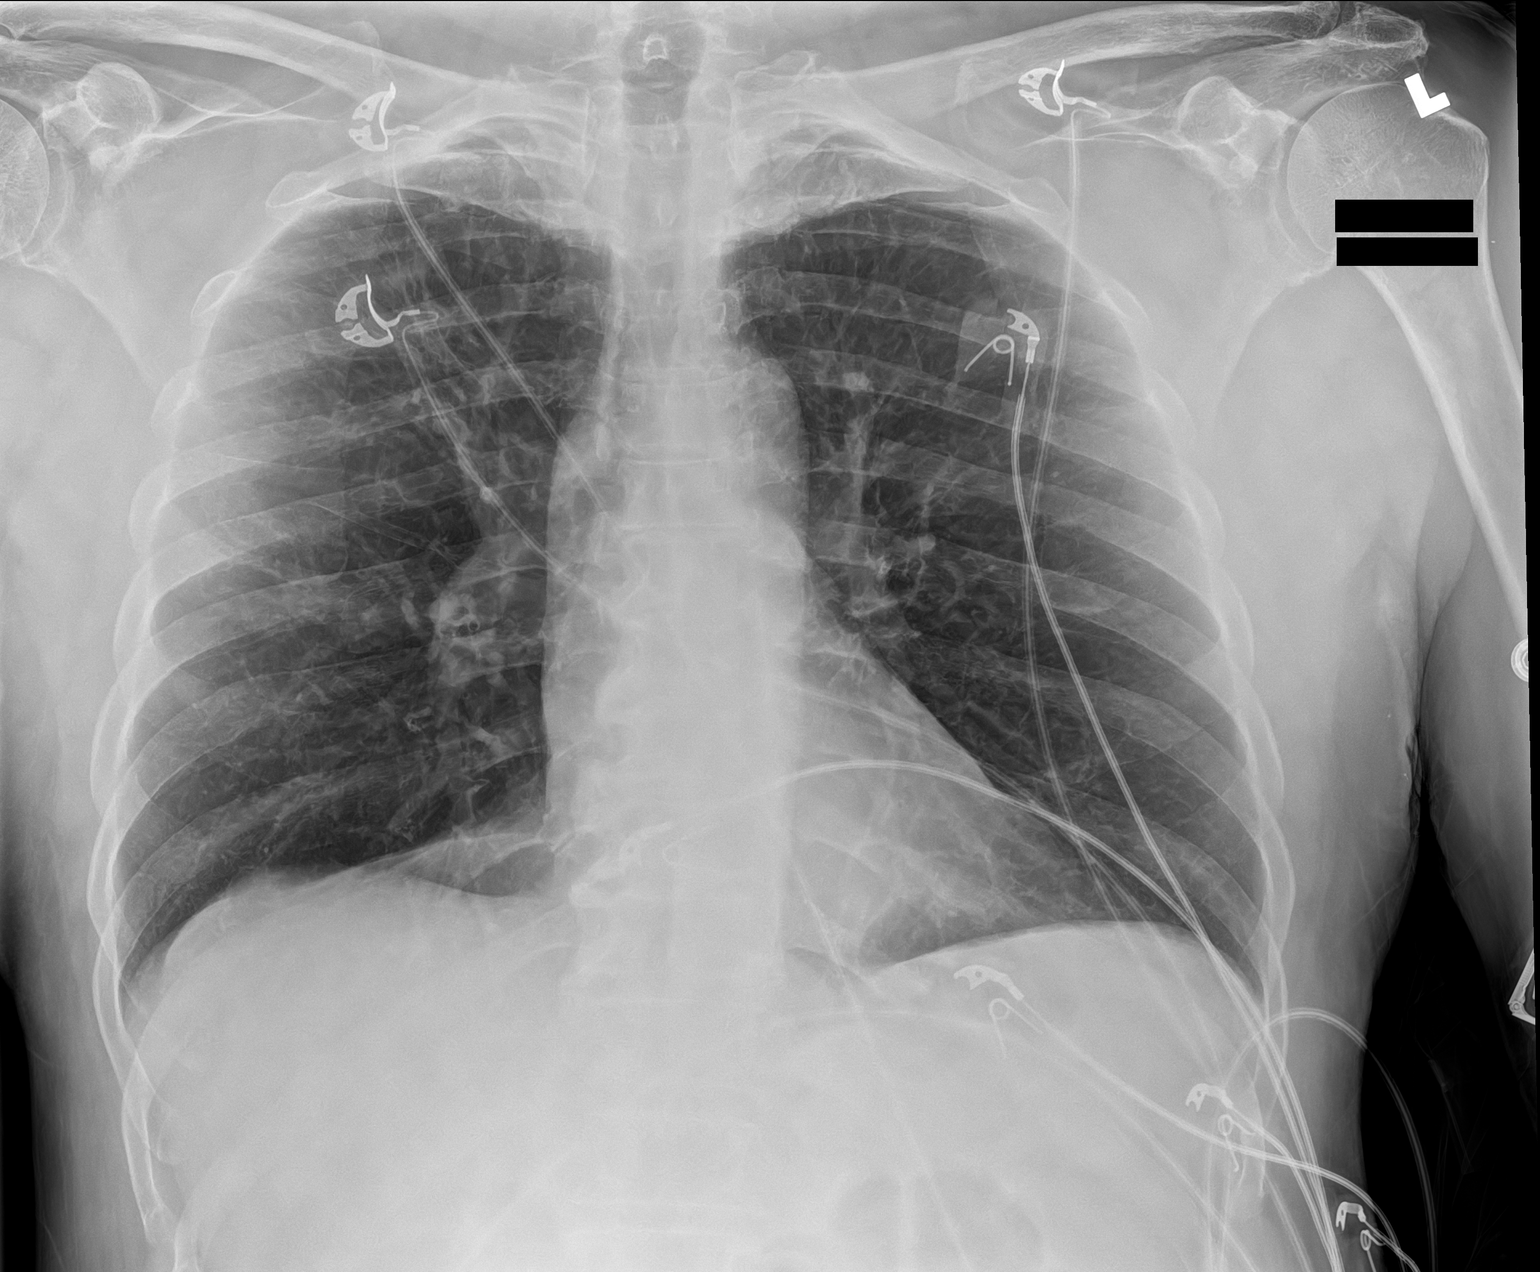

[1 of 1 positions shown; findings below may reference images not displayed]

FINDINGS: There is mild residual scarring in the right upper lobe. No acute
airspace disease. No pleural effusion or pneumothorax.
IMPRESSION: Mild residual scarring in the right upper lobe.

## 2022-03-02 IMAGING — CT CT ANGIO CHEST
2 of 6 series · 17 of 46 positions shown · IV contrast (Omnipaque or Isovue)
Comparison: Radiographs 08/15/2019, 04/13/2019, chest CTA
08/12/2010 (report only, images unavailable)

CLINICAL DATA: Fever for 3 days, elevated D-dimer

EXAM:
CT ANGIOGRAPHY CHEST WITH CONTRAST
TECHNIQUE: Multidetector CT imaging of the chest was performed using the
standard protocol during bolus administration of intravenous
contrast. Multiplanar CT image reconstructions and MIPs were
obtained to evaluate the vascular anatomy.
CONTRAST:  100mL OMNIPAQUE IOHEXOL 350 MG/ML SOLN

[Series 6: pe axial thins · axial · 0.74mm/px · z∈[-473,-193]mm · 14 of 308 slices shown]
[im 14/308  lung]
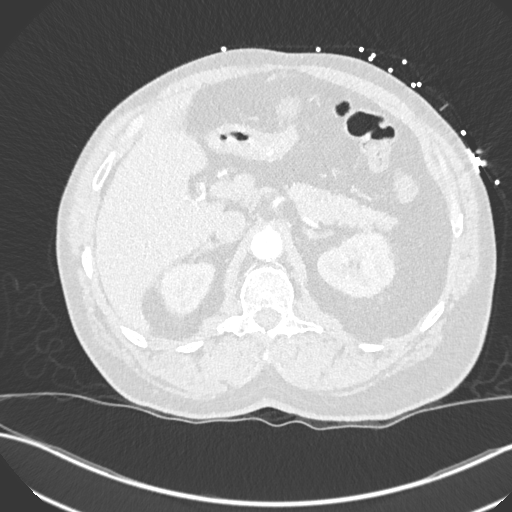
[im 41/308  soft-tissue]
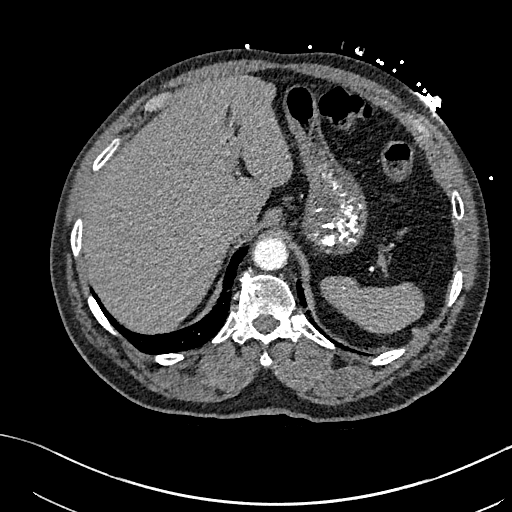
[im 54/308  lung]
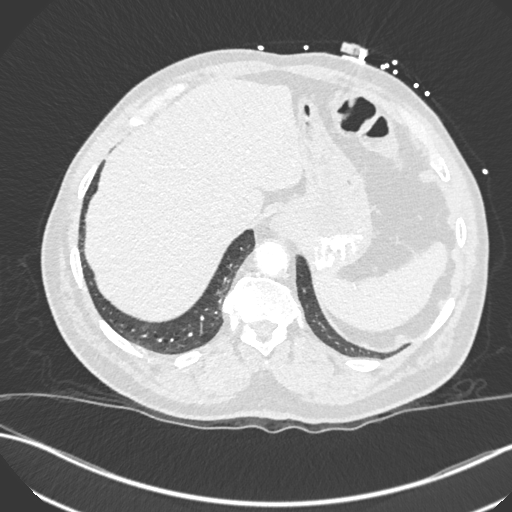
[im 81/308  soft-tissue]
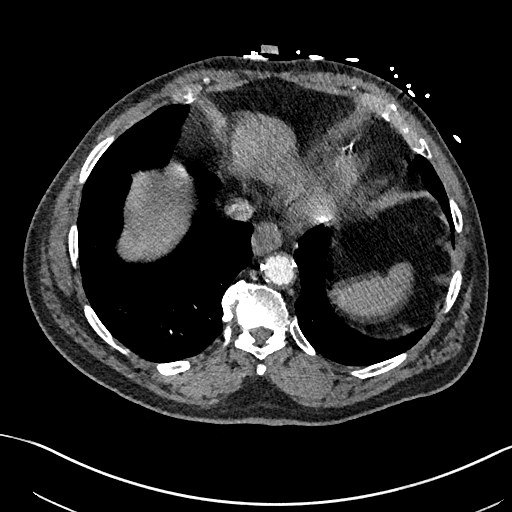
[im 107/308  lung]
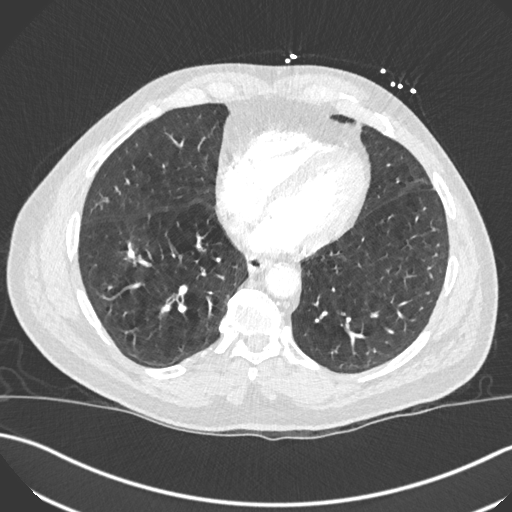
[im 121/308  soft-tissue]
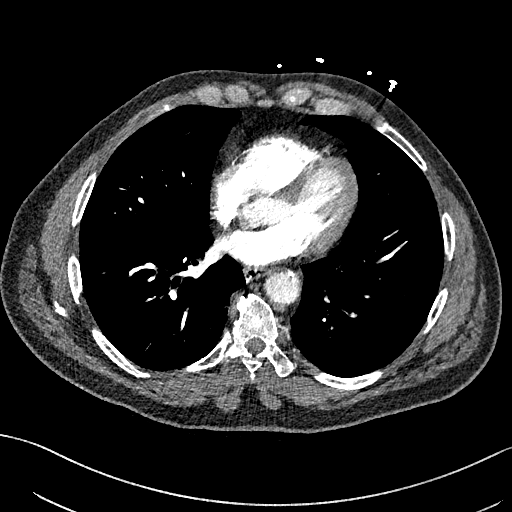
[im 147/308  lung]
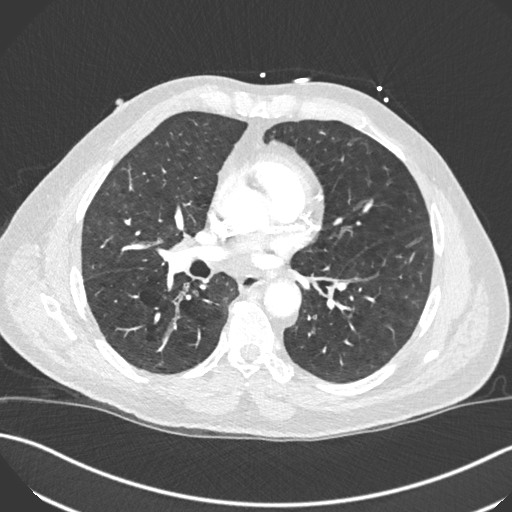
[im 161/308  soft-tissue]
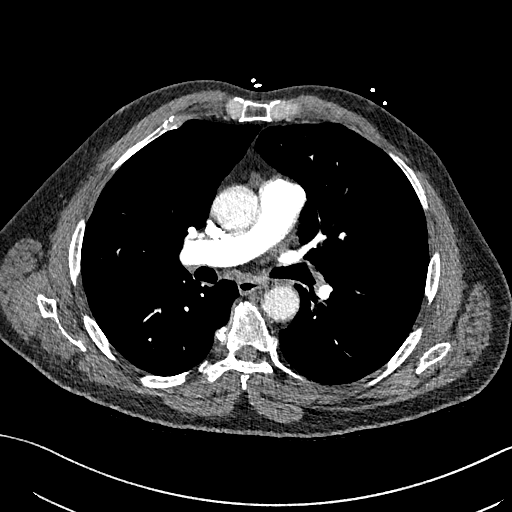
[im 187/308  lung]
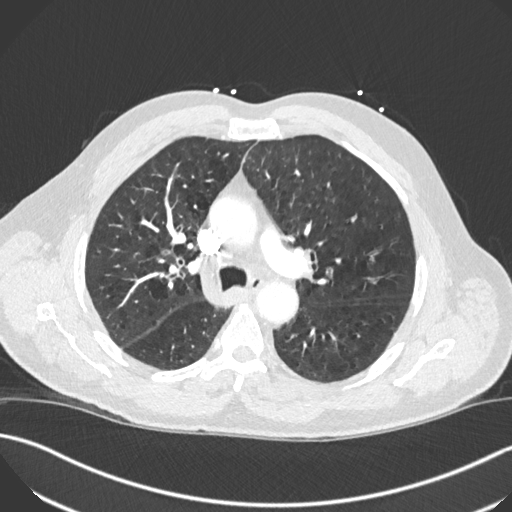
[im 201/308  soft-tissue]
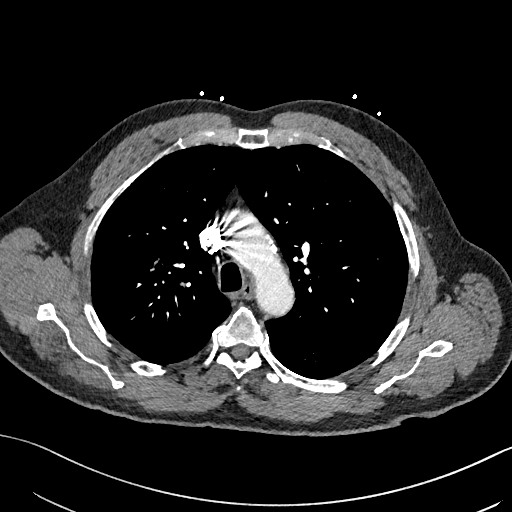
[im 227/308  lung]
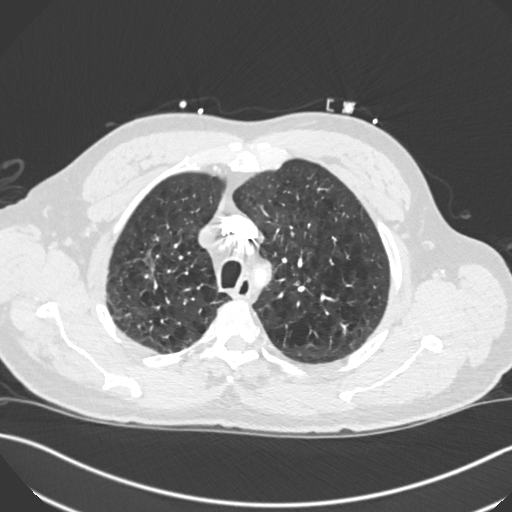
[im 254/308  soft-tissue]
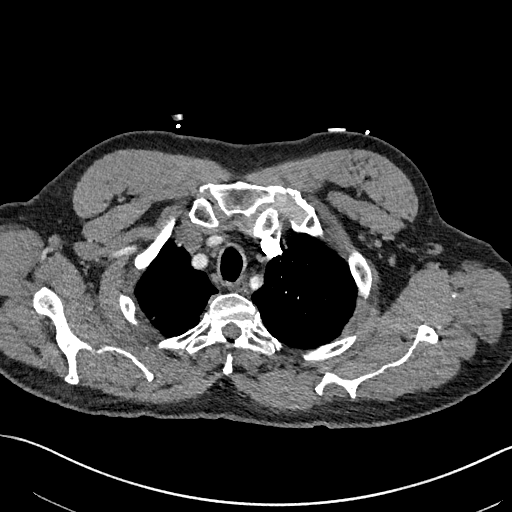
[im 267/308  lung]
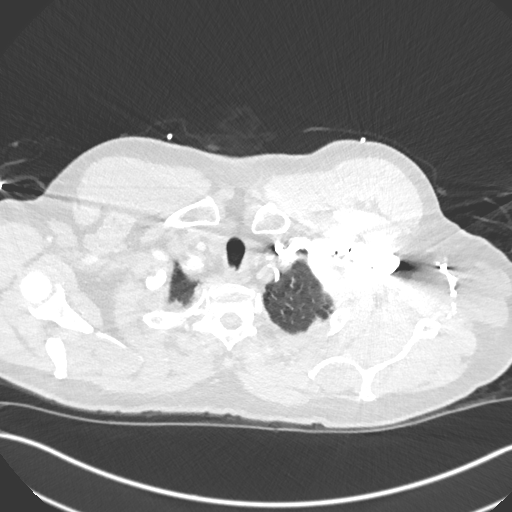
[im 294/308  soft-tissue]
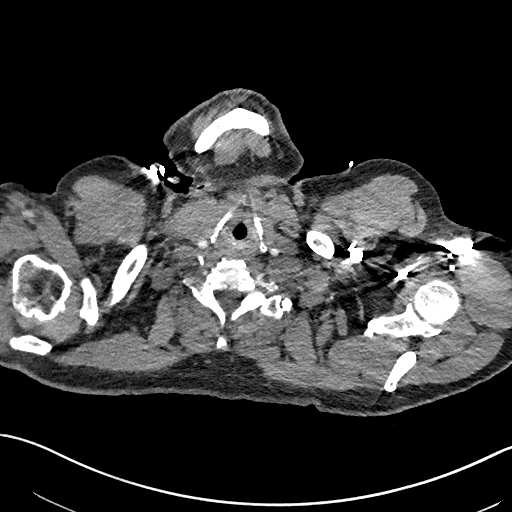

[Series 8: cor soft · coronal · 0.65mm/px · 3 of 151 slices shown]
[im 38/151  soft-tissue]
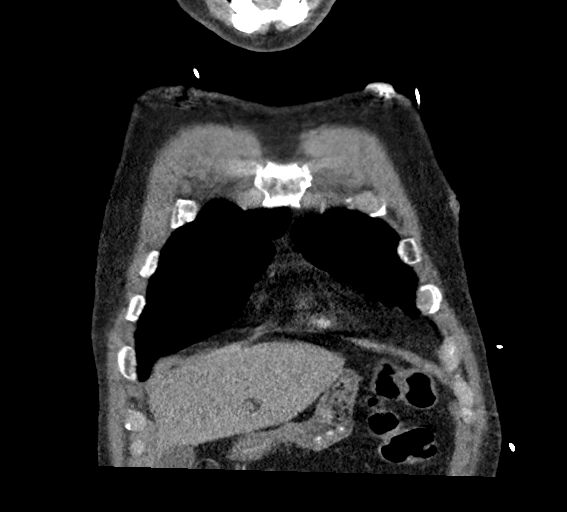
[im 76/151  soft-tissue]
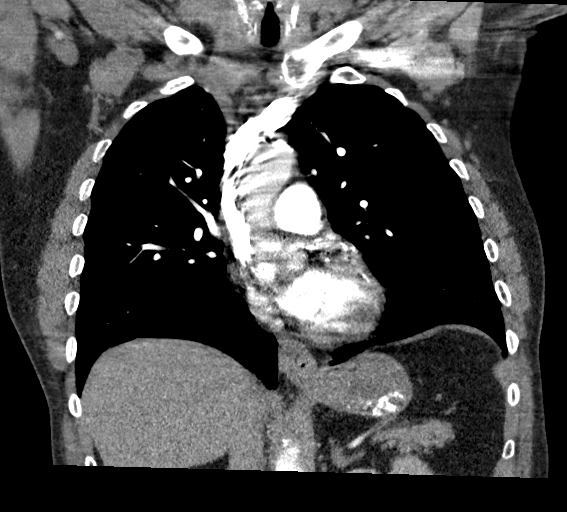
[im 113/151  soft-tissue]
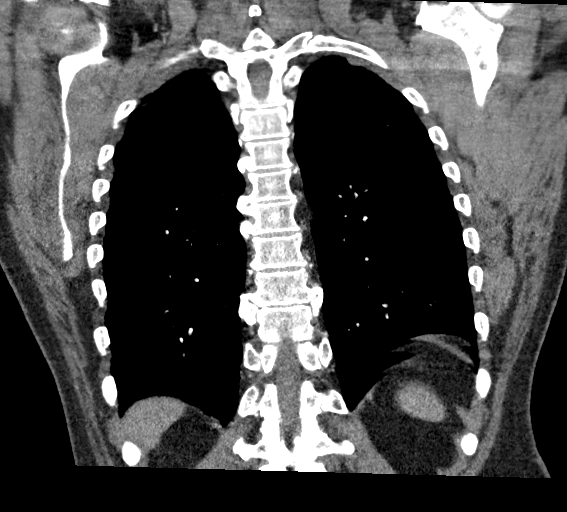

[17 of 46 positions shown; findings below may reference images not displayed]

FINDINGS: Cardiovascular: Satisfactory opacification the pulmonary arteries to
the segmental level. No pulmonary artery filling defects are
identified. Central pulmonary arteries are normal caliber.
Atherosclerotic plaque within the normal caliber aorta. Shared
origin of brachiocephalic and left common carotid artery, the origin
of which is poorly visualized due to streak artifact from adjacent
contrast bolus in the brachiocephalic vein. Minimal plaque elsewhere
in the proximal great vessels. Normal heart size. No pericardial
effusion. Coronary artery calcifications are present.

Mediastinum/Nodes: No mediastinal fluid or gas. Normal thyroid gland
and thoracic inlet. No acute abnormality of the trachea. Small
hiatal hernia. Esophagus is otherwise unremarkable. No worrisome
mediastinal, hilar or axillary adenopathy.

Lungs/Pleura: There is diffuse centrilobular and paraseptal
emphysematous change throughout the lungs. Some right apical
pleuroparenchymal scarring is noted. There is 1.7 cm solid nodule in
the right upper lobe with surrounding reticular changes and
architectural distortion as well as adjacent bronchiectatic
features. While this could reflect a post infectious or
postinflammatory finding. Underlying malignant nodule is not fully
excluded. No other suspicious nodules or masses. Some right apical
pleuroparenchymal scarring is likely present on comparison imaging
as remote is 0727. Diffusely, there is mild airways thickening and
scattered secretions. No pneumothorax or visible effusion.

Upper Abdomen: No acute abnormalities present in the visualized
portions of the upper abdomen.

Musculoskeletal: Multilevel degenerative changes are present in the
imaged portions of the spine and shoulders. No chest wall mass or
suspicious bone lesions identified.

Review of the MIP images confirms the above findings.
IMPRESSION: 1. No evidence of acute pulmonary artery filling defects.
2. Diffuse mild airways thickening could reflect some acute
bronchitic change or sequela of more chronic bronchitis/smoking
related airways disease on a background of emphysema. ( Emphysema
(WFD3K-BVL.P).)
3. 1.7 cm solid nodule in the right upper lobe with surrounding
reticular changes and architectural distortion as well as adjacent
bronchiectatic features. While this could reflect a post infectious
or postinflammatory given more diffuse consolidative opacity in this
location on prior, underlying malignant pulmonary nodule is not
fully excluded. Consider pulmonology consultation and 3 month
follow-up CT imaging as PET-CT may be complicated by background of
inflammation. This recommendation follows the consensus statement:
Guidelines for Management of Incidental Pulmonary Nodules Detected
4. Small hiatal hernia.
5. Aortic Atherosclerosis (WFD3K-G85.5).

## 2023-04-16 ENCOUNTER — Inpatient Hospital Stay (HOSPITAL_COMMUNITY)
Admission: EM | Admit: 2023-04-16 | Discharge: 2023-04-18 | DRG: 193 | Disposition: A | Discharge status: Home / Self Care | Payer: No Typology Code available for payment source | Attending: Internal Medicine | Admitting: Internal Medicine

## 2023-04-16 ENCOUNTER — Other Ambulatory Visit: Payer: Self-pay

## 2023-04-16 ENCOUNTER — Emergency Department (HOSPITAL_COMMUNITY): Payer: No Typology Code available for payment source

## 2023-04-16 ENCOUNTER — Encounter (HOSPITAL_COMMUNITY): Payer: Self-pay

## 2023-04-16 DIAGNOSIS — Z79899 Other long term (current) drug therapy: Secondary | ICD-10-CM | POA: Diagnosis not present

## 2023-04-16 DIAGNOSIS — Z1152 Encounter for screening for COVID-19: Secondary | ICD-10-CM | POA: Diagnosis not present

## 2023-04-16 DIAGNOSIS — Z87891 Personal history of nicotine dependence: Secondary | ICD-10-CM

## 2023-04-16 DIAGNOSIS — J9601 Acute respiratory failure with hypoxia: Secondary | ICD-10-CM

## 2023-04-16 DIAGNOSIS — J441 Chronic obstructive pulmonary disease with (acute) exacerbation: Secondary | ICD-10-CM | POA: Diagnosis present

## 2023-04-16 DIAGNOSIS — J1000 Influenza due to other identified influenza virus with unspecified type of pneumonia: Principal | ICD-10-CM | POA: Diagnosis present

## 2023-04-16 DIAGNOSIS — E876 Hypokalemia: Secondary | ICD-10-CM | POA: Diagnosis present

## 2023-04-16 DIAGNOSIS — Z7982 Long term (current) use of aspirin: Secondary | ICD-10-CM

## 2023-04-16 DIAGNOSIS — E1142 Type 2 diabetes mellitus with diabetic polyneuropathy: Secondary | ICD-10-CM | POA: Diagnosis present

## 2023-04-16 DIAGNOSIS — J101 Influenza due to other identified influenza virus with other respiratory manifestations: Principal | ICD-10-CM

## 2023-04-16 DIAGNOSIS — R0902 Hypoxemia: Secondary | ICD-10-CM

## 2023-04-16 DIAGNOSIS — Z7952 Long term (current) use of systemic steroids: Secondary | ICD-10-CM | POA: Diagnosis not present

## 2023-04-16 DIAGNOSIS — E785 Hyperlipidemia, unspecified: Secondary | ICD-10-CM | POA: Diagnosis present

## 2023-04-16 DIAGNOSIS — F4310 Post-traumatic stress disorder, unspecified: Secondary | ICD-10-CM | POA: Diagnosis present

## 2023-04-16 DIAGNOSIS — J440 Chronic obstructive pulmonary disease with acute lower respiratory infection: Secondary | ICD-10-CM | POA: Diagnosis present

## 2023-04-16 DIAGNOSIS — Z8249 Family history of ischemic heart disease and other diseases of the circulatory system: Secondary | ICD-10-CM | POA: Diagnosis not present

## 2023-04-16 DIAGNOSIS — I10 Essential (primary) hypertension: Secondary | ICD-10-CM | POA: Diagnosis present

## 2023-04-16 DIAGNOSIS — Z7951 Long term (current) use of inhaled steroids: Secondary | ICD-10-CM | POA: Diagnosis not present

## 2023-04-16 DIAGNOSIS — J9691 Respiratory failure, unspecified with hypoxia: Secondary | ICD-10-CM | POA: Diagnosis present

## 2023-04-16 DIAGNOSIS — Z8709 Personal history of other diseases of the respiratory system: Secondary | ICD-10-CM

## 2023-04-16 DIAGNOSIS — E119 Type 2 diabetes mellitus without complications: Secondary | ICD-10-CM

## 2023-04-16 DIAGNOSIS — J189 Pneumonia, unspecified organism: Secondary | ICD-10-CM | POA: Diagnosis present

## 2023-04-16 DIAGNOSIS — Z89511 Acquired absence of right leg below knee: Secondary | ICD-10-CM | POA: Diagnosis not present

## 2023-04-16 DIAGNOSIS — R55 Syncope and collapse: Secondary | ICD-10-CM | POA: Diagnosis present

## 2023-04-16 LAB — DIFFERENTIAL
Abs Immature Granulocytes: 0.02 10*3/uL (ref 0.00–0.07)
Basophils Absolute: 0 10*3/uL (ref 0.0–0.1)
Basophils Relative: 0 %
Eosinophils Absolute: 0 10*3/uL (ref 0.0–0.5)
Eosinophils Relative: 0 %
Immature Granulocytes: 0 %
Lymphocytes Relative: 30 %
Lymphs Abs: 1.8 10*3/uL (ref 0.7–4.0)
Monocytes Absolute: 0.4 10*3/uL (ref 0.1–1.0)
Monocytes Relative: 6 %
Neutro Abs: 3.7 10*3/uL (ref 1.7–7.7)
Neutrophils Relative %: 64 %

## 2023-04-16 LAB — RESP PANEL BY RT-PCR (RSV, FLU A&B, COVID)  RVPGX2
Influenza A by PCR: POSITIVE — AB
Influenza B by PCR: NEGATIVE
Resp Syncytial Virus by PCR: NEGATIVE
SARS Coronavirus 2 by RT PCR: NEGATIVE

## 2023-04-16 LAB — COMPREHENSIVE METABOLIC PANEL
ALT: 12 U/L (ref 0–44)
AST: 21 U/L (ref 15–41)
Albumin: 3.5 g/dL (ref 3.5–5.0)
Alkaline Phosphatase: 36 U/L — ABNORMAL LOW (ref 38–126)
Anion gap: 11 (ref 5–15)
BUN: 18 mg/dL (ref 8–23)
CO2: 29 mmol/L (ref 22–32)
Calcium: 8.7 mg/dL — ABNORMAL LOW (ref 8.9–10.3)
Chloride: 101 mmol/L (ref 98–111)
Creatinine, Ser: 1.07 mg/dL (ref 0.61–1.24)
GFR, Estimated: >60 mL/min (ref >60)
Glucose, Bld: 112 mg/dL — ABNORMAL HIGH (ref 70–99)
Potassium: 3.2 mmol/L — ABNORMAL LOW (ref 3.5–5.1)
Sodium: 141 mmol/L (ref 135–145)
Total Bilirubin: 0.5 mg/dL (ref 0.0–1.2)
Total Protein: 6.8 g/dL (ref 6.5–8.1)

## 2023-04-16 LAB — CBC
HCT: 39.9 % (ref 39.0–52.0)
Hemoglobin: 12.8 g/dL — ABNORMAL LOW (ref 13.0–17.0)
MCH: 30.5 pg (ref 26.0–34.0)
MCHC: 32.1 g/dL (ref 30.0–36.0)
MCV: 95.2 fL (ref 80.0–100.0)
Platelets: 247 10*3/uL (ref 150–400)
RBC: 4.19 MIL/uL — ABNORMAL LOW (ref 4.22–5.81)
RDW: 13.2 % (ref 11.5–15.5)
WBC: 6 10*3/uL (ref 4.0–10.5)
nRBC: 0 % (ref 0.0–0.2)

## 2023-04-16 LAB — CBG MONITORING, ED: Glucose-Capillary: 101 mg/dL — ABNORMAL HIGH (ref 70–99)

## 2023-04-16 LAB — BRAIN NATRIURETIC PEPTIDE: B Natriuretic Peptide: 31 pg/mL (ref 0.0–100.0)

## 2023-04-16 LAB — LACTIC ACID, PLASMA: Lactic Acid, Venous: 1.4 mmol/L (ref 0.5–1.9)

## 2023-04-16 MED ORDER — ASPIRIN 81 MG PO TBEC
81.0000 mg | DELAYED_RELEASE_TABLET | Freq: Every day | ORAL | Status: DC
  Administered 2023-04-17 – 2023-04-18 (×2): 81 mg via ORAL
  Filled 2023-04-16 (×2): qty 1

## 2023-04-16 MED ORDER — CARVEDILOL 12.5 MG PO TABS
25.0000 mg | ORAL_TABLET | Freq: Two times a day (BID) | ORAL | Status: DC
  Administered 2023-04-17 – 2023-04-18 (×3): 25 mg via ORAL
  Filled 2023-04-16 (×3): qty 2

## 2023-04-16 MED ORDER — POTASSIUM CHLORIDE CRYS ER 20 MEQ PO TBCR
20.0000 meq | EXTENDED_RELEASE_TABLET | Freq: Two times a day (BID) | ORAL | Status: DC
  Administered 2023-04-17: 20 meq via ORAL
  Filled 2023-04-16: qty 1

## 2023-04-16 MED ORDER — SODIUM CHLORIDE 0.9 % IV SOLN: 2.0000 g | INTRAVENOUS | Status: DC

## 2023-04-16 MED ORDER — VITAMIN D 25 MCG (1000 UNIT) PO TABS
1000.0000 [IU] | ORAL_TABLET | Freq: Every day | ORAL | Status: DC
  Administered 2023-04-17 – 2023-04-18 (×2): 1000 [IU] via ORAL
  Filled 2023-04-16 (×2): qty 1

## 2023-04-16 MED ORDER — ONDANSETRON HCL 4 MG PO TABS: 4.0000 mg | ORAL_TABLET | Freq: Four times a day (QID) | ORAL | Status: DC | PRN

## 2023-04-16 MED ORDER — SODIUM CHLORIDE 0.9 % IV SOLN
1.0000 g | Freq: Once | INTRAVENOUS | Status: AC
  Administered 2023-04-16: 1 g via INTRAVENOUS
  Filled 2023-04-16: qty 10

## 2023-04-16 MED ORDER — TIOTROPIUM BROMIDE MONOHYDRATE 18 MCG IN CAPS: 18.0000 ug | ORAL_CAPSULE | Freq: Every day | RESPIRATORY_TRACT | Status: DC

## 2023-04-16 MED ORDER — DOXEPIN HCL 10 MG PO CAPS: 100.0000 mg | ORAL_CAPSULE | Freq: Every day | ORAL | Status: DC

## 2023-04-16 MED ORDER — SPIRONOLACTONE 25 MG PO TABS: 25.0000 mg | ORAL_TABLET | Freq: Every day | ORAL | Status: DC

## 2023-04-16 MED ORDER — METOPROLOL TARTRATE 5 MG/5ML IV SOLN: 5.0000 mg | Freq: Four times a day (QID) | INTRAVENOUS | Status: DC | PRN

## 2023-04-16 MED ORDER — IBUPROFEN 400 MG PO TABS: 400.0000 mg | ORAL_TABLET | Freq: Four times a day (QID) | ORAL | Status: DC | PRN

## 2023-04-16 MED ORDER — ENOXAPARIN SODIUM 40 MG/0.4ML IJ SOSY
40.0000 mg | PREFILLED_SYRINGE | INTRAMUSCULAR | Status: DC
  Administered 2023-04-16 – 2023-04-17 (×2): 40 mg via SUBCUTANEOUS
  Filled 2023-04-16 (×2): qty 0.4

## 2023-04-16 MED ORDER — SODIUM CHLORIDE 0.9 % IV BOLUS
500.0000 mL | Freq: Once | INTRAVENOUS | Status: AC
  Administered 2023-04-16: 500 mL via INTRAVENOUS

## 2023-04-16 MED ORDER — DIVALPROEX SODIUM ER 250 MG PO TB24: 250.0000 mg | ORAL_TABLET | Freq: Every day | ORAL | Status: DC

## 2023-04-16 MED ORDER — ALBUTEROL SULFATE (2.5 MG/3ML) 0.083% IN NEBU: 2.5000 mg | INHALATION_SOLUTION | Freq: Three times a day (TID) | RESPIRATORY_TRACT | Status: DC | PRN

## 2023-04-16 MED ORDER — LORATADINE 10 MG PO TABS
10.0000 mg | ORAL_TABLET | Freq: Every day | ORAL | Status: DC
  Administered 2023-04-18: 10 mg via ORAL
  Filled 2023-04-16 (×2): qty 1

## 2023-04-16 MED ORDER — ALBUTEROL SULFATE HFA 108 (90 BASE) MCG/ACT IN AERS: 2.0000 | INHALATION_SPRAY | Freq: Four times a day (QID) | RESPIRATORY_TRACT | Status: DC

## 2023-04-16 MED ORDER — ONDANSETRON HCL 4 MG/2ML IJ SOLN
4.0000 mg | Freq: Four times a day (QID) | INTRAMUSCULAR | Status: DC | PRN
Start: 2023-04-16 — End: 2023-04-18

## 2023-04-16 MED ORDER — BUPROPION HCL ER (SR) 150 MG PO TB12
150.0000 mg | ORAL_TABLET | Freq: Two times a day (BID) | ORAL | Status: DC
  Administered 2023-04-17 – 2023-04-18 (×2): 150 mg via ORAL
  Filled 2023-04-16 (×2): qty 1

## 2023-04-16 MED ORDER — GABAPENTIN 300 MG PO CAPS
600.0000 mg | ORAL_CAPSULE | Freq: Two times a day (BID) | ORAL | Status: DC
  Administered 2023-04-17 – 2023-04-18 (×2): 600 mg via ORAL
  Filled 2023-04-16 (×2): qty 2

## 2023-04-16 MED ORDER — POLYETHYLENE GLYCOL 3350 17 G PO PACK: 17.0000 g | PACK | Freq: Every day | ORAL | Status: DC | PRN

## 2023-04-16 MED ORDER — MOMETASONE FURO-FORMOTEROL FUM 200-5 MCG/ACT IN AERO
2.0000 | INHALATION_SPRAY | Freq: Two times a day (BID) | RESPIRATORY_TRACT | Status: DC
  Administered 2023-04-16 – 2023-04-18 (×4): 2 via RESPIRATORY_TRACT

## 2023-04-16 MED ORDER — AZITHROMYCIN 250 MG PO TABS
500.0000 mg | ORAL_TABLET | Freq: Every day | ORAL | Status: DC
  Administered 2023-04-16 – 2023-04-17 (×2): 500 mg via ORAL
  Filled 2023-04-16 (×2): qty 2

## 2023-04-16 MED ORDER — FAMOTIDINE 20 MG PO TABS
40.0000 mg | ORAL_TABLET | Freq: Two times a day (BID) | ORAL | Status: DC
  Administered 2023-04-17 – 2023-04-18 (×2): 40 mg via ORAL
  Filled 2023-04-16 (×2): qty 2

## 2023-04-16 MED ORDER — ALBUTEROL SULFATE (2.5 MG/3ML) 0.083% IN NEBU
2.5000 mg | INHALATION_SOLUTION | Freq: Four times a day (QID) | RESPIRATORY_TRACT | Status: DC
  Administered 2023-04-16 – 2023-04-18 (×7): 2.5 mg via RESPIRATORY_TRACT
  Filled 2023-04-16 (×7): qty 3

## 2023-04-16 MED ORDER — ALBUTEROL SULFATE (2.5 MG/3ML) 0.083% IN NEBU: 2.5000 mg | INHALATION_SOLUTION | RESPIRATORY_TRACT | Status: DC | PRN

## 2023-04-16 MED ORDER — TAMSULOSIN HCL 0.4 MG PO CAPS
0.4000 mg | ORAL_CAPSULE | Freq: Every day | ORAL | Status: DC
Start: 2023-04-16 — End: 2023-04-18
  Administered 2023-04-17 – 2023-04-18 (×2): 0.4 mg via ORAL
  Filled 2023-04-16 (×2): qty 1

## 2023-04-16 MED ORDER — HYDROCODONE-ACETAMINOPHEN 5-325 MG PO TABS: 1.0000 | ORAL_TABLET | ORAL | Status: DC | PRN

## 2023-04-16 MED ORDER — MELATONIN 3 MG PO TABS
9.0000 mg | ORAL_TABLET | Freq: Every day | ORAL | Status: DC
  Administered 2023-04-16 – 2023-04-17 (×2): 9 mg via ORAL
  Filled 2023-04-16 (×3): qty 3

## 2023-04-16 MED ORDER — IPRATROPIUM-ALBUTEROL 0.5-2.5 (3) MG/3ML IN SOLN
3.0000 mL | Freq: Once | RESPIRATORY_TRACT | Status: AC
  Administered 2023-04-16: 3 mL via RESPIRATORY_TRACT
  Filled 2023-04-16: qty 3
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
## 2023-04-16 NOTE — Assessment & Plan Note (Signed)
 Continue Coreg Continue Lopressor Continue spironolactone

## 2023-04-16 NOTE — Assessment & Plan Note (Signed)
 Per patient he has been taken off his diabetic medications. Continue diabetic diet
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
## 2023-04-16 NOTE — Assessment & Plan Note (Signed)
 Related to pneumonia and influenza as well as underlying COPD. Respiratory care as outlined above

## 2023-04-16 NOTE — Assessment & Plan Note (Signed)
 Will continue his home inhalers.

## 2023-04-16 NOTE — ED Provider Notes (Signed)
 [facility] DEPARTMENT AT [facility] Provider Note   CSN: [id number] Arrival date & time: 04/16/23  1516     History  Chief Complaint  Patient presents with   Loss of Consciousness    Mark DELSANTO is a 78 y.o. male.  Patient with one week of progressively worsening SOB/DOE, chest congestion, cough and generalized weakness. Today reports single syncopal episode while on the way to the hospital. No vomiting or diarrhea. States he is eating and drinking per his usual. No urinary symptoms. Former smoker - quit >10 years ago.  The history is provided by the patient. No language interpreter was used.  Loss of Consciousness      Home Medications Prior to Admission medications   Medication Sig Start Date End Date Taking? Authorizing Provider  acetaminophen (TYLENOL) 325 MG tablet Take 2 tablets (650 mg total) by mouth every 6 (six) hours as needed for mild pain (or Fever >/= 101). 04/17/19   Emokpae, Courage, MD  albuterol (PROVENTIL HFA;VENTOLIN HFA) 108 (90 BASE) MCG/ACT inhaler Inhale 2 puffs into the lungs 4 (four) times daily.    Carles Collet, MD  albuterol (PROVENTIL) (2.5 MG/3ML) 0.083% nebulizer solution Take 2.5 mg by nebulization 3 (three) times daily as needed for wheezing or shortness of breath.    [provider]  aspirin EC 81 MG tablet Take 1 tablet (81 mg total) by mouth daily with breakfast. 04/17/19   Shon Hale, MD  budesonide-formoterol (SYMBICORT) 160-4.5 MCG/ACT inhaler Inhale 2 puffs into the lungs 2 (two) times daily. 04/17/19   Shon Hale, MD  buPROPion (ZYBAN) 150 MG 12 hr tablet Take 150 mg by mouth 2 (two) times daily.    [provider]  carboxymethylcellulose (REFRESH PLUS) 0.5 % SOLN 1 drop 4 (four) times daily as needed (for dry eyes).    [provider]  carvedilol (COREG) 25 MG tablet Take 1 tablet (25 mg total) by mouth 2 (two) times daily with a meal. 03/08/20   Tat, Onalee Hua, MD   Cholecalciferol (VITAMIN D PO) Take 1,000 capsules by mouth daily.     [provider]  diclofenac Sodium (VOLTAREN) 1 % GEL Apply 4 g topically 3 (three) times daily as needed (for pain).    [provider]  divalproex (DEPAKOTE ER) 250 MG 24 hr tablet Take 250 mg by mouth daily.    [provider]  doxepin (SINEQUAN) 50 MG capsule Take 100 mg by mouth at bedtime.    [provider]  famotidine (PEPCID) 40 MG tablet Take 40 mg by mouth 2 (two) times daily.    [provider]  gabapentin (NEURONTIN) 300 MG capsule Take 600 mg by mouth in the morning and at bedtime.     [provider]  lactobacillus acidophilus (BACID) TABS tablet Take 1 tablet by mouth 2 (two) times daily.    [provider]  loratadine (CLARITIN) 10 MG tablet Take 10 mg by mouth daily.    [provider]  Melatonin 5 MG TABS Take 10 mg by mouth at bedtime.    [provider]  potassium chloride SA (KLOR-CON) 20 MEQ tablet Take 1 tablet (20 mEq total) by mouth 2 (two) times daily. 03/08/20   Catarina Hartshorn, MD  predniSONE (DELTASONE) 50 MG tablet Take 1 tablet (50 mg total) by mouth 2 (two) times daily with a meal. 03/08/20   Tat, Onalee Hua, MD  sildenafil (VIAGRA) 100 MG tablet Take 100 mg by mouth daily as needed  for erectile dysfunction. Do not take within 6 hours of Terazosin, Prazosin, or Doxazosin.    [provider]  spironolactone (ALDACTONE) 25 MG tablet Take 1 tablet (25 mg total) by mouth daily. 03/08/20   Catarina Hartshorn, MD  tamsulosin (FLOMAX) 0.4 MG CAPS capsule Take 0.4 mg by mouth daily.    [provider]  tiotropium (SPIRIVA) 18 MCG inhalation capsule Place 1 capsule (18 mcg total) into inhaler and inhale daily. 04/17/19   Shon Hale, MD  triamcinolone (KENALOG) 0.025 % cream Apply 1 application topically 2 (two) times daily.    [provider]      Allergies    Patient has no known allergies.    Review of  Systems   Review of Systems  Cardiovascular:  Positive for syncope.    Physical Exam Updated Vital Signs BP 111/71   Pulse 61   Temp 97.8 F (36.6 C) (Oral)   Resp 20   Ht 5\' 3"  (1.6 m)   Wt 69.9 kg   SpO2 99%   BMI 27.28 kg/m  Physical Exam Constitutional:      Appearance: He is well-developed.  HENT:     Head: Normocephalic.  Cardiovascular:     Rate and Rhythm: Normal rate and regular rhythm.     Heart sounds: No murmur heard. Pulmonary:     Effort: Pulmonary effort is normal.     Breath sounds: Rales (scattered, bilateral) present. No wheezing or rhonchi.     Comments: Shallow breath sounds Abdominal:     General: Bowel sounds are normal.     Palpations: Abdomen is soft.     Tenderness: There is no abdominal tenderness. There is no guarding or rebound.  Musculoskeletal:        General: Normal range of motion.     Cervical back: Normal range of motion and neck supple.  Skin:    General: Skin is warm and dry.  Neurological:     General: No focal deficit present.     Mental Status: He is alert and oriented to person, place, and time.     ED Results / Procedures / Treatments   Labs (all labs ordered are listed, but only abnormal results are displayed) Labs Reviewed  RESP PANEL BY RT-PCR (RSV, FLU A&B, COVID)  [id number] - Abnormal; Notable for the following components:      Result Value   Influenza A by PCR POSITIVE (*)    All other components within normal limits  CBC - Abnormal; Notable for the following components:   RBC 4.19 (*)    Hemoglobin 12.8 (*)    All other components within normal limits  COMPREHENSIVE METABOLIC PANEL - Abnormal; Notable for the following components:   Potassium 3.2 (*)    Glucose, Bld 112 (*)    Calcium 8.7 (*)    Alkaline Phosphatase 36 (*)    All other components within normal limits  CBG MONITORING, ED - Abnormal; Notable for the following components:   Glucose-Capillary 101 (*)    All other components within normal limits   BRAIN NATRIURETIC PEPTIDE  DIFFERENTIAL  URINALYSIS, ROUTINE W REFLEX MICROSCOPIC  LACTIC ACID, PLASMA  LACTIC ACID, PLASMA   Results for orders placed or performed during the hospital encounter of 04/16/23  CBG monitoring, ED   Collection Time: 04/16/23  3:37 PM  Result Value Ref Range   Glucose-Capillary 101 (H) 70 - 99 mg/dL  CBC   Collection Time: 04/16/23  4:24 PM  Result Value Ref  Range   WBC 6.0 4.0 - 10.5 K/uL   RBC 4.19 (L) 4.22 - 5.81 MIL/uL   Hemoglobin 12.8 (L) 13.0 - 17.0 g/dL   HCT 11.9 14.7 - 82.9 %   MCV 95.2 80.0 - 100.0 fL   MCH 30.5 26.0 - 34.0 pg   MCHC 32.1 30.0 - 36.0 g/dL   RDW 56.2 13.0 - 86.5 %   Platelets 247 150 - 400 K/uL   nRBC 0.0 0.0 - 0.2 %  Brain natriuretic peptide   Collection Time: 04/16/23  4:24 PM  Result Value Ref Range   B Natriuretic Peptide 31.0 0.0 - 100.0 pg/mL  Comprehensive metabolic panel   Collection Time: 04/16/23  4:24 PM  Result Value Ref Range   Sodium 141 135 - 145 mmol/L   Potassium 3.2 (L) 3.5 - 5.1 mmol/L   Chloride 101 98 - 111 mmol/L   CO2 29 22 - 32 mmol/L   Glucose, Bld 112 (H) 70 - 99 mg/dL   BUN 18 8 - 23 mg/dL   Creatinine, Ser 7.84 0.61 - 1.24 mg/dL   Calcium 8.7 (L) 8.9 - 10.3 mg/dL   Total Protein 6.8 6.5 - 8.1 g/dL   Albumin 3.5 3.5 - 5.0 g/dL   AST 21 15 - 41 U/L   ALT 12 0 - 44 U/L   Alkaline Phosphatase 36 (L) 38 - 126 U/L   Total Bilirubin 0.5 0.0 - 1.2 mg/dL   GFR, Estimated >69 >62 mL/min   Anion gap 11 5 - 15  Differential   Collection Time: 04/16/23  4:24 PM  Result Value Ref Range   Neutrophils Relative % 64 %   Neutro Abs 3.7 1.7 - 7.7 K/uL   Lymphocytes Relative 30 %   Lymphs Abs 1.8 0.7 - 4.0 K/uL   Monocytes Relative 6 %   Monocytes Absolute 0.4 0.1 - 1.0 K/uL   Eosinophils Relative 0 %   Eosinophils Absolute 0.0 0.0 - 0.5 K/uL   Basophils Relative 0 %   Basophils Absolute 0.0 0.0 - 0.1 K/uL   Immature Granulocytes 0 %   Abs Immature Granulocytes 0.02 0.00 - 0.07 K/uL  Resp  panel by RT-PCR (RSV, Flu A&B, Covid) Anterior Nasal Swab   Collection Time: 04/16/23  4:25 PM   Specimen: Anterior Nasal Swab  Result Value Ref Range   SARS Coronavirus 2 by RT PCR NEGATIVE NEGATIVE   Influenza A by PCR POSITIVE (A) NEGATIVE   Influenza B by PCR NEGATIVE NEGATIVE   Resp Syncytial Virus by PCR NEGATIVE NEGATIVE    EKG None  Radiology DG Chest Port 1 View Result Date: 04/16/2023 CLINICAL DATA:  One-week history of congestion.  Syncopal episode. EXAM: PORTABLE CHEST 1 VIEW COMPARISON:  CTA chest dated 03/05/2020, chest radiograph dated 12/18/2021 FINDINGS: Normal lung volumes. Patchy right basilar opacity. No pleural effusion or pneumothorax. The heart size and mediastinal contours are within normal limits. No acute osseous abnormality. IMPRESSION: Patchy right basilar opacity, likely atelectasis/scarring. Aspiration or pneumonia can be considered in the appropriate clinical setting. Electronically Signed   By: Agustin Cree M.D.   On: 04/16/2023 17:14    Procedures Procedures    Medications Ordered in ED Medications  cefTRIAXone (ROCEPHIN) 1 g in sodium chloride 0.9 % 100 mL IVPB (1 g Intravenous New Bag/Given 04/16/23 1817)  sodium chloride 0.9 % bolus 500 mL (0 mLs Intravenous Stopped 04/16/23 1817)  ipratropium-albuterol (DUONEB) 0.5-2.5 (3) MG/3ML nebulizer solution 3 mL (3 mLs Nebulization Given 04/16/23 1756)  ED Course/ Medical Decision Making/ A&P                                 Medical Decision Making This patient presents to the ED for concern of SOB, cough, this involves an extensive number of treatment options, and is a complaint that carries with it a high risk of complications and morbidity.  The differential diagnosis includes PNA, COVID, Influenza, sepsis, CAD, CHF   Co morbidities that complicate the patient evaluation  COPD, HTN, PTSD, T2DM, HLD   Additional history obtained:  Additional history and/or information obtained from chart review,  notable for EMR   Lab Tests:  I Ordered, and personally interpreted labs.  The pertinent results include:  Influenza A+; K+ 3.2, normal electrolytes otherwise, normal renal function. No leukocytosis.     Imaging Studies ordered:  I ordered imaging studies including CXR Per radiologist interpretation: patchy right basilar opacity. Likely scarring/atx, PNA not ruled out   Cardiac Monitoring:  The patient was maintained on a cardiac monitor.  I personally viewed and interpreted the cardiac monitored which showed an underlying rhythm of: Sinus rhythm, rate 57   Critical Interventions:  Oxygen via [place] placed for hypoxia on arrival of 86%, improved to 98% (2L)   Problem List / ED Course:  One week of URI symptoms without fever, progressive weakness, today with near syncopal episode Arrives hypoxic, hypotensive 97/55 Awake, in NAD  Influenza A+, CXR ?PNA Start Rocephin Lactic acid added - pending Albuterol - duoneb provided  Will admit for further management.     Social Determinants of Health:  Lives at home with wife Raising great-grandchild, now 62 yo   Disposition:  After consideration of the diagnostic results and the patients response to treatment, I feel that the patient would benefit from Admit.   Amount and/or Complexity of Data Reviewed Labs: ordered. Radiology: ordered.  Risk Prescription drug management.           Final Clinical Impression(s) / ED Diagnoses Final diagnoses:  Influenza A  Hypoxia  History of COPD    Rx / DC Orders ED Discharge Orders     None         Danne Harbor 04/16/23 1819    Bethann Berkshire, MD 04/17/23 1119

## 2023-04-16 NOTE — Assessment & Plan Note (Addendum)
 Given that he has had flu symptoms for a week and has acutely gotten worse suspect superimposed pneumonia. Patient also had borderline blood pressures on presentation which responded well to IV fluids. Start Rocephin and azithromycin Check Legionella and urinary pneumococcus Oxygen therapy as needed

## 2023-04-16 NOTE — ED Notes (Signed)
 Pt placed on 3L of oxygen d/t oxygen saturation being 86% during triage. Pt oxygen saturation increased to 94% on 3L.
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
## 2023-04-16 NOTE — Assessment & Plan Note (Signed)
Replete and trend 

## 2023-04-16 NOTE — Hospital Course (Signed)
 Patient is a 78 year old with history of T2DM, HTN, COPD who presents with a 1 week history of flulike illness.  He was at an event today and had a near syncopal episode.  He reports shortness of breath and chest pain with cough.  His cough is productive of green sputum.  On initial evaluation in the ED was noted to be hypoxic and was 86% on room air.  He received some DuoNeb's and continue to require oxygen.  Chest x-ray showed haziness versus atelectasis consistent with possible early pneumonia.  He was started on Rocephin.  He was positive for influenza A.  Patient was initially noted to be hypotensive and his blood pressure improved with IV fluid hydration.

## 2023-04-16 NOTE — ED Triage Notes (Signed)
 Pt states upper congestion for about a week. Pt states on the way to the ED having a syncopal episode. Registration called nursing staff when tech went out to car pt ambulated to wheel chair and than had family push him to the restroom before triage. Pt states weakness and just not feeling well.

## 2023-04-17 DIAGNOSIS — J9601 Acute respiratory failure with hypoxia: Secondary | ICD-10-CM | POA: Diagnosis not present

## 2023-04-17 LAB — BASIC METABOLIC PANEL
Anion gap: 8 (ref 5–15)
BUN: 15 mg/dL (ref 8–23)
CO2: 31 mmol/L (ref 22–32)
Calcium: 8.1 mg/dL — ABNORMAL LOW (ref 8.9–10.3)
Chloride: 103 mmol/L (ref 98–111)
Creatinine, Ser: 0.8 mg/dL (ref 0.61–1.24)
GFR, Estimated: >60 mL/min (ref >60)
Glucose, Bld: 68 mg/dL — ABNORMAL LOW (ref 70–99)
Potassium: 3.2 mmol/L — ABNORMAL LOW (ref 3.5–5.1)
Sodium: 142 mmol/L (ref 135–145)

## 2023-04-17 LAB — STREP PNEUMONIAE URINARY ANTIGEN: Strep Pneumo Urinary Antigen: NEGATIVE

## 2023-04-17 LAB — URINALYSIS, ROUTINE W REFLEX MICROSCOPIC
Bacteria, UA: NONE SEEN
Bilirubin Urine: NEGATIVE
Glucose, UA: NEGATIVE mg/dL
Ketones, ur: NEGATIVE mg/dL
Leukocytes,Ua: NEGATIVE
Nitrite: POSITIVE — AB
Protein, ur: NEGATIVE mg/dL
Specific Gravity, Urine: 1.024 (ref 1.005–1.030)
pH: 5 (ref 5.0–8.0)

## 2023-04-17 LAB — CBC
HCT: 35.3 % — ABNORMAL LOW (ref 39.0–52.0)
Hemoglobin: 11.3 g/dL — ABNORMAL LOW (ref 13.0–17.0)
MCH: 30.2 pg (ref 26.0–34.0)
MCHC: 32 g/dL (ref 30.0–36.0)
MCV: 94.4 fL (ref 80.0–100.0)
Platelets: 217 10*3/uL (ref 150–400)
RBC: 3.74 MIL/uL — ABNORMAL LOW (ref 4.22–5.81)
RDW: 13 % (ref 11.5–15.5)
WBC: 4.2 10*3/uL (ref 4.0–10.5)
nRBC: 0 % (ref 0.0–0.2)

## 2023-04-17 LAB — GLUCOSE, CAPILLARY
Glucose-Capillary: 83 mg/dL (ref 70–99)
Glucose-Capillary: 117 mg/dL — ABNORMAL HIGH (ref 70–99)
Glucose-Capillary: 133 mg/dL — ABNORMAL HIGH (ref 70–99)
Glucose-Capillary: 218 mg/dL — ABNORMAL HIGH (ref 70–99)

## 2023-04-17 LAB — HEMOGLOBIN A1C
Hgb A1c MFr Bld: 6.3 % — ABNORMAL HIGH (ref 4.8–5.6)
Mean Plasma Glucose: 134.11 mg/dL

## 2023-04-17 LAB — PROCALCITONIN: Procalcitonin: <0.1 ng/mL

## 2023-04-17 MED ORDER — POTASSIUM CHLORIDE CRYS ER 20 MEQ PO TBCR
40.0000 meq | EXTENDED_RELEASE_TABLET | Freq: Two times a day (BID) | ORAL | Status: DC
  Administered 2023-04-17 – 2023-04-18 (×3): 40 meq via ORAL
  Filled 2023-04-17 (×3): qty 2

## 2023-04-17 MED ORDER — METHYLPREDNISOLONE SODIUM SUCC 40 MG IJ SOLR
40.0000 mg | Freq: Two times a day (BID) | INTRAMUSCULAR | Status: DC
  Administered 2023-04-17 – 2023-04-18 (×3): 40 mg via INTRAVENOUS
  Filled 2023-04-17 (×3): qty 1

## 2023-04-17 MED ORDER — OSELTAMIVIR PHOSPHATE 75 MG PO CAPS
75.0000 mg | ORAL_CAPSULE | Freq: Two times a day (BID) | ORAL | Status: DC
  Administered 2023-04-17 – 2023-04-18 (×3): 75 mg via ORAL
  Filled 2023-04-17 (×3): qty 1

## 2023-04-17 MED ORDER — INSULIN ASPART 100 UNIT/ML IJ SOLN
0.0000 [IU] | Freq: Three times a day (TID) | INTRAMUSCULAR | Status: DC
  Administered 2023-04-18: 1 [IU] via SUBCUTANEOUS

## 2023-04-17 MED ORDER — UMECLIDINIUM BROMIDE 62.5 MCG/ACT IN AEPB
1.0000 | INHALATION_SPRAY | Freq: Every day | RESPIRATORY_TRACT | Status: DC
  Administered 2023-04-18: 1 via RESPIRATORY_TRACT
  Filled 2023-04-17: qty 7

## 2023-04-17 NOTE — Plan of Care (Signed)
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
## 2023-04-17 NOTE — Progress Notes (Signed)
   04/17/23 0838  TOC Brief Assessment  Insurance and Status Reviewed  Patient has primary care physician Yes  Home environment has been reviewed From home, spouse  Prior level of function: Independent  Prior/Current Home Services No current home services  Social Drivers of Health Review SDOH reviewed no interventions necessary  Readmission risk has been reviewed Yes  Transition of care needs no transition of care needs at this time    [place] notification complete.   Transition of Care Department [facility]) has reviewed patient and no TOC needs have been identified at this time. We will continue to monitor patient advancement through interdisciplinary progression rounds. If new patient transition needs arise, please place a TOC consult.

## 2023-04-18 DIAGNOSIS — J9601 Acute respiratory failure with hypoxia: Secondary | ICD-10-CM | POA: Diagnosis not present

## 2023-04-18 LAB — BASIC METABOLIC PANEL
Anion gap: 8 (ref 5–15)
BUN: 15 mg/dL (ref 8–23)
CO2: 28 mmol/L (ref 22–32)
Calcium: 9 mg/dL (ref 8.9–10.3)
Chloride: 103 mmol/L (ref 98–111)
Creatinine, Ser: 0.74 mg/dL (ref 0.61–1.24)
GFR, Estimated: >60 mL/min (ref >60)
Glucose, Bld: 167 mg/dL — ABNORMAL HIGH (ref 70–99)
Potassium: 4.6 mmol/L (ref 3.5–5.1)
Sodium: 139 mmol/L (ref 135–145)

## 2023-04-18 LAB — GLUCOSE, CAPILLARY: Glucose-Capillary: 185 mg/dL — ABNORMAL HIGH (ref 70–99)

## 2023-04-18 LAB — CBC
HCT: 42.9 % (ref 39.0–52.0)
Hemoglobin: 13.7 g/dL (ref 13.0–17.0)
MCH: 29.9 pg (ref 26.0–34.0)
MCHC: 31.9 g/dL (ref 30.0–36.0)
MCV: 93.7 fL (ref 80.0–100.0)
Platelets: 271 10*3/uL (ref 150–400)
RBC: 4.58 MIL/uL (ref 4.22–5.81)
RDW: 12.6 % (ref 11.5–15.5)
WBC: 6 10*3/uL (ref 4.0–10.5)
nRBC: 0 % (ref 0.0–0.2)

## 2023-04-18 LAB — MAGNESIUM: Magnesium: 2.1 mg/dL (ref 1.7–2.4)

## 2023-04-18 MED ORDER — ALBUTEROL SULFATE (2.5 MG/3ML) 0.083% IN NEBU: 2.5000 mg | INHALATION_SOLUTION | Freq: Three times a day (TID) | RESPIRATORY_TRACT | 12 refills | Status: AC | PRN

## 2023-04-18 MED ORDER — ASPIRIN EC 81 MG PO TBEC: 81.0000 mg | DELAYED_RELEASE_TABLET | Freq: Every day | ORAL | 2 refills | Status: AC

## 2023-04-18 MED ORDER — LORATADINE 10 MG PO TABS: 10.0000 mg | ORAL_TABLET | Freq: Every day | ORAL | 0 refills | Status: AC

## 2023-04-18 MED ORDER — OSELTAMIVIR PHOSPHATE 75 MG PO CAPS: 75.0000 mg | ORAL_CAPSULE | Freq: Two times a day (BID) | ORAL | 0 refills | Status: AC

## 2023-04-18 MED ORDER — BUDESONIDE-FORMOTEROL FUMARATE 160-4.5 MCG/ACT IN AERO: 2.0000 | INHALATION_SPRAY | Freq: Two times a day (BID) | RESPIRATORY_TRACT | 12 refills | Status: AC

## 2023-04-18 NOTE — Discharge Summary (Signed)
 Physician Discharge Summary  Mark Schroeder BJY:[id number] DOB: 1945-12-20 DOA: 04/16/2023  PCP: [facility]  Admit date: 04/16/2023  Discharge date: 04/18/2023  Admitted From:Home  Disposition:  Home  Recommendations for Outpatient Follow-up:  Follow up with PCP in 1-2 weeks Continue on Tamiflu to finish course of treatment as prescribed Continue home breathing treatments and prednisone as needed Continue other home medications as prior  Home Health: None  Equipment/Devices: None  Discharge Condition:Stable  CODE STATUS: Full  Diet recommendation: Heart Healthy/carb modified  Brief/Interim Summary:  Mark Schroeder is a 78 y.o. male with medical history significant of  T2DM, HTN, COPD who presents with a 1 week history of flulike illness.  He was at an event today and had a near syncopal episode.  He reports shortness of breath and chest pain with cough.  His cough is productive of green sputum.  On initial evaluation in the ED was noted to be hypoxic and was 86% on room air.  Patient was admitted with acute hypoxemic respiratory failure in the setting of acute COPD exacerbation with influenza A infection and suspicion of overlying CAP.  It was eventually determined that he did not have a bacterial infection and antibiotics were discontinued.  He was quickly weaned off of his oxygen requirements and his COPD exacerbation has improved.  He is now in stable condition for discharge with no other acute events or concerns noted.  Discharge Diagnoses:  Principal Problem:   Hypoxic respiratory failure (HCC) Active Problems:   CAP (community acquired pneumonia)   COPD with exacerbation (HCC)   Hypokalemia   Type 2 diabetes mellitus (HCC)   Hypertension   Hx of BKA, right (HCC)  Principal discharge diagnosis: Acute hypoxemic respiratory failure secondary to acute COPD exacerbation with influenza A infection.  Discharge Instructions  Discharge Instructions      Diet - low sodium heart healthy   Complete by: As directed    Increase activity slowly   Complete by: As directed       Allergies as of 04/18/2023   No Known Allergies      Medication List     TAKE these medications    acetaminophen 325 MG tablet Commonly known as: TYLENOL Take 2 tablets (650 mg total) by mouth every 6 (six) hours as needed for mild pain (or Fever >/= 101).   albuterol (2.5 MG/3ML) 0.083% nebulizer solution Commonly known as: PROVENTIL Take 3 mLs (2.5 mg total) by nebulization 3 (three) times daily as needed for wheezing or shortness of breath.   amLODipine 10 MG tablet Commonly known as: NORVASC Take 5 mg by mouth daily.   aspirin EC 81 MG tablet Take 1 tablet (81 mg total) by mouth daily with breakfast.   atorvastatin 40 MG tablet Commonly known as: LIPITOR Take 20 mg by mouth at bedtime.   azithromycin 250 MG tablet Commonly known as: ZITHROMAX Take 250 mg by mouth 3 (three) times a week. M,W,F   bacitracin ointment Apply 1 Application topically 2 (two) times daily as needed (skin abrasion).   Breztri Aerosphere 160-9-4.8 MCG/ACT Aero Generic drug: Budeson-Glycopyrrol-Formoterol Inhale 2 puffs into the lungs in the morning and at bedtime.   budesonide-formoterol 160-4.5 MCG/ACT inhaler Commonly known as: SYMBICORT Inhale 2 puffs into the lungs 2 (two) times daily.   buPROPion 150 MG 12 hr tablet Commonly known as: ZYBAN Take 150 mg by mouth daily.   carboxymethylcellulose 0.5 % Soln Commonly known as: REFRESH PLUS Place 1 drop into  both eyes 4 (four) times daily as needed (for dry eyes).   carvedilol 25 MG tablet Commonly known as: COREG Take 1 tablet (25 mg total) by mouth 2 (two) times daily with a meal.   cetirizine 10 MG tablet Commonly known as: ZYRTEC Take 10 mg by mouth at bedtime.   Eucerin Daily Hydration Crea Apply 1 Application topically in the morning and at bedtime.   famotidine 40 MG tablet Commonly known as:  PEPCID Take 40 mg by mouth 2 (two) times daily.   gabapentin 300 MG capsule Commonly known as: NEURONTIN Take 300 mg by mouth at bedtime.   guaiFENesin 100 MG/5ML liquid Commonly known as: ROBITUSSIN Take 10 mLs by mouth every 4 (four) hours as needed for cough or to loosen phlegm.   ipratropium 0.06 % nasal spray Commonly known as: ATROVENT Place 2 sprays into both nostrils in the morning and at bedtime.   lactobacillus acidophilus Tabs tablet Take 1 tablet by mouth daily.   loratadine 10 MG tablet Commonly known as: CLARITIN Take 1 tablet (10 mg total) by mouth daily.   melatonin 5 MG Tabs Take 10 mg by mouth at bedtime.   oseltamivir 75 MG capsule Commonly known as: TAMIFLU Take 1 capsule (75 mg total) by mouth 2 (two) times daily for 3 days.   predniSONE 50 MG tablet Commonly known as: DELTASONE Take 1 tablet (50 mg total) by mouth 2 (two) times daily with a meal. What changed:  when to take this reasons to take this   tamsulosin 0.4 MG Caps capsule Commonly known as: FLOMAX Take 0.8 mg by mouth daily after supper.   VITAMIN D PO Take 1,000 Units by mouth daily.        Follow-up Information     [facility] Schedule an appointment as soon as possible for a visit in 1 week(s).   Specialty: General Practice Contact information: [address] [place] [address] [phone]                No Known Allergies  Consultations: None   Procedures/Studies: DG Chest Port 1 View Result Date: 04/16/2023 CLINICAL DATA:  One-week history of congestion.  Syncopal episode. EXAM: PORTABLE CHEST 1 VIEW COMPARISON:  CTA chest dated 03/05/2020, chest radiograph dated 12/18/2021 FINDINGS: Normal lung volumes. Patchy right basilar opacity. No pleural effusion or pneumothorax. The heart size and mediastinal contours are within normal limits. No acute osseous abnormality. IMPRESSION: Patchy right basilar opacity, likely atelectasis/scarring. Aspiration or  pneumonia can be considered in the appropriate clinical setting. Electronically Signed   By: Agustin Cree M.D.   On: 04/16/2023 17:14     Discharge Exam: Vitals:   04/18/23 0814 04/18/23 0831  BP:    Pulse:    Resp:    Temp:    SpO2: 90% 96%   Vitals:   04/18/23 0159 04/18/23 0536 04/18/23 0814 04/18/23 0831  BP:  (!) 155/68    Pulse:  63    Resp:  16    Temp:  97.8 F (36.6 C)    TempSrc:  Oral    SpO2: 90% 95% 90% 96%  Weight:      Height:        General: Pt is alert, awake, not in acute distress Cardiovascular: RRR, S1/S2 +, no rubs, no gallops Respiratory: CTA bilaterally, no wheezing, no rhonchi Abdominal: Soft, NT, ND, bowel sounds + Extremities: no edema, no cyanosis, right BKA    The results of significant diagnostics from  this hospitalization (including imaging, microbiology, ancillary and laboratory) are listed below for reference.     Microbiology: Recent Results (from the past 240 hours)  Resp panel by RT-PCR (RSV, Flu A&B, Covid) Anterior Nasal Swab     Status: Abnormal   Collection Time: 04/16/23  4:25 PM   Specimen: Anterior Nasal Swab  Result Value Ref Range Status   SARS Coronavirus 2 by RT PCR NEGATIVE NEGATIVE Final    Comment: (NOTE) SARS-CoV-2 target nucleic acids are NOT DETECTED.  The SARS-CoV-2 RNA is generally detectable in upper respiratory specimens during the acute phase of infection. The lowest concentration of SARS-CoV-2 viral copies this assay can detect is 138 copies/mL. A negative result does not preclude SARS-Cov-2 infection and should not be used as the sole basis for treatment or other patient management decisions. A negative result may occur with  improper specimen collection/handling, submission of specimen other than nasopharyngeal swab, presence of viral mutation(s) within the areas targeted by this assay, and inadequate number of viral copies(<138 copies/mL). A negative result must be combined with clinical observations,  patient history, and epidemiological information. The expected result is Negative.  Fact Sheet for Patients:  BloggerCourse.com  Fact Sheet for Healthcare Providers:  SeriousBroker.it  This test is no t yet approved or cleared by the [place] FDA and  has been authorized for detection and/or diagnosis of SARS-CoV-2 by FDA under an Emergency Use Authorization (EUA). This EUA will remain  in effect (meaning this test can be used) for the duration of the COVID-19 declaration under Section 564(b)(1) of the Act, 21 U.S.C.section 360bbb-3(b)(1), unless the authorization is terminated  or revoked sooner.       Influenza A by PCR POSITIVE (A) NEGATIVE Final   Influenza B by PCR NEGATIVE NEGATIVE Final    Comment: (NOTE) The Xpert Xpress SARS-CoV-2/FLU/RSV plus assay is intended as an aid in the diagnosis of influenza from Nasopharyngeal swab specimens and should not be used as a sole basis for treatment. Nasal washings and aspirates are unacceptable for Xpert Xpress SARS-CoV-2/FLU/RSV testing.  Fact Sheet for Patients: BloggerCourse.com  Fact Sheet for Healthcare Providers: SeriousBroker.it  This test is not yet approved or cleared by the [place] FDA and has been authorized for detection and/or diagnosis of SARS-CoV-2 by FDA under an Emergency Use Authorization (EUA). This EUA will remain in effect (meaning this test can be used) for the duration of the COVID-19 declaration under Section 564(b)(1) of the Act, 21 U.S.C. section 360bbb-3(b)(1), unless the authorization is terminated or revoked.     Resp Syncytial Virus by PCR NEGATIVE NEGATIVE Final    Comment: (NOTE) Fact Sheet for Patients: BloggerCourse.com  Fact Sheet for Healthcare Providers: SeriousBroker.it  This test is not yet approved or cleared by the [place] FDA and has been authorized for detection and/or diagnosis of SARS-CoV-2 by FDA under an Emergency Use Authorization (EUA). This EUA will remain in effect (meaning this test can be used) for the duration of the COVID-19 declaration under Section 564(b)(1) of the Act, 21 U.S.C. section 360bbb-3(b)(1), unless the authorization is terminated or revoked.  Performed at [facility], [address] [place], [place] [address]      Labs: BNP (last 3 results) Recent Labs    04/16/23 1624  BNP 31.0   Basic Metabolic Panel: Recent Labs  Lab 04/16/23 1624 04/17/23 0422 04/18/23 0508  NA 141 142 139  K 3.2* 3.2* 4.6  CL 101 103 103  CO2 29 31 28  GLUCOSE 112* 68* 167*  BUN 18 15 15   CREATININE 1.07 0.80 0.74  CALCIUM 8.7* 8.1* 9.0  MG  --   --  2.1   Liver Function Tests: Recent Labs  Lab 04/16/23 1624  AST 21  ALT 12  ALKPHOS 36*  BILITOT 0.5  PROT 6.8  ALBUMIN 3.5   No results for input(s): "LIPASE", "AMYLASE" in the last 168 hours. No results for input(s): "AMMONIA" in the last 168 hours. CBC: Recent Labs  Lab 04/16/23 1624 04/17/23 0422 04/18/23 0508  WBC 6.0 4.2 6.0  NEUTROABS 3.7  --   --   HGB 12.8* 11.3* 13.7  HCT 39.9 35.3* 42.9  MCV 95.2 94.4 93.7  PLT 247 217 271   Cardiac Enzymes: No results for input(s): "CKTOTAL", "CKMB", "CKMBINDEX", "TROPONINI" in the last 168 hours. BNP: Invalid input(s): "POCBNP" CBG: Recent Labs  Lab 04/17/23 0735 04/17/23 1120 04/17/23 1638 04/17/23 2053 04/18/23 0721  GLUCAP 83 117* 133* 218* 185*   D-Dimer No results for input(s): "DDIMER" in the last 72 hours. Hgb A1c Recent Labs    04/17/23 0422  HGBA1C 6.3*   Lipid Profile No results for input(s): "CHOL", "HDL", "LDLCALC", "TRIG", "CHOLHDL", "LDLDIRECT" in the last 72 hours. Thyroid function studies No results for input(s): "TSH", "T4TOTAL", "T3FREE", "THYROIDAB" in the last 72 hours.  Invalid input(s): "FREET3" Anemia work up No results for  input(s): "VITAMINB12", "FOLATE", "FERRITIN", "TIBC", "IRON", "RETICCTPCT" in the last 72 hours. Urinalysis    Component Value Date/Time   COLORURINE YELLOW 04/17/2023 0140   APPEARANCEUR CLEAR 04/17/2023 0140   LABSPEC 1.024 04/17/2023 0140   PHURINE 5.0 04/17/2023 0140   GLUCOSEU NEGATIVE 04/17/2023 0140   HGBUR SMALL (A) 04/17/2023 0140   BILIRUBINUR NEGATIVE 04/17/2023 0140   KETONESUR NEGATIVE 04/17/2023 0140   PROTEINUR NEGATIVE 04/17/2023 0140   UROBILINOGEN >8.0 (H) 08/15/2014 1134   NITRITE POSITIVE (A) 04/17/2023 0140   LEUKOCYTESUR NEGATIVE 04/17/2023 0140   Sepsis Labs Recent Labs  Lab 04/16/23 1624 04/17/23 0422 04/18/23 0508  WBC 6.0 4.2 6.0   Microbiology Recent Results (from the past 240 hours)  Resp panel by RT-PCR (RSV, Flu A&B, Covid) Anterior Nasal Swab     Status: Abnormal   Collection Time: 04/16/23  4:25 PM   Specimen: Anterior Nasal Swab  Result Value Ref Range Status   SARS Coronavirus 2 by RT PCR NEGATIVE NEGATIVE Final    Comment: (NOTE) SARS-CoV-2 target nucleic acids are NOT DETECTED.  The SARS-CoV-2 RNA is generally detectable in upper respiratory specimens during the acute phase of infection. The lowest concentration of SARS-CoV-2 viral copies this assay can detect is 138 copies/mL. A negative result does not preclude SARS-Cov-2 infection and should not be used as the sole basis for treatment or other patient management decisions. A negative result may occur with  improper specimen collection/handling, submission of specimen other than nasopharyngeal swab, presence of viral mutation(s) within the areas targeted by this assay, and inadequate number of viral copies(<138 copies/mL). A negative result must be combined with clinical observations, patient history, and epidemiological information. The expected result is Negative.  Fact Sheet for Patients:  BloggerCourse.com  Fact Sheet for Healthcare Providers:   SeriousBroker.it  This test is no t yet approved or cleared by the [place] FDA and  has been authorized for detection and/or diagnosis of SARS-CoV-2 by FDA under an Emergency Use Authorization (EUA). This EUA will remain  in effect (meaning this test can be used) for the duration of the  COVID-19 declaration under Section 564(b)(1) of the Act, 21 U.S.C.section 360bbb-3(b)(1), unless the authorization is terminated  or revoked sooner.       Influenza A by PCR POSITIVE (A) NEGATIVE Final   Influenza B by PCR NEGATIVE NEGATIVE Final    Comment: (NOTE) The Xpert Xpress SARS-CoV-2/FLU/RSV plus assay is intended as an aid in the diagnosis of influenza from Nasopharyngeal swab specimens and should not be used as a sole basis for treatment. Nasal washings and aspirates are unacceptable for Xpert Xpress SARS-CoV-2/FLU/RSV testing.  Fact Sheet for Patients: BloggerCourse.com  Fact Sheet for Healthcare Providers: SeriousBroker.it  This test is not yet approved or cleared by the [place] FDA and has been authorized for detection and/or diagnosis of SARS-CoV-2 by FDA under an Emergency Use Authorization (EUA). This EUA will remain in effect (meaning this test can be used) for the duration of the COVID-19 declaration under Section 564(b)(1) of the Act, 21 U.S.C. section 360bbb-3(b)(1), unless the authorization is terminated or revoked.     Resp Syncytial Virus by PCR NEGATIVE NEGATIVE Final    Comment: (NOTE) Fact Sheet for Patients: BloggerCourse.com  Fact Sheet for Healthcare Providers: SeriousBroker.it  This test is not yet approved or cleared by the [place] FDA and has been authorized for detection and/or diagnosis of SARS-CoV-2 by FDA under an Emergency Use Authorization (EUA). This EUA will remain in effect (meaning this test can be used)  for the duration of the COVID-19 declaration under Section 564(b)(1) of the Act, 21 U.S.C. section 360bbb-3(b)(1), unless the authorization is terminated or revoked.  Performed at [facility], [address] [place], [place] [address]      Time coordinating discharge: 35 minutes  SIGNED:   Erick Blinks, DO [facility] 04/18/2023, 9:47 AM  If 7PM-7AM, please contact night-coverage [email or web address]

## 2023-04-19 LAB — LEGIONELLA PNEUMOPHILA SEROGP 1 UR AG: L. pneumophila Serogp 1 Ur Ag: NEGATIVE

## 2023-11-09 ENCOUNTER — Telehealth (HOSPITAL_COMMUNITY): Payer: Self-pay

## 2023-11-09 NOTE — Telephone Encounter (Signed)
 Outside/paper referral received from [facility] for [facility] rehab. [facility] called to let us  know patient changed his mind and does not want to come to [facility] for rehab, they are cancelling the authorization.

## 2023-11-14 ENCOUNTER — Encounter (HOSPITAL_COMMUNITY)

## 2023-11-14 DIAGNOSIS — J449 Chronic obstructive pulmonary disease, unspecified: Secondary | ICD-10-CM | POA: Insufficient documentation

## 2023-11-15 ENCOUNTER — Encounter (HOSPITAL_COMMUNITY)

## 2023-11-16 ENCOUNTER — Encounter (HOSPITAL_COMMUNITY)
Admission: RE | Admit: 2023-11-16 | Discharge: 2023-11-16 | Disposition: A | Discharge status: Home / Self Care | Source: Ambulatory Visit

## 2023-11-16 VITALS — Ht 62.0 in | Wt 152.5 lb

## 2023-11-16 DIAGNOSIS — J449 Chronic obstructive pulmonary disease, unspecified: Secondary | ICD-10-CM

## 2023-11-16 NOTE — Patient Instructions (Signed)
 Patient Instructions  Patient Details  Name: Mark Schroeder MRN: [id number] Date of Birth: 08-17-1945 Referring Provider:  Candyce Verla HERO, MD  Below are your personal goals for exercise, nutrition, and risk factors. Our goal is to help you stay on track towards obtaining and maintaining these goals. We will be discussing your progress on these goals with you throughout the program.  Initial Exercise Prescription:  Initial Exercise Prescription - 11/16/23 1400       Date of Initial Exercise RX and Referring Provider   Date 11/16/23    Referring Provider Candyce Verla MD   [facility]     Oxygen    Maintain Oxygen  Saturation 88% or higher      Treadmill   MPH 1.2    Grade 0.5    Minutes 15    METs 2      NuStep   Level 1    SPM 80    Minutes 15    METs 2      Prescription Details   Frequency (times per week) 3    Duration Progress to 30 minutes of continuous aerobic without signs/symptoms of physical distress      Intensity   THRR 40-80% of Max Heartrate 97-128    Ratings of Perceived Exertion 11-13    Perceived Dyspnea 0-4      Progression   Progression Continue to progress workloads to maintain intensity without signs/symptoms of physical distress.      Resistance Training   Training Prescription Yes    Weight 4 lb    Reps 10-15          Exercise Goals: Frequency: Be able to perform aerobic exercise two to three times per week in program working toward 2-5 days per week of home exercise.  Intensity: Work with a perceived exertion of 11 (fairly light) - 15 (hard) while following your exercise prescription.  We will make changes to your prescription with you as you progress through the program.   Duration: Be able to do 30 to 45 minutes of continuous aerobic exercise in addition to a 5 minute warm-up and a 5 minute cool-down routine.   Nutrition Goals: Your personal nutrition goals will be established when you do your nutrition analysis with the  dietician.  The following are general nutrition guidelines to follow: Cholesterol < 200mg /day Sodium < 1500mg /day Fiber: Men over 50 yrs - 30 grams per day  Personal Goals:  Personal Goals and Risk Factors at Admission - 11/16/23 1454       Core Components/Risk Factors/Patient Goals on Admission    Weight Management Yes;Weight Maintenance    Intervention Weight Management: Develop a combined nutrition and exercise program designed to reach desired caloric intake, while maintaining appropriate intake of nutrient and fiber, sodium and fats, and appropriate energy expenditure required for the weight goal.;Weight Management: Provide education and appropriate resources to help participant work on and attain dietary goals.    Admit Weight 152 lb 8 oz (69.2 kg)    Goal Weight: Short Term 150 lb (68 kg)    Goal Weight: Long Term 150 lb (68 kg)    Expected Outcomes Short Term: Continue to assess and modify interventions until short term weight is achieved;Long Term: Adherence to nutrition and physical activity/exercise program aimed toward attainment of established weight goal;Weight Maintenance: Understanding of the daily nutrition guidelines, which includes 25-35% calories from fat, 7% or less cal from saturated fats, less than 200mg  cholesterol, less than 1.5gm of sodium, & 5 or more  servings of fruits and vegetables daily    Improve shortness of breath with ADL's Yes    Intervention Provide education, individualized exercise plan and daily activity instruction to help decrease symptoms of SOB with activities of daily living.    Expected Outcomes Short Term: Improve cardiorespiratory fitness to achieve a reduction of symptoms when performing ADLs;Long Term: Be able to perform more ADLs without symptoms or delay the onset of symptoms    Increase knowledge of respiratory medications and ability to use respiratory devices properly  Yes    Intervention Provide education and demonstration as needed of  appropriate use of medications, inhalers, and oxygen  therapy.    Expected Outcomes Short Term: Achieves understanding of medications use. Understands that oxygen  is a medication prescribed by physician. Demonstrates appropriate use of inhaler and oxygen  therapy.;Long Term: Maintain appropriate use of medications, inhalers, and oxygen  therapy.    Diabetes --   Hx of DM but no meds currently   Hypertension Yes    Intervention Provide education on lifestyle modifcations including regular physical activity/exercise, weight management, moderate sodium restriction and increased consumption of fresh fruit, vegetables, and low fat dairy, alcohol moderation, and smoking cessation.;Monitor prescription use compliance.    Expected Outcomes Long Term: Maintenance of blood pressure at goal levels.;Short Term: Continued assessment and intervention until BP is < 140/33mm HG in hypertensive participants. < 130/34mm HG in hypertensive participants with diabetes, heart failure or chronic kidney disease.    Lipids Yes    Intervention Provide education and support for participant on nutrition & aerobic/resistive exercise along with prescribed medications to achieve LDL 70mg , HDL >40mg .    Expected Outcomes Short Term: Participant states understanding of desired cholesterol values and is compliant with medications prescribed. Participant is following exercise prescription and nutrition guidelines.;Long Term: Cholesterol controlled with medications as prescribed, with individualized exercise RX and with personalized nutrition plan. Value goals: LDL < 70mg , HDL > 40 mg.          Tobacco Use Initial Evaluation: Social History   Tobacco Use  Smoking Status Former   Current packs/day: 0.25   Average packs/day: 0.3 packs/day for 35.0 years (8.8 ttl pk-yrs)   Types: Cigarettes  Smokeless Tobacco Never    Exercise Goals and Review:  Exercise Goals     Row Name 11/16/23 1300             Exercise Goals    Increase Physical Activity Yes       Intervention Provide advice, education, support and counseling about physical activity/exercise needs.;Develop an individualized exercise prescription for aerobic and resistive training based on initial evaluation findings, risk stratification, comorbidities and participant's personal goals.       Expected Outcomes Short Term: Attend rehab on a regular basis to increase amount of physical activity.;Long Term: Exercising regularly at least 3-5 days a week.;Long Term: Add in home exercise to make exercise part of routine and to increase amount of physical activity.       Increase Strength and Stamina Yes       Intervention Provide advice, education, support and counseling about physical activity/exercise needs.;Develop an individualized exercise prescription for aerobic and resistive training based on initial evaluation findings, risk stratification, comorbidities and participant's personal goals.       Expected Outcomes Short Term: Increase workloads from initial exercise prescription for resistance, speed, and METs.;Short Term: Perform resistance training exercises routinely during rehab and add in resistance training at home;Long Term: Improve cardiorespiratory fitness, muscular endurance and strength as measured by  increased METs and functional capacity ( )       Able to understand and use rate of perceived exertion (RPE) scale Yes       Intervention Provide education and explanation on how to use RPE scale       Expected Outcomes Short Term: Able to use RPE daily in rehab to express subjective intensity level;Long Term:  Able to use RPE to guide intensity level when exercising independently       Able to understand and use Dyspnea scale Yes       Intervention Provide education and explanation on how to use Dyspnea scale       Expected Outcomes Short Term: Able to use Dyspnea scale daily in rehab to express subjective sense of shortness of breath during  exertion;Long Term: Able to use Dyspnea scale to guide intensity level when exercising independently       Knowledge and understanding of Target Heart Rate Range (THRR) Yes       Intervention Provide education and explanation of THRR including how the numbers were predicted and where they are located for reference       Expected Outcomes Long Term: Able to use THRR to govern intensity when exercising independently;Short Term: Able to use daily as guideline for intensity in rehab;Short Term: Able to state/look up THRR       Able to check pulse independently Yes       Intervention Provide education and demonstration on how to check pulse in carotid and radial arteries.;Review the importance of being able to check your own pulse for safety during independent exercise       Expected Outcomes Short Term: Able to explain why pulse checking is important during independent exercise;Long Term: Able to check pulse independently and accurately       Understanding of Exercise Prescription Yes       Intervention Provide education, explanation, and written materials on patient's individual exercise prescription       Expected Outcomes Short Term: Able to explain program exercise prescription;Long Term: Able to explain home exercise prescription to exercise independently        Copy of goals given to participant.

## 2023-11-16 NOTE — Progress Notes (Signed)
 Pulmonary Individual Treatment Plan  Patient Details  Name: Mark Schroeder MRN: [id number] Date of Birth: 07/01/1945 Referring Provider:   Flowsheet Row PULMONARY REHAB COPD ORIENTATION from 11/16/2023 in [facility] REHABILIT[facility]  Referring Provider Candyce Caraway MD  [[facility]]    Initial Encounter Date:  Flowsheet Row PULMONARY REHAB COPD ORIENTATION from 11/16/2023 in [facility] CARDIAC REHABILITATION  Date 11/16/23    Visit Diagnosis: Chronic obstructive pulmonary disease, unspecified COPD type (HCC)  Patient's Home Medications on Admission:   Current Outpatient Medications:    acetaminophen  (TYLENOL ) 325 MG tablet, Take 2 tablets (650 mg total) by mouth every 6 (six) hours as needed for mild pain (or Fever >/= 101)., Disp: 12 tablet, Rfl: 1   atorvastatin  (LIPITOR) 40 MG tablet, Take 20 mg by mouth at bedtime., Disp: , Rfl:    azelastine (ASTELIN) 0.1 % nasal spray, Place 2 sprays into both nostrils 2 (two) times daily. Use in each nostril as directed, Disp: , Rfl:    azithromycin  (ZITHROMAX ) 250 MG tablet, Take 250 mg by mouth 3 (three) times a week. M,W,F, Disp: , Rfl:    buPROPion  (ZYBAN ) 150 MG 12 hr tablet, Take 150 mg by mouth daily., Disp: , Rfl:    carvedilol  (COREG ) 25 MG tablet, Take 1 tablet (25 mg total) by mouth 2 (two) times daily with a meal., Disp: 60 tablet, Rfl: 1   cetirizine (ZYRTEC) 10 MG tablet, Take 10 mg by mouth at bedtime., Disp: , Rfl:    Cholecalciferol  (VITAMIN D  PO), Take 1,000 Units by mouth daily., Disp: , Rfl:    divalproex  (DEPAKOTE  ER) 500 MG 24 hr tablet, Take 500 mg by mouth daily., Disp: , Rfl:    finasteride (PROSCAR) 5 MG tablet, Take 5 mg by mouth daily., Disp: , Rfl:    gabapentin  (NEURONTIN ) 300 MG capsule, Take 300 mg by mouth at bedtime., Disp: , Rfl:    guaiFENesin  (ROBITUSSIN) 100 MG/5ML liquid, Take 10 mLs by mouth every 4 (four) hours as needed for cough or to loosen phlegm., Disp: , Rfl:    ipratropium (ATROVENT ) 0.06 % nasal  spray, Place 2 sprays into both nostrils in the morning and at bedtime., Disp: , Rfl:    Melatonin 5 MG TABS, Take 10 mg by mouth at bedtime., Disp: , Rfl:    pantoprazole  (PROTONIX ) 40 MG tablet, Take 40 mg by mouth daily., Disp: , Rfl:    predniSONE  (DELTASONE ) 50 MG tablet, Take 1 tablet (50 mg total) by mouth 2 (two) times daily with a meal., Disp: 14 tablet, Rfl: 0   tamsulosin  (FLOMAX ) 0.4 MG CAPS capsule, Take 0.8 mg by mouth daily after supper., Disp: , Rfl:    albuterol  (PROVENTIL ) (2.5 MG/3ML) 0.083% nebulizer solution, Take 3 mLs (2.5 mg total) by nebulization 3 (three) times daily as needed for wheezing or shortness of breath., Disp: 75 mL, Rfl: 12   amLODipine  (NORVASC ) 10 MG tablet, Take 5 mg by mouth daily., Disp: , Rfl:    aspirin  EC 81 MG tablet, Take 1 tablet (81 mg total) by mouth daily with breakfast., Disp: 30 tablet, Rfl: 2   bacitracin ointment, Apply 1 Application topically 2 (two) times daily as needed (skin abrasion)., Disp: , Rfl:    Budeson-Glycopyrrol-Formoterol  (BREZTRI AEROSPHERE) 160-9-4.8 MCG/ACT AERO, Inhale 2 puffs into the lungs in the morning and at bedtime., Disp: , Rfl:    budesonide -formoterol  (SYMBICORT ) 160-4.5 MCG/ACT inhaler, Inhale 2 puffs into the lungs 2 (two) times daily., Disp: 1 each, Rfl:  12   carboxymethylcellulose (REFRESH PLUS) 0.5 % SOLN, Place 1 drop into both eyes 4 (four) times daily as needed (for dry eyes)., Disp: , Rfl:    Emollient (EUCERIN DAILY HYDRATION) CREA, Apply 1 Application topically in the morning and at bedtime., Disp: , Rfl:    famotidine  (PEPCID ) 40 MG tablet, Take 40 mg by mouth 2 (two) times daily., Disp: , Rfl:    lactobacillus acidophilus (BACID) TABS tablet, Take 1 tablet by mouth daily., Disp: , Rfl:    loratadine  (CLARITIN ) 10 MG tablet, Take 1 tablet (10 mg total) by mouth daily., Disp: 30 tablet, Rfl: 0  Past Medical History: Past Medical History:  Diagnosis Date   COPD (chronic obstructive pulmonary disease)  (HCC)    Depression    Diabetic peripheral neuropathy (HCC)    Hyperlipidemia    Hypertension    PTSD (post-traumatic stress disorder)    Type 2 diabetes mellitus (HCC)     Tobacco Use: Social History   Tobacco Use  Smoking Status Former   Current packs/day: 0.25   Average packs/day: 0.3 packs/day for 35.0 years (8.8 ttl pk-yrs)   Types: Cigarettes  Smokeless Tobacco Never    Labs: Review Flowsheet  More data may exist      Latest Ref Rng & Units 08/15/2014 04/14/2019 08/15/2019 03/05/2020 04/17/2023  Labs for ITP Cardiac and Pulmonary Rehab  Trlycerides <150 mg/dL - - 877  812  -  Hemoglobin A1c 4.8 - 5.6 % 6.7  6.8  6.9  6.7  6.3   PH, Arterial 7.350 - 7.450 - - - 7.362  -  PCO2 arterial 32.0 - 48.0 mmHg - - - 58.9  -  Bicarbonate 20.0 - 28.0 mmol/L - - - 30.0  -  O2 Saturation % - - - 96.2  -    Capillary Blood Glucose: Lab Results  Component Value Date   GLUCAP 185 (H) 04/18/2023   GLUCAP 218 (H) 04/17/2023   GLUCAP 133 (H) 04/17/2023   GLUCAP 117 (H) 04/17/2023   GLUCAP 83 04/17/2023     Pulmonary Assessment Scores:  Pulmonary Assessment Scores     Row Name 11/16/23 1456         ADL UCSD   ADL Phase Entry     SOB Score total 94     Rest 2     Walk 3     Stairs 5     Bath 4     Dress 4     Shop 4       CAT Score   CAT Score 18       mMRC Score   mMRC Score 4       UCSD: Self-administered rating of dyspnea associated with activities of daily living (ADLs) 6-point scale (0 = not at all to 5 = maximal or unable to do because of breathlessness)  Scoring Scores range from 0 to 120.  Minimally important difference is 5 units  CAT: CAT can identify the health impairment of COPD patients and is better correlated with disease progression.  CAT has a scoring range of zero to 40. The CAT score is classified into four groups of low (less than 10), medium (10 - 20), high (21-30) and very high (31-40) based on the impact level of disease on health  status. A CAT score over 10 suggests significant symptoms.  A worsening CAT score could be explained by an exacerbation, poor medication adherence, poor inhaler technique, or progression of COPD or comorbid conditions.  CAT MCID is 2 points  mMRC: mMRC (Modified Medical Research Council) Dyspnea Scale is used to assess the degree of baseline functional disability in patients of respiratory disease due to dyspnea. No minimal important difference is established. A decrease in score of 1 point or greater is considered a positive change.   Pulmonary Function Assessment:  Pulmonary Function Assessment - 11/16/23 1455       Pulmonary Function Tests   FVC% 70 %   08/06/15   FEV1% 40 %    FEV1/FVC Ratio 45    RV% 124 %    DLCO% 55 %      Breath   Bilateral Breath Sounds Decreased;Wheezes;Inspiratory;Expiratory    Shortness of Breath Yes;Fear of Shortness of Breath;Limiting activity;Panic with Shortness of Breath          Exercise Target Goals: Exercise Program Goal: Individual exercise prescription set using results from initial 6 min walk test and THRR while considering  patient's activity barriers and safety.   Exercise Prescription Goal: Initial exercise prescription builds to 30-45 minutes a day of aerobic activity, 2-3 days per week.  Home exercise guidelines will be given to patient during program as part of exercise prescription that the participant will acknowledge.  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 HR 102 bpm     Max Ex. BP 136/64     2 Minute Post BP 128/62       Interval HR   1 Minute HR 101     2 Minute HR 92     3 Minute HR 102     4 Minute HR 99     5 Minute HR 98     6 Minute HR 102     2 Minute Post HR 77     Interval Heart Rate? Yes       Interval Oxygen    Interval Oxygen ? Yes     Baseline Oxygen  Saturation % 97 %     1 Minute Oxygen  Saturation % 93 %     1 Minute Liters of Oxygen  0 L  Room Air     2 Minute Oxygen  Saturation % 93 %     2 Minute Liters of Oxygen  0 L  3 Minute Oxygen  Saturation % 91 %     3 Minute Liters of Oxygen  0 L     4 Minute Oxygen  Saturation % 89 %     4 Minute Liters of Oxygen  0 L     5 Minute Oxygen  Saturation % 89 %     5 Minute Liters of Oxygen  0 L     6 Minute Oxygen  Saturation % 87 %     6 Minute Liters of Oxygen  0 L     2 Minute Post Oxygen  Saturation % 96 %     2 Minute Post Liters of Oxygen  0 L        Oxygen  Initial Assessment:  Oxygen  Initial Assessment - 11/16/23 1455       Home Oxygen    Home Oxygen  Device None    Sleep Oxygen  Prescription None    Home Exercise Oxygen  Prescription None    Home Resting Oxygen  Prescription None      Initial 6 min Walk   Oxygen  Used None      Program Oxygen  Prescription   Program Oxygen  Prescription None      Intervention   Short Term Goals To learn and demonstrate proper use of respiratory medications;To learn and understand importance of maintaining oxygen  saturations>88%;To learn and demonstrate proper pursed lip breathing techniques or other breathing  techniques. ;To learn and understand importance of monitoring SPO2 with pulse oximeter and demonstrate accurate use of the pulse oximeter.    Long  Term Goals Maintenance of O2 saturations>88%;Compliance with respiratory medication;Exhibits proper breathing techniques, such as pursed lip breathing or other method taught during program session;Verbalizes importance of monitoring SPO2 with pulse oximeter and return demonstration;Demonstrates proper use of MDI's          Oxygen  Re-Evaluation:   Oxygen  Discharge (Final Oxygen  Re-Evaluation):   Initial Exercise Prescription:  Initial Exercise Prescription - 11/16/23 1400       Date of Initial Exercise RX and Referring Provider   Date 11/16/23    Referring Provider Candyce Caraway MD   [facility]     Oxygen    Maintain Oxygen  Saturation 88% or higher      Treadmill   MPH 1.2    Grade 0.5    Minutes 15    METs 2      NuStep   Level 1    SPM 80    Minutes 15    METs 2      Prescription Details   Frequency (times per week) 3    Duration Progress to 30 minutes of continuous aerobic without signs/symptoms of physical distress      Intensity   THRR 40-80% of Max Heartrate 97-128    Ratings of Perceived Exertion 11-13    Perceived Dyspnea 0-4      Progression   Progression Continue to progress workloads to maintain intensity without signs/symptoms of physical distress.      Resistance Training   Training Prescription Yes    Weight 4 lb    Reps 10-15          Perform Capillary Blood Glucose checks as needed.  Exercise Prescription Changes:   Exercise Prescription Changes     Row Name 11/16/23 1400             Response to Exercise   Blood Pressure (Admit) 126/74       Blood Pressure (Exercise) 136/64       Blood Pressure (Exit) 112/70       Heart Rate (Admit)  65 bpm       Heart Rate (Exercise) 102 bpm       Heart Rate (Exit) 71 bpm       Oxygen  Saturation (Admit) 97 %       Oxygen  Saturation (Exercise) 87 %        Oxygen  Saturation (Exit) 96 %       Rating of Perceived Exertion (Exercise) 14       Perceived Dyspnea (Exercise) 2       Symptoms SOB, leg pain 4/10 (calf)       Comments walk test results          Exercise Comments:   Exercise Comments     Row Name 11/16/23 1437           Exercise Comments 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       Expected Outcomes Short Term: Attend rehab on a regular basis to increase amount of physical activity.;Long Term: Exercising regularly at least 3-5 days a week.;Long Term: Add in home exercise to make exercise part of routine and to increase amount of physical activity.       Increase Strength and Stamina Yes       Intervention Provide advice, education, support and counseling about physical activity/exercise needs.;Develop an individualized exercise prescription for aerobic and resistive training based on initial evaluation findings, risk stratification, comorbidities and participant's personal goals.       Expected Outcomes Short Term: Increase workloads from initial exercise prescription for resistance, speed, and METs.;Short Term: Perform resistance training exercises routinely during rehab and add in resistance training at home;Long Term: Improve cardiorespiratory fitness, muscular endurance and strength as measured by increased METs and functional capacity ( )       Able to understand and use rate of perceived exertion (RPE) scale Yes       Intervention Provide education and explanation on how to use  RPE scale       Expected Outcomes Short Term: Able to use RPE daily in rehab to express subjective intensity level;Long Term:  Able to use RPE to guide intensity level when exercising independently       Able to understand and use Dyspnea scale Yes       Intervention Provide education and explanation on how to use Dyspnea scale       Expected Outcomes Short Term: Able to use Dyspnea scale daily in rehab to express subjective sense of shortness of breath during exertion;Long Term: Able to use Dyspnea scale to guide intensity level when exercising independently       Knowledge and understanding of Target Heart Rate Range (THRR) Yes       Intervention Provide education and explanation of THRR including how the numbers were predicted and where they are located for reference       Expected Outcomes Long Term: Able to use THRR to govern intensity when exercising independently;Short Term: Able to use daily as guideline for intensity in rehab;Short Term: Able to state/look up  THRR       Able to check pulse independently Yes       Intervention Provide education and demonstration on how to check pulse in carotid and radial arteries.;Review the importance of being able to check your own pulse for safety during independent exercise       Expected Outcomes Short Term: Able to explain why pulse checking is important during independent exercise;Long Term: Able to check pulse independently and accurately       Understanding of Exercise Prescription Yes       Intervention Provide education, explanation, and written materials on patient's individual exercise prescription       Expected Outcomes Short Term: Able to explain program exercise prescription;Long Term: Able to explain home exercise prescription to exercise independently          Exercise Goals Re-Evaluation :   Discharge Exercise Prescription (Final Exercise Prescription Changes):  Exercise Prescription Changes - 11/16/23 1400       Response to  Exercise   Blood Pressure (Admit) 126/74    Blood Pressure (Exercise) 136/64    Blood Pressure (Exit) 112/70    Heart Rate (Admit) 65 bpm    Heart Rate (Exercise) 102 bpm    Heart Rate (Exit) 71 bpm    Oxygen  Saturation (Admit) 97 %    Oxygen  Saturation (Exercise) 87 %    Oxygen  Saturation (Exit) 96 %    Rating of Perceived Exertion (Exercise) 14    Perceived Dyspnea (Exercise) 2    Symptoms SOB, leg pain 4/10 (calf)    Comments walk test results          Nutrition:  Target Goals: Understanding of nutrition guidelines, daily intake of sodium 1500mg , cholesterol 200mg , calories 30% from fat and 7% or less from saturated fats, daily to have 5 or more servings of fruits and vegetables.  Biometrics:  Pre Biometrics - 11/16/23 1438       Pre Biometrics   Height 5' 2 (1.575 m)    Weight 69.2 kg    Waist Circumference 35 inches    Hip Circumference 37.5 inches    Waist to Hip Ratio 0.93 %    BMI (Calculated) 27.89    Grip Strength 16.4 kg    Flexibility 0 in    Single Leg Stand 7.3 seconds           Nutrition Therapy Plan and Nutrition Goals:  Nutrition Therapy & Goals - 11/16/23 1453       Intervention Plan   Intervention Prescribe, educate and counsel regarding individualized specific dietary modifications aiming towards targeted core components such as weight, hypertension, lipid management, diabetes, heart failure and other comorbidities.;Nutrition handout(s) given to patient.    Expected Outcomes Short Term Goal: Understand basic principles of dietary content, such as calories, fat, sodium, cholesterol and nutrients.;Long Term Goal: Adherence to prescribed nutrition plan.          Nutrition Assessments:  MEDIFICTS Score Key: >=70 Need to make dietary changes  40-70 Heart Healthy Diet <= 40 Therapeutic Level Cholesterol Diet  Flowsheet Row PULMONARY REHAB COPD ORIENTATION from 11/16/2023 in [facility] CARDIAC REH[facility]ILITATION  Picture Your Plate Total Score  on Admission 46   Picture Your Plate Scores: <59 Unhealthy dietary pattern with much room for improvement. 41-50 Dietary pattern unlikely to meet recommendations for good health and room for improvement. 51-60 More healthful dietary pattern, with some room for improvement.  >60 Healthy dietary pattern, although there may be some specific behaviors that could  be improved.    Nutrition Goals Re-Evaluation:   Nutrition Goals Discharge (Final Nutrition Goals Re-Evaluation):   Psychosocial: Target Goals: Acknowledge presence or absence of significant depression and/or stress, maximize coping skills, provide positive support system. Participant is able to verbalize types and ability to use techniques and skills needed for reducing stress and depression.  Initial Review & Psychosocial Screening:  Initial Psych Review & Screening - 11/16/23 1303       Initial Review   Current issues with History of Depression;Current Depression;Current Stress Concerns;Current Psychotropic Meds;Current Sleep Concerns    Source of Stress Concerns Family;Chronic Illness;Unable to participate in former interests or hobbies;Unable to perform yard/household activities    Comments granddaughter 33 that likes to push his buttons, worries about his wife's health as it declining (stage III kidney disease and SOB), trouble sleeping [facility] [facility] on buproion helps some      Family Dynamics   Good Support System? Yes   wife, daughter   Comments wife drives him back and forth      Barriers   Psychosocial barriers to participate in program Psychosocial barriers identified (see note);The patient should benefit from training in stress management and relaxation.      Screening Interventions   Interventions Encouraged to exercise;To provide support and resources with identified psychosocial needs;Provide feedback about the scores to participant    Expected Outcomes Short Term goal: Utilizing psychosocial  counselor, staff and physician to assist with identification of specific Stressors or current issues interfering with healing process. Setting desired goal for each stressor or current issue identified.;Long Term Goal: Stressors or current issues are controlled or eliminated.;Short Term goal: Identification and review with participant of any Quality of Life or Depression concerns found by scoring the questionnaire.;Long Term goal: The participant improves quality of Life and PHQ9 Scores as seen by post scores and/or verbalization of changes          Quality of Life Scores:  Scores of 19 and below usually indicate a poorer quality of life in these areas.  A difference of  2-3 points is a clinically meaningful difference.  A difference of 2-3 points in the total score of the Quality of Life Index has been associated with significant improvement in overall quality of life, self-image, physical symptoms, and general health in studies assessing change in quality of life.  PHQ-9: Review Flowsheet       11/16/2023 12/07/2012 11/21/2012  Depression screen PHQ 2/9  Decreased Interest 3 0 0  Down, Depressed, Hopeless 2 0 1  PHQ - 2 Score 5 0 1  Altered sleeping 3 - -  Tired, decreased energy 2 - -  Change in appetite 3 - -  Feeling bad or failure about yourself  2 - -  Trouble concentrating 3 - -  Moving slowly or fidgety/restless 3 - -  Suicidal thoughts 0 - -  PHQ-9 Score 21 - -  Difficult doing work/chores Very difficult - -   Interpretation of Total Score  Total Score Depression Severity:  1-4 = Minimal depression, 5-9 = Mild depression, 10-14 = Moderate depression, 15-19 = Moderately severe depression, 20-27 = Severe depression   Psychosocial Evaluation and Intervention:  Psychosocial Evaluation - 11/16/23 1438       Psychosocial Evaluation & Interventions   Interventions Stress management education;Relaxation education;Encouraged to exercise with the program and follow exercise  prescription    Comments Mark Schroeder is coming into pulmonary rehab for COPD through the [facility].  He did the program  previously in 2014.  He is looking forward to attending.  He really wants to work on his breathing and his mobility.  He is also hoping to be able to work on getting back to singing again as he misses singing in the church choir.  He lives at home with his wife who also has significant health issues.  He worries about her health as she has stage III kidney disease that went undiagnosised for 3 years.  He also has a granddaughter that stays with them on occassion and likes to push her limits.  She is 13 and he says shes going on 34!  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 He does see a councelor monthly through the [facility] and is on bipropion for this. He says it helps some but not completely.  His wife usually drives him to his appointments but also goes with their daughter and her son to appointments.  So appointment schedules could cause some attendance conflicts, but other wise he does not see any issues getting to rehab as long as calendar is free.  He has two dogs, a rotwiller and boxer, that he enjoys walking (or being dragged by) and spending time with them.  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    Expected Outcomes short: Attend rehab to build stamina LOng: Work on improving his breathing    Continue Psychosocial Services  Follow up required by staff          Psychosocial Re-Evaluation:   Psychosocial Discharge (Final Psychosocial Re-Evaluation):   Education: Education Goals: Education classes will be provided on a weekly basis, covering required topics. Participant will state understanding/return demonstration of topics presented.  Learning Barriers/Preferences:  Learning Barriers/Preferences - 11/16/23 1454       Learning Barriers/Preferences   Learning Barriers None    Learning Preferences None           Education Topics: Know Your Numbers Group instruction that is supported by a PowerPoint presentation. Instructor discusses importance of knowing and understanding resting, exercise, and post-exercise oxygen  saturation, heart rate, and blood pressure. Oxygen  saturation, heart rate, blood pressure, rating of perceived exertion, and dyspnea are reviewed along with a normal range for these values.    Exercise for the Pulmonary Patient Group instruction that is supported by a PowerPoint presentation. Instructor discusses benefits of exercise, core components of exercise, frequency, duration, and intensity of an exercise routine, importance of utilizing pulse oximetry during exercise, safety while exercising, and options of places to exercise outside of rehab.    MET Level  Group instruction provided by PowerPoint, verbal discussion, and written material to support subject matter. Instructor reviews what METs are and how to increase METs.    Pulmonary Medications Verbally interactive group education provided by instructor with focus on inhaled medications and proper administration.   Anatomy and Physiology of the Respiratory System Group instruction provided by PowerPoint, verbal discussion, and written material to support subject matter. Instructor reviews respiratory cycle and anatomical components of the respiratory system and their functions. Instructor also reviews differences in obstructive and restrictive respiratory diseases with examples of each.    Oxygen  Safety Group instruction provided by PowerPoint, verbal discussion, and written material to support subject matter. There is an overview of "What is Oxygen " and "Why do we need it".  Instructor also reviews how to create a safe environment for oxygen  use, the importance of using oxygen   as prescribed, and the risks of noncompliance. There is a brief discussion on traveling with oxygen  and resources the patient may utilize.   Oxygen   Use Group instruction provided by PowerPoint, verbal discussion, and written material to discuss how supplemental oxygen  is prescribed and different types of oxygen  supply systems. Resources for more information are provided.    Breathing Techniques Group instruction that is supported by demonstration and informational handouts. Instructor discusses the benefits of pursed lip and diaphragmatic breathing and detailed demonstration on how to perform both.     Risk Factor Reduction Group instruction that is supported by a PowerPoint presentation. Instructor discusses the definition of a risk factor, different risk factors for pulmonary disease, and how the heart and lungs work together.   Pulmonary Diseases Group instruction provided by PowerPoint, verbal discussion, and written material to support subject matter. Instructor gives an overview of the different type of pulmonary diseases. There is also a discussion on risk factors and symptoms as well as ways to manage the diseases.   Stress and Energy Conservation Group instruction provided by PowerPoint, verbal discussion, and written material to support subject matter. Instructor gives an overview of stress and the impact it can have on the body. Instructor also reviews ways to reduce stress. There is also a discussion on energy conservation and ways to conserve energy throughout the day.   Warning Signs and Symptoms Group instruction provided by PowerPoint, verbal discussion, and written material to support subject matter. Instructor reviews warning signs and symptoms of stroke, heart attack, cold and flu. Instructor also reviews ways to prevent the spread of infection.   Other Education Group or individual verbal, written, or video instructions that support the educational goals of the pulmonary rehab program.    Knowledge Questionnaire Score:  Knowledge Questionnaire Score - 11/16/23 1454       Knowledge Questionnaire Score   Pre  Score 15/18          Core Components/Risk Factors/Patient Goals at Admission:  Personal Goals and Risk Factors at Admission - 11/16/23 1454       Core Components/Risk Factors/Patient Goals on Admission    Weight Management Yes;Weight Maintenance    Intervention Weight Management: Develop a combined nutrition and exercise program designed to reach desired caloric intake, while maintaining appropriate intake of nutrient and fiber, sodium and fats, and appropriate energy expenditure required for the weight goal.;Weight Management: Provide education and appropriate resources to help participant work on and attain dietary goals.    Admit Weight 152 lb 8 oz (69.2 kg)    Goal Weight: Short Term 150 lb (68 kg)    Goal Weight: Long Term 150 lb (68 kg)    Expected Outcomes Short Term: Continue to assess and modify interventions until short term weight is achieved;Long Term: Adherence to nutrition and physical activity/exercise program aimed toward attainment of established weight goal;Weight Maintenance: Understanding of the daily nutrition guidelines, which includes 25-35% calories from fat, 7% or less cal from saturated fats, less than 200mg  cholesterol, less than 1.5gm of sodium, & 5 or more servings of fruits and vegetables daily    Improve shortness of breath with ADL's Yes    Intervention Provide education, individualized exercise plan and daily activity instruction to help decrease symptoms of SOB with activities of daily living.    Expected Outcomes Short Term: Improve cardiorespiratory fitness to achieve a reduction of symptoms when performing ADLs;Long Term: Be able to perform more ADLs without symptoms or delay the onset of symptoms  Increase knowledge of respiratory medications and ability to use respiratory devices properly  Yes    Intervention Provide education and demonstration as needed of appropriate use of medications, inhalers, and oxygen  therapy.    Expected Outcomes Short Term:  Achieves understanding of medications use. Understands that oxygen  is a medication prescribed by physician. Demonstrates appropriate use of inhaler and oxygen  therapy.;Long Term: Maintain appropriate use of medications, inhalers, and oxygen  therapy.    Diabetes --   Hx of DM but no meds currently   Hypertension Yes    Intervention Provide education on lifestyle modifcations including regular physical activity/exercise, weight management, moderate sodium restriction and increased consumption of fresh fruit, vegetables, and low fat dairy, alcohol moderation, and smoking cessation.;Monitor prescription use compliance.    Expected Outcomes Long Term: Maintenance of blood pressure at goal levels.;Short Term: Continued assessment and intervention until BP is < 140/53mm HG in hypertensive participants. < 130/23mm HG in hypertensive participants with diabetes, heart failure or chronic kidney disease.    Lipids Yes    Intervention Provide education and support for participant on nutrition & aerobic/resistive exercise along with prescribed medications to achieve LDL 70mg , HDL >40mg .    Expected Outcomes Short Term: Participant states understanding of desired cholesterol values and is compliant with medications prescribed. Participant is following exercise prescription and nutrition guidelines.;Long Term: Cholesterol controlled with medications as prescribed, with individualized exercise RX and with personalized nutrition plan. Value goals: LDL < 70mg , HDL > 40 mg.          Core Components/Risk Factors/Patient Goals Review:    Core Components/Risk Factors/Patient Goals at Discharge (Final Review):    ITP Comments:  ITP Comments     Row Name 11/16/23 1433           ITP Comments Patient attend orientation today.  Patient is attending Pulmonary Rehabilitation Program.  Documentation for diagnosis can be found in scanned into media from [place].  Reviewed medical chart, RPE/RPD, gym safety, and program  guidelines.  Patient was fitted to equipment they will be using during rehab.  Patient is scheduled to start exercise on Friday 11/18/23 at 1pm.   Initial ITP created and sent for review and signature by Dr. Anton Kelp, Medical Director for Pulmonary Rehabilitation Program.          Comments: Initial ITP

## 2023-11-18 ENCOUNTER — Encounter (HOSPITAL_COMMUNITY)
Admission: RE | Admit: 2023-11-18 | Discharge: 2023-11-18 | Disposition: A | Discharge status: Home / Self Care | Source: Ambulatory Visit

## 2023-11-18 DIAGNOSIS — J449 Chronic obstructive pulmonary disease, unspecified: Secondary | ICD-10-CM | POA: Diagnosis present

## 2023-11-18 NOTE — Progress Notes (Signed)
 Daily Session Note  Patient Details  Name: Mark Schroeder MRN: [id number] Date of Birth: February 23, 1945 Referring Provider:   Flowsheet Row PULMONARY REHAB COPD ORIENTATION from 11/16/2023 in [facility] CARDIAC REHABILITATION  Referring Provider Candyce Caraway MD  [[place]]    Encounter Date: 11/18/2023  Check In:  Session Check In - 11/18/23 1255       Check-In   Supervising physician immediately available to respond to emergencies See telemetry face sheet for immediately available MD    Location [facility]    Staff Present Richerd Buddle, RN;Heather Con, [place], Exercise Physiologist;Vencent Hauschild Jackquline, BSN, RN, WTA-C    Virtual Visit No    Medication changes reported     No    Fall or balance concerns reported    No    Tobacco Cessation No Change    Warm-up and Cool-down Performed on first and last piece of equipment    Resistance Training Performed Yes    VAD Patient? No    PAD/SET Patient? No      Pain Assessment   Currently in Pain? No/denies          Capillary Blood Glucose: No results found for this or any previous visit (from the past 24 hours).    Social History   Tobacco Use  Smoking Status Former   Current packs/day: 0.25   Average packs/day: 0.3 packs/day for 35.0 years (8.8 ttl pk-yrs)   Types: Cigarettes  Smokeless Tobacco Never    Goals Met:  Proper associated with RPD/PD & O2 Sat Independence with exercise equipment Improved SOB with ADL's Using PLB without cueing & demonstrates good technique Exercise tolerated well No report of concerns or symptoms today Strength training completed today  Goals Unmet:  Not Applicable  Comments: First full day of exercise!  Patient was oriented to gym and equipment including functions, settings, policies, and procedures.  Patient's individual exercise prescription and treatment plan were reviewed.  All starting workloads were established based on the results of the 6 minute walk test done at  initial orientation visit.  The plan for exercise progression was also introduced and progression will be customized based on patient's performance and goals.

## 2023-11-21 ENCOUNTER — Encounter (HOSPITAL_COMMUNITY)

## 2023-11-23 ENCOUNTER — Encounter (HOSPITAL_COMMUNITY)
Admission: RE | Admit: 2023-11-23 | Discharge: 2023-11-23 | Disposition: A | Discharge status: Home / Self Care | Source: Ambulatory Visit

## 2023-11-23 DIAGNOSIS — J449 Chronic obstructive pulmonary disease, unspecified: Secondary | ICD-10-CM | POA: Diagnosis present

## 2023-11-23 NOTE — Progress Notes (Signed)
 Daily Session Note  Patient Details  Name: Mark Schroeder MRN: [id number] Date of Birth: 1945-10-07 Referring Provider:   Flowsheet Row PULMONARY REHAB COPD ORIENTATION from 11/16/2023 in [facility] CARDIAC REHABILITATION  Referring Provider Candyce Caraway MD  [[place]]    Encounter Date: 11/23/2023  Check In:  Session Check In - 11/23/23 1425       Check-In   Supervising physician immediately available to respond to emergencies See telemetry face sheet for immediately available MD    Location [facility]    Staff Present Powell Benders, BS, Exercise Physiologist;Leverett Camplin Marana BSN, RN;Debra Vicci, RN, BSN;Jessica Hawkins, MA, RCEP, CCRP, CCET    Virtual Visit No    Medication changes reported     Yes    Comments Pt was started on Amlodipine  10 mg daily    Fall or balance concerns reported    No    Tobacco Cessation No Change    Warm-up and Cool-down Performed on first and last piece of equipment    Resistance Training Performed Yes    VAD Patient? No    PAD/SET Patient? No      Pain Assessment   Currently in Pain? No/denies    Multiple Pain Sites No          Capillary Blood Glucose: No results found for this or any previous visit (from the past 24 hours).    Social History   Tobacco Use  Smoking Status Former   Current packs/day: 0.25   Average packs/day: 0.3 packs/day for 35.0 years (8.8 ttl pk-yrs)   Types: Cigarettes  Smokeless Tobacco Never    Goals Met:  Proper associated with RPD/PD & O2 Sat Independence with exercise equipment Using PLB without cueing & demonstrates good technique Exercise tolerated well Queuing for purse lip breathing No report of concerns or symptoms today Strength training completed today  Goals Unmet:  Not Applicable  Comments: SABRASABRAPt able to follow exercise prescription today without complaint.  Will continue to monitor for progression.

## 2023-11-25 ENCOUNTER — Encounter (HOSPITAL_COMMUNITY)

## 2023-11-28 ENCOUNTER — Telehealth (HOSPITAL_COMMUNITY): Payer: Self-pay

## 2023-11-28 ENCOUNTER — Encounter (HOSPITAL_COMMUNITY)

## 2023-11-29 ENCOUNTER — Encounter (HOSPITAL_COMMUNITY)
Admission: RE | Admit: 2023-11-29 | Discharge: 2023-11-29 | Disposition: A | Discharge status: Home / Self Care | Source: Ambulatory Visit

## 2023-11-29 DIAGNOSIS — J449 Chronic obstructive pulmonary disease, unspecified: Secondary | ICD-10-CM | POA: Diagnosis not present

## 2023-11-29 NOTE — Progress Notes (Signed)
 Daily Session Note  Patient Details  Name: Mark Schroeder MRN: [id number] Date of Birth: [birth year] Referring Provider:   Flowsheet Row PULMONARY REHAB COPD ORIENTATION from 11/16/2023 in [facility] CARDIAC REHABILITATION  Referring Provider Candyce Caraway MD  [[place]]    Encounter Date: 11/29/2023  Check In:  Session Check In - 11/29/23 1333       Check-In   Supervising physician immediately available to respond to emergencies See telemetry face sheet for immediately available MD    Location [facility]    Staff Present Danney Daring, BS, RRT, CPFT;Jessica Vonzell, MA, RCEP, CCRP, CCET    Virtual Visit No    Medication changes reported     No    Fall or balance concerns reported    No    Tobacco Cessation No Change    Warm-up and Cool-down Performed on first and last piece of equipment    Resistance Training Performed Yes    VAD Patient? No    PAD/SET Patient? No      Pain Assessment   Currently in Pain? No/denies          Capillary Blood Glucose: No results found for this or any previous visit (from the past 24 hours).    Social History   Tobacco Use  Smoking Status Former   Current packs/day: 0.25   Average packs/day: 0.3 packs/day for 35.0 years (8.8 ttl pk-yrs)   Types: Cigarettes  Smokeless Tobacco Never    Goals Met:  Proper associated with RPD/PD & O2 Sat Independence with exercise equipment Improved SOB with ADL's Using PLB without cueing & demonstrates good technique Exercise tolerated well No report of concerns or symptoms today Strength training completed today  Goals Unmet:  Not Applicable  Comments: Pt able to follow exercise prescription today without complaint.  Will continue to monitor for progression.

## 2023-11-30 ENCOUNTER — Encounter (HOSPITAL_COMMUNITY): Payer: Self-pay | Admitting: *Deleted

## 2023-11-30 ENCOUNTER — Encounter (HOSPITAL_COMMUNITY)
Admission: RE | Admit: 2023-11-30 | Discharge: 2023-11-30 | Disposition: A | Discharge status: Home / Self Care | Source: Ambulatory Visit

## 2023-11-30 DIAGNOSIS — J449 Chronic obstructive pulmonary disease, unspecified: Secondary | ICD-10-CM

## 2023-11-30 NOTE — Progress Notes (Signed)
 Daily Session Note  Patient Details  Name: Mark Schroeder MRN: [id number] Date of Birth: 19-Jan-1946 Referring Provider:   Flowsheet Row PULMONARY REHAB COPD ORIENTATION from 11/16/2023 in [facility] CARDIAC REHABILITATION  Referring Provider Candyce Caraway MD  [[place]]    Encounter Date: 11/30/2023  Check In:  Session Check In - 11/30/23 1407       Check-In   Supervising physician immediately available to respond to emergencies See telemetry face sheet for immediately available MD    Location [facility]    Staff Present Laymon Rattler, BSN, RN, WTA-C;Hillary Troutman BSN, RN;Heather Con, BS, Exercise Physiologist    Virtual Visit No    Medication changes reported     No    Fall or balance concerns reported    No    Tobacco Cessation No Change    Warm-up and Cool-down Performed on first and last piece of equipment    Resistance Training Performed Yes    VAD Patient? No    PAD/SET Patient? No      Pain Assessment   Currently in Pain? No/denies          Capillary Blood Glucose: No results found for this or any previous visit (from the past 24 hours).    Social History   Tobacco Use  Smoking Status Former   Current packs/day: 0.25   Average packs/day: 0.3 packs/day for 35.0 years (8.8 ttl pk-yrs)   Types: Cigarettes  Smokeless Tobacco Never    Goals Met:  Proper associated with RPD/PD & O2 Sat Independence with exercise equipment Improved SOB with ADL's Using PLB without cueing & demonstrates good technique Exercise tolerated well No report of concerns or symptoms today Strength training completed today  Goals Unmet:  Not Applicable  Comments: Pt able to follow exercise prescription today without complaint.  Will continue to monitor for progression.

## 2023-11-30 NOTE — Progress Notes (Signed)
 Pulmonary Individual Treatment Plan  Patient Details  Name: Mark Schroeder MRN: [id number] Date of Birth: 05/27/1945 Referring Provider:   Flowsheet Row PULMONARY REHAB COPD ORIENTATION from 11/16/2023 in [facility]ARD[facility] REH[facility]ILITATION  Referring Provider Candyce Caraway MD  [[facility]]    Initial Encounter Date:  Flowsheet Row PULMONARY REHAB COPD ORIENTATION from 11/16/2023 in [facility] CARDIAC REHABILITATION  Date 11/16/23    Visit Diagnosis: Chronic obstructive pulmonary disease, unspecified COPD type (HCC)  Patient's Home Medications on Admission:   Current Outpatient Medications:    acetaminophen  (TYLENOL ) 325 MG tablet, Take 2 tablets (650 mg total) by mouth every 6 (six) hours as needed for mild pain (or Fever >/= 101)., Disp: 12 tablet, Rfl: 1   albuterol  (PROVENTIL ) (2.5 MG/3ML) 0.083% nebulizer solution, Take 3 mLs (2.5 mg total) by nebulization 3 (three) times daily as needed for wheezing or shortness of breath., Disp: 75 mL, Rfl: 12   amLODipine  (NORVASC ) 10 MG tablet, Take 5 mg by mouth daily., Disp: , Rfl:    aspirin  EC 81 MG tablet, Take 1 tablet (81 mg total) by mouth daily with breakfast., Disp: 30 tablet, Rfl: 2   atorvastatin  (LIPITOR) 40 MG tablet, Take 20 mg by mouth at bedtime., Disp: , Rfl:    azelastine (ASTELIN) 0.1 % nasal spray, Place 2 sprays into both nostrils 2 (two) times daily. Use in each nostril as directed, Disp: , Rfl:    azithromycin  (ZITHROMAX ) 250 MG tablet, Take 250 mg by mouth 3 (three) times a week. M,W,F, Disp: , Rfl:    bacitracin ointment, Apply 1 Application topically 2 (two) times daily as needed (skin abrasion)., Disp: , Rfl:    Budeson-Glycopyrrol-Formoterol  (BREZTRI AEROSPHERE) 160-9-4.8 MCG/ACT AERO, Inhale 2 puffs into the lungs in the morning and at bedtime., Disp: , Rfl:    budesonide -formoterol  (SYMBICORT ) 160-4.5 MCG/ACT inhaler, Inhale 2 puffs into the lungs 2 (two) times daily., Disp: 1 each, Rfl: 12   buPROPion  (ZYBAN ) 150 MG 12 hr  tablet, Take 150 mg by mouth daily., Disp: , Rfl:    carboxymethylcellulose (REFRESH PLUS) 0.5 % SOLN, Place 1 drop into both eyes 4 (four) times daily as needed (for dry eyes)., Disp: , Rfl:    carvedilol  (COREG ) 25 MG tablet, Take 1 tablet (25 mg total) by mouth 2 (two) times daily with a meal., Disp: 60 tablet, Rfl: 1   cetirizine (ZYRTEC) 10 MG tablet, Take 10 mg by mouth at bedtime., Disp: , Rfl:    Cholecalciferol  (VITAMIN D  PO), Take 1,000 Units by mouth daily., Disp: , Rfl:    divalproex  (DEPAKOTE  ER) 500 MG 24 hr tablet, Take 500 mg by mouth daily., Disp: , Rfl:    Emollient (EUCERIN DAILY HYDRATION) CREA, Apply 1 Application topically in the morning and at bedtime., Disp: , Rfl:    famotidine  (PEPCID ) 40 MG tablet, Take 40 mg by mouth 2 (two) times daily., Disp: , Rfl:    finasteride (PROSCAR) 5 MG tablet, Take 5 mg by mouth daily., Disp: , Rfl:    gabapentin  (NEURONTIN ) 300 MG capsule, Take 300 mg by mouth at bedtime., Disp: , Rfl:    guaiFENesin  (ROBITUSSIN) 100 MG/5ML liquid, Take 10 mLs by mouth every 4 (four) hours as needed for cough or to loosen phlegm., Disp: , Rfl:    ipratropium (ATROVENT ) 0.06 % nasal spray, Place 2 sprays into both nostrils in the morning and at bedtime., Disp: , Rfl:    lactobacillus acidophilus (BACID) TABS tablet, Take 1 tablet by mouth  daily., Disp: , Rfl:    loratadine  (CLARITIN ) 10 MG tablet, Take 1 tablet (10 mg total) by mouth daily., Disp: 30 tablet, Rfl: 0   Melatonin 5 MG TABS, Take 10 mg by mouth at bedtime., Disp: , Rfl:    pantoprazole  (PROTONIX ) 40 MG tablet, Take 40 mg by mouth daily., Disp: , Rfl:    predniSONE  (DELTASONE ) 50 MG tablet, Take 1 tablet (50 mg total) by mouth 2 (two) times daily with a meal., Disp: 14 tablet, Rfl: 0   tamsulosin  (FLOMAX ) 0.4 MG CAPS capsule, Take 0.8 mg by mouth daily after supper., Disp: , Rfl:   Past Medical History: Past Medical History:  Diagnosis Date   COPD (chronic obstructive pulmonary disease) (HCC)     Depression    Diabetic peripheral neuropathy (HCC)    Hyperlipidemia    Hypertension    PTSD (post-traumatic stress disorder)    Type 2 diabetes mellitus (HCC)     Tobacco Use: Social History   Tobacco Use  Smoking Status Former   Current packs/day: 0.25   Average packs/day: 0.3 packs/day for 35.0 years (8.8 ttl pk-yrs)   Types: Cigarettes  Smokeless Tobacco Never    Labs: Review Flowsheet  More data may exist      Latest Ref Rng & Units 08/15/2014 04/14/2019 08/15/2019 03/05/2020 04/17/2023  Labs for ITP Cardiac and Pulmonary Rehab  Trlycerides <150 mg/dL - - 877  812  -  Hemoglobin A1c 4.8 - 5.6 % 6.7  6.8  6.9  6.7  6.3   PH, Arterial 7.350 - 7.450 - - - 7.362  -  PCO2 arterial 32.0 - 48.0 mmHg - - - 58.9  -  Bicarbonate 20.0 - 28.0 mmol/L - - - 30.0  -  O2 Saturation % - - - 96.2  -    Capillary Blood Glucose: Lab Results  Component Value Date   GLUCAP 185 (H) 04/18/2023   GLUCAP 218 (H) 04/17/2023   GLUCAP 133 (H) 04/17/2023   GLUCAP 117 (H) 04/17/2023   GLUCAP 83 04/17/2023     Pulmonary Assessment Scores:  Pulmonary Assessment Scores     Row Name 11/16/23 1456         ADL UCSD   ADL Phase Entry     SOB Score total 94     Rest 2     Walk 3     Stairs 5     Bath 4     Dress 4     Shop 4       CAT Score   CAT Score 18       mMRC Score   mMRC Score 4       UCSD: Self-administered rating of dyspnea associated with activities of daily living (ADLs) 6-point scale (0 = not at all to 5 = maximal or unable to do because of breathlessness)  Scoring Scores range from 0 to 120.  Minimally important difference is 5 units  CAT: CAT can identify the health impairment of COPD patients and is better correlated with disease progression.  CAT has a scoring range of zero to 40. The CAT score is classified into four groups of low (less than 10), medium (10 - 20), high (21-30) and very high (31-40) based on the impact level of disease on health status. A  CAT score over 10 suggests significant symptoms.  A worsening CAT score could be explained by an exacerbation, poor medication adherence, poor inhaler technique, or progression of COPD or comorbid conditions.  CAT MCID is 2 points  mMRC: mMRC (Modified Medical Research Council) Dyspnea Scale is used to assess the degree of baseline functional disability in patients of respiratory disease due to dyspnea. No minimal important difference is established. A decrease in score of 1 point or greater is considered a positive change.   Pulmonary Function Assessment:  Pulmonary Function Assessment - 11/16/23 1455       Pulmonary Function Tests   FVC% 70 %   08/06/15   FEV1% 40 %    FEV1/FVC Ratio 45    RV% 124 %    DLCO% 55 %      Breath   Bilateral Breath Sounds Decreased;Wheezes;Inspiratory;Expiratory    Shortness of Breath Yes;Fear of Shortness of Breath;Limiting activity;Panic with Shortness of Breath          Exercise Target Goals: Exercise Program Goal: Individual exercise prescription set using results from initial 6 min walk test and THRR while considering  patient's activity barriers and safety.   Exercise Prescription Goal: Initial exercise prescription builds to 30-45 minutes a day of aerobic activity, 2-3 days per week.  Home exercise guidelines will be given to patient during program as part of exercise prescription that the participant will acknowledge.  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 HR 102 bpm     Max Ex. BP 136/64     2 Minute Post BP 128/62       Interval HR   1 Minute HR 101     2 Minute HR 92     3 Minute HR 102     4 Minute HR 99     5 Minute HR 98     6 Minute HR 102     2 Minute Post HR 77     Interval Heart Rate? Yes       Interval Oxygen    Interval Oxygen ? Yes     Baseline Oxygen  Saturation % 97 %     1 Minute Oxygen  Saturation % 93 %     1 Minute Liters of Oxygen  0 L  Room Air     2 Minute Oxygen  Saturation % 93 %     2 Minute Liters of Oxygen  0 L  3 Minute Oxygen  Saturation % 91 %     3 Minute Liters of Oxygen  0 L     4 Minute Oxygen  Saturation % 89 %     4 Minute Liters of Oxygen  0 L     5 Minute Oxygen  Saturation % 89 %     5 Minute Liters of Oxygen  0 L     6 Minute Oxygen  Saturation % 87 %     6 Minute Liters of Oxygen  0 L     2 Minute Post Oxygen  Saturation % 96 %     2 Minute Post Liters of Oxygen  0 L        Oxygen  Initial Assessment:  Oxygen  Initial Assessment - 11/16/23 1455       Home Oxygen    Home Oxygen  Device None    Sleep Oxygen  Prescription None    Home Exercise Oxygen  Prescription None    Home Resting Oxygen  Prescription None      Initial 6 min Walk   Oxygen  Used None      Program Oxygen  Prescription   Program Oxygen  Prescription None      Intervention   Short Term Goals To learn and demonstrate proper use of respiratory medications;To learn and understand importance of maintaining oxygen  saturations>88%;To learn and demonstrate proper pursed lip breathing techniques or other breathing techniques. ;To  learn and understand importance of monitoring SPO2 with pulse oximeter and demonstrate accurate use of the pulse oximeter.    Long  Term Goals Maintenance of O2 saturations>88%;Compliance with respiratory medication;Exhibits proper breathing techniques, such as pursed lip breathing or other method taught during program session;Verbalizes importance of monitoring SPO2 with pulse oximeter and return demonstration;Demonstrates proper use of MDI's          Oxygen  Re-Evaluation:  Oxygen  Re-Evaluation     Row Name 11/18/23 1256             Goals/Expected Outcomes   Short Term Goals To learn and demonstrate proper use of respiratory medications;To learn and understand importance of maintaining oxygen  saturations>88%;To learn and demonstrate proper pursed lip breathing techniques or other breathing techniques. ;To learn and understand importance of monitoring SPO2 with pulse oximeter and demonstrate accurate use of the pulse oximeter.       Long  Term Goals Maintenance of O2 saturations>88%;Compliance with respiratory medication;Exhibits proper breathing techniques, such as pursed lip breathing or other method taught during program session;Verbalizes importance of monitoring SPO2 with pulse oximeter and return demonstration;Demonstrates proper use of MDI's       Comments Reviewed PLB technique with pt.  Talked about how it works and it's importance in maintaining their exercise saturations.       Goals/Expected Outcomes Short: Become more profiecient at using PLB.   Long: Become independent at using PLB.          Oxygen  Discharge (Final Oxygen  Re-Evaluation):  Oxygen  Re-Evaluation - 11/18/23 1256       Goals/Expected Outcomes   Short Term Goals To learn and demonstrate proper use of respiratory medications;To learn and understand importance of maintaining oxygen  saturations>88%;To learn and demonstrate proper pursed lip breathing techniques or other breathing techniques. ;To learn and understand  importance of monitoring SPO2 with pulse oximeter and demonstrate accurate use of the pulse oximeter.    Long  Term Goals Maintenance of O2 saturations>88%;Compliance with respiratory medication;Exhibits proper breathing techniques, such as pursed lip breathing or other method taught during program session;Verbalizes importance of monitoring SPO2 with pulse oximeter and return demonstration;Demonstrates proper use  of MDI's    Comments Reviewed PLB technique with pt.  Talked about how it works and it's importance in maintaining their exercise saturations.    Goals/Expected Outcomes Short: Become more profiecient at using PLB.   Long: Become independent at using PLB.          Initial Exercise Prescription:  Initial Exercise Prescription - 11/16/23 1400       Date of Initial Exercise RX and Referring Provider   Date 11/16/23    Referring Provider Candyce Caraway MD   [facility]     Oxygen    Maintain Oxygen  Saturation 88% or higher      Treadmill   MPH 1.2    Grade 0.5    Minutes 15    METs 2      NuStep   Level 1    SPM 80    Minutes 15    METs 2      Prescription Details   Frequency (times per week) 3    Duration Progress to 30 minutes of continuous aerobic without signs/symptoms of physical distress      Intensity   THRR 40-80% of Max Heartrate 97-128    Ratings of Perceived Exertion 11-13    Perceived Dyspnea 0-4      Progression   Progression Continue to progress workloads to maintain intensity without signs/symptoms of physical distress.      Resistance Training   Training Prescription Yes    Weight 4 lb    Reps 10-15          Perform Capillary Blood Glucose checks as needed.  Exercise Prescription Changes:   Exercise Prescription Changes     Row Name 11/16/23 1400             Response to Exercise   Blood Pressure (Admit) 126/74       Blood Pressure (Exercise) 136/64       Blood Pressure (Exit) 112/70       Heart Rate (Admit) 65 bpm       Heart Rate  (Exercise) 102 bpm       Heart Rate (Exit) 71 bpm       Oxygen  Saturation (Admit) 97 %       Oxygen  Saturation (Exercise) 87 %       Oxygen  Saturation (Exit) 96 %       Rating of Perceived Exertion (Exercise) 14       Perceived Dyspnea (Exercise) 2       Symptoms SOB, leg pain 4/10 (calf)       Comments walk test results          Exercise Comments:   Exercise Comments     Row Name 11/16/23 1437 11/18/23 1255         Exercise Comments 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  Patient was oriented to gym and equipment including functions, settings, policies, and procedures.  Patient's individual exercise prescription and treatment plan were reviewed.  All starting workloads were established based on the results of the 6 minute walk test done at initial orientation visit.  The plan for exercise progression was also introduced and progression will be customized based on patient's performance and goals.         Exercise Goals and Review:   Exercise Goals     Row Name 11/16/23 1300             Exercise Goals   Increase Physical Activity Yes  Intervention Provide advice, education, support and counseling about physical activity/exercise needs.;Develop an individualized exercise prescription for aerobic and resistive training based on initial evaluation findings, risk stratification, comorbidities and participant's personal goals.       Expected Outcomes Short Term: Attend rehab on a regular basis to increase amount of physical activity.;Long Term: Exercising regularly at least 3-5 days a week.;Long Term: Add in home exercise to make exercise part of routine and to increase amount of physical activity.       Increase Strength and Stamina Yes       Intervention Provide advice, education, support and counseling about physical activity/exercise needs.;Develop an individualized exercise prescription for aerobic and resistive training based on initial  evaluation findings, risk stratification, comorbidities and participant's personal goals.       Expected Outcomes Short Term: Increase workloads from initial exercise prescription for resistance, speed, and METs.;Short Term: Perform resistance training exercises routinely during rehab and add in resistance training at home;Long Term: Improve cardiorespiratory fitness, muscular endurance and strength as measured by increased METs and functional capacity ( )       Able to understand and use rate of perceived exertion (RPE) scale Yes       Intervention Provide education and explanation on how to use RPE scale       Expected Outcomes Short Term: Able to use RPE daily in rehab to express subjective intensity level;Long Term:  Able to use RPE to guide intensity level when exercising independently       Able to understand and use Dyspnea scale Yes       Intervention Provide education and explanation on how to use Dyspnea scale       Expected Outcomes Short Term: Able to use Dyspnea scale daily in rehab to express subjective sense of shortness of breath during exertion;Long Term: Able to use Dyspnea scale to guide intensity level when exercising independently       Knowledge and understanding of Target Heart Rate Range (THRR) Yes       Intervention Provide education and explanation of THRR including how the numbers were predicted and where they are located for reference       Expected Outcomes Long Term: Able to use THRR to govern intensity when exercising independently;Short Term: Able to use daily as guideline for intensity in rehab;Short Term: Able to state/look up THRR       Able to check pulse independently Yes       Intervention Provide education and demonstration on how to check pulse in carotid and radial arteries.;Review the importance of being able to check your own pulse for safety during independent exercise       Expected Outcomes Short Term: Able to explain why pulse checking is important  during independent exercise;Long Term: Able to check pulse independently and accurately       Understanding of Exercise Prescription Yes       Intervention Provide education, explanation, and written materials on patient's individual exercise prescription       Expected Outcomes Short Term: Able to explain program exercise prescription;Long Term: Able to explain home exercise prescription to exercise independently          Exercise Goals Re-Evaluation :  Exercise Goals Re-Evaluation     Row Name 11/18/23 1257             Exercise Goal Re-Evaluation   Comments Reviewed RPE and dyspnea scale, THR and program prescription with pt today.  Pt voiced understanding and was given  a copy of goals to take home.       Expected Outcomes Short: Use RPE daily to regulate intensity.  Long: Follow program prescription in THR.          Discharge Exercise Prescription (Final Exercise Prescription Changes):  Exercise Prescription Changes - 11/16/23 1400       Response to Exercise   Blood Pressure (Admit) 126/74    Blood Pressure (Exercise) 136/64    Blood Pressure (Exit) 112/70    Heart Rate (Admit) 65 bpm    Heart Rate (Exercise) 102 bpm    Heart Rate (Exit) 71 bpm    Oxygen  Saturation (Admit) 97 %    Oxygen  Saturation (Exercise) 87 %    Oxygen  Saturation (Exit) 96 %    Rating of Perceived Exertion (Exercise) 14    Perceived Dyspnea (Exercise) 2    Symptoms SOB, leg pain 4/10 (calf)    Comments walk test results          Nutrition:  Target Goals: Understanding of nutrition guidelines, daily intake of sodium 1500mg , cholesterol 200mg , calories 30% from fat and 7% or less from saturated fats, daily to have 5 or more servings of fruits and vegetables.  Biometrics:  Pre Biometrics - 11/16/23 1438       Pre Biometrics   Height 5' 2 (1.575 m)    Weight 152 lb 8 oz (69.2 kg)    Waist Circumference 35 inches    Hip Circumference 37.5 inches    Waist to Hip Ratio 0.93 %    BMI  (Calculated) 27.89    Grip Strength 16.4 kg    Flexibility 0 in    Single Leg Stand 7.3 seconds           Nutrition Therapy Plan and Nutrition Goals:  Nutrition Therapy & Goals - 11/16/23 1453       Intervention Plan   Intervention Prescribe, educate and counsel regarding individualized specific dietary modifications aiming towards targeted core components such as weight, hypertension, lipid management, diabetes, heart failure and other comorbidities.;Nutrition handout(s) given to patient.    Expected Outcomes Short Term Goal: Understand basic principles of dietary content, such as calories, fat, sodium, cholesterol and nutrients.;Long Term Goal: Adherence to prescribed nutrition plan.          Nutrition Assessments:  MEDIFICTS Score Key: >=70 Need to make dietary changes  40-70 Heart Healthy Diet <= 40 Therapeutic Level Cholesterol Diet  Flowsheet Row PULMONARY REHAB COPD ORIENTATION from 11/16/2023 in [facility]ITATION  Picture Your Plate Total Score on Admission 46   Picture Your Plate Scores: <59 Unhealthy dietary pattern with much room for improvement. 41-50 Dietary pattern unlikely to meet recommendations for good health and room for improvement. 51-60 More healthful dietary pattern, with some room for improvement.  >60 Healthy dietary pattern, although there may be some specific behaviors that could be improved.    Nutrition Goals Re-Evaluation:   Nutrition Goals Discharge (Final Nutrition Goals Re-Evaluation):   Psychosocial: Target Goals: Acknowledge presence or absence of significant depression and/or stress, maximize coping skills, provide positive support system. Participant is able to verbalize types and ability to use techniques and skills needed for reducing stress and depression.  Initial Review & Psychosocial Screening:  Initial Psych Review & Screening - 11/16/23 1303       Initial Review   Current issues with History of  Depression;Current Depression;Current Stress Concerns;Current Psychotropic Meds;Current Sleep Concerns    Source of Stress Concerns Family;Chronic Illness;Unable to participate  in former interests or hobbies;Unable to perform yard/household activities    Comments granddaughter 56 that likes to push his buttons, worries about his wife's health as it declining (stage III kidney disease and SOB), trouble sleeping [facility] on buproion helps some      Family Dynamics   Good Support System? Yes   wife, daughter   Comments wife drives him back and forth      Barriers   Psychosocial barriers to participate in program Psychosocial barriers identified (see note);The patient should benefit from training in stress management and relaxation.      Screening Interventions   Interventions Encouraged to exercise;To provide support and resources with identified psychosocial needs;Provide feedback about the scores to participant    Expected Outcomes Short Term goal: Utilizing psychosocial counselor, staff and physician to assist with identification of specific Stressors or current issues interfering with healing process. Setting desired goal for each stressor or current issue identified.;Long Term Goal: Stressors or current issues are controlled or eliminated.;Short Term goal: Identification and review with participant of any Quality of Life or Depression concerns found by scoring the questionnaire.;Long Term goal: The participant improves quality of Life and PHQ9 Scores as seen by post scores and/or verbalization of changes          Quality of Life Scores:  Scores of 19 and below usually indicate a poorer quality of life in these areas.  A difference of  2-3 points is a clinically meaningful difference.  A difference of 2-3 points in the total score of the Quality of Life Index has been associated with significant improvement in overall quality of life, self-image, physical symptoms, and general  health in studies assessing change in quality of life.   PHQ-9: Review Flowsheet       11/16/2023 12/07/2012 11/21/2012  Depression screen PHQ 2/9  Decreased Interest 3 0 0  Down, Depressed, Hopeless 2 0 1  PHQ - 2 Score 5 0 1  Altered sleeping 3 - -  Tired, decreased energy 2 - -  Change in appetite 3 - -  Feeling bad or failure about yourself  2 - -  Trouble concentrating 3 - -  Moving slowly or fidgety/restless 3 - -  Suicidal thoughts 0 - -  PHQ-9 Score 21 - -  Difficult doing work/chores Very difficult - -   Interpretation of Total Score  Total Score Depression Severity:  1-4 = Minimal depression, 5-9 = Mild depression, 10-14 = Moderate depression, 15-19 = Moderately severe depression, 20-27 = Severe depression   Psychosocial Evaluation and Intervention:  Psychosocial Evaluation - 11/16/23 1438       Psychosocial Evaluation & Interventions   Interventions Stress management education;Relaxation education;Encouraged to exercise with the program and follow exercise prescription    Comments Wong is coming into pulmonary rehab for COPD through the [facility].  He did the program previously in 2014.  He is looking forward to attending.  He really wants to work on his breathing and his mobility.  He is also hoping to be able to work on getting back to singing again as he misses singing in the church choir.  He lives at home with his wife who also has significant health issues.  He worries about her health as she has stage III kidney disease that went undiagnosised for 3 years.  He also has a granddaughter that stays with them on occassion and likes to push her limits.  She is 13 and he says shes going  on 35!  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 He does see a councelor monthly through the [facility] and is on bipropion for this. He says it helps some but not completely.  His wife usually drives him to his appointments but also goes with their daughter and her son to  appointments.  So appointment schedules could cause some attendance conflicts, but other wise he does not see any issues getting to rehab as long as calendar is free.  He has two dogs, a rotwiller and boxer, that he enjoys walking (or being dragged by) and spending time with them.  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    Expected Outcomes short: Attend rehab to build stamina LOng: Work on improving his breathing    Continue Psychosocial Services  Follow up required by staff          Psychosocial Re-Evaluation:   Psychosocial Discharge (Final Psychosocial Re-Evaluation):    Education: Education Goals: Education classes will be provided on a weekly basis, covering required topics. Participant will state understanding/return demonstration of topics presented.  Learning Barriers/Preferences:  Learning Barriers/Preferences - 11/16/23 1454       Learning Barriers/Preferences   Learning Barriers None    Learning Preferences None          Education Topics: How Lungs Work and Diseases: - Discuss the anatomy of the lungs and diseases that can affect the lungs, such as COPD.   Exercise: -Discuss the importance of exercise, FITT principles of exercise, normal and abnormal responses to exercise, and how to exercise safely.   Environmental Irritants: -Discuss types of environmental irritants and how to limit exposure to environmental irritants.   Meds/Inhalers and oxygen : - Discuss respiratory medications, definition of an inhaler and oxygen , and the proper way to use an inhaler and oxygen .   Energy Saving Techniques: - Discuss methods to conserve energy and decrease shortness of breath when performing activities of daily living.    Bronchial Hygiene / Breathing Techniques: - Discuss breathing mechanics, pursed-lip breathing technique,  proper posture, effective ways to clear airways, and other functional breathing techniques   Cleaning  Equipment: - Provides group verbal and written instruction about the health risks of elevated stress, cause of high stress, and healthy ways to reduce stress.   Nutrition I: Fats: - Discuss the types of cholesterol, what cholesterol does to the body, and how cholesterol levels can be controlled.   Nutrition II: Labels: -Discuss the different components of food labels and how to read food labels.   Respiratory Infections: - Discuss the signs and symptoms of respiratory infections, ways to prevent respiratory infections, and the importance of seeking medical treatment when having a respiratory infection.   Stress I: Signs and Symptoms: - Discuss the causes of stress, how stress may lead to anxiety and depression, and ways to limit stress.   Stress II: Relaxation: -Discuss relaxation techniques to limit stress.   Oxygen  for Home/Travel: - Discuss how to prepare for travel when on oxygen  and proper ways to transport and store oxygen  to ensure safety.   Knowledge Questionnaire Score:  Knowledge Questionnaire Score - 11/16/23 1454       Knowledge Questionnaire Score   Pre Score 15/18          Core Components/Risk Factors/Patient Goals at Admission:  Personal Goals and Risk Factors at Admission - 11/16/23 1454       Core Components/Risk Factors/Patient  Goals on Admission    Weight Management Yes;Weight Maintenance    Intervention Weight Management: Develop a combined nutrition and exercise program designed to reach desired caloric intake, while maintaining appropriate intake of nutrient and fiber, sodium and fats, and appropriate energy expenditure required for the weight goal.;Weight Management: Provide education and appropriate resources to help participant work on and attain dietary goals.    Admit Weight 152 lb 8 oz (69.2 kg)    Goal Weight: Short Term 150 lb (68 kg)    Goal Weight: Long Term 150 lb (68 kg)    Expected Outcomes Short Term: Continue to assess and modify  interventions until short term weight is achieved;Long Term: Adherence to nutrition and physical activity/exercise program aimed toward attainment of established weight goal;Weight Maintenance: Understanding of the daily nutrition guidelines, which includes 25-35% calories from fat, 7% or less cal from saturated fats, less than 200mg  cholesterol, less than 1.5gm of sodium, & 5 or more servings of fruits and vegetables daily    Improve shortness of breath with ADL's Yes    Intervention Provide education, individualized exercise plan and daily activity instruction to help decrease symptoms of SOB with activities of daily living.    Expected Outcomes Short Term: Improve cardiorespiratory fitness to achieve a reduction of symptoms when performing ADLs;Long Term: Be able to perform more ADLs without symptoms or delay the onset of symptoms    Increase knowledge of respiratory medications and ability to use respiratory devices properly  Yes    Intervention Provide education and demonstration as needed of appropriate use of medications, inhalers, and oxygen  therapy.    Expected Outcomes Short Term: Achieves understanding of medications use. Understands that oxygen  is a medication prescribed by physician. Demonstrates appropriate use of inhaler and oxygen  therapy.;Long Term: Maintain appropriate use of medications, inhalers, and oxygen  therapy.    Diabetes --   Hx of DM but no meds currently   Hypertension Yes    Intervention Provide education on lifestyle modifcations including regular physical activity/exercise, weight management, moderate sodium restriction and increased consumption of fresh fruit, vegetables, and low fat dairy, alcohol moderation, and smoking cessation.;Monitor prescription use compliance.    Expected Outcomes Long Term: Maintenance of blood pressure at goal levels.;Short Term: Continued assessment and intervention until BP is < 140/37mm HG in hypertensive participants. < 130/7mm HG in  hypertensive participants with diabetes, heart failure or chronic kidney disease.    Lipids Yes    Intervention Provide education and support for participant on nutrition & aerobic/resistive exercise along with prescribed medications to achieve LDL 70mg , HDL >40mg .    Expected Outcomes Short Term: Participant states understanding of desired cholesterol values and is compliant with medications prescribed. Participant is following exercise prescription and nutrition guidelines.;Long Term: Cholesterol controlled with medications as prescribed, with individualized exercise RX and with personalized nutrition plan. Value goals: LDL < 70mg , HDL > 40 mg.          Core Components/Risk Factors/Patient Goals Review:    Core Components/Risk Factors/Patient Goals at Discharge (Final Review):    ITP Comments:  ITP Comments     Row Name 11/16/23 1433 11/18/23 1255 11/30/23 1511       ITP Comments Patient attend orientation today.  Patient is attending Pulmonary Rehabilitation Program.  Documentation for diagnosis can be found in scanned into media from [facility].  Reviewed medical chart, RPE/RPD, gym safety, and program guidelines.  Patient was fitted to equipment they will be using during rehab.  Patient is scheduled to start  exercise on Friday 11/18/23 at 1pm.   Initial ITP created and sent for review and signature by Dr. Anton Kelp, Medical Director for Pulmonary Rehabilitation Program. First full day of exercise!  Patient was oriented to gym and equipment including functions, settings, policies, and procedures.  Patient's individual exercise prescription and treatment plan were reviewed.  All starting workloads were established based on the results of the 6 minute walk test done at initial orientation visit.  The plan for exercise progression was also introduced and progression will be customized based on patient's performance and goals. 30 day review completed. ITP sent to Dr.Jehanzeb Memon, Medical Director  of  Pulmonary Rehab. Continue with ITP unless changes are made by physician.  Newer to prorgam        Comments: 30 day review

## 2023-12-02 ENCOUNTER — Encounter (HOSPITAL_COMMUNITY)

## 2023-12-05 ENCOUNTER — Encounter (HOSPITAL_COMMUNITY)
Admission: RE | Admit: 2023-12-05 | Discharge: 2023-12-05 | Disposition: A | Discharge status: Home / Self Care | Source: Ambulatory Visit

## 2023-12-05 DIAGNOSIS — J449 Chronic obstructive pulmonary disease, unspecified: Secondary | ICD-10-CM | POA: Diagnosis not present

## 2023-12-07 ENCOUNTER — Encounter (HOSPITAL_COMMUNITY): Admission: RE | Admit: 2023-12-07 | Source: Ambulatory Visit

## 2023-12-08 ENCOUNTER — Encounter (HOSPITAL_COMMUNITY)
Admission: RE | Admit: 2023-12-08 | Discharge: 2023-12-08 | Disposition: A | Discharge status: Home / Self Care | Source: Ambulatory Visit

## 2023-12-08 DIAGNOSIS — J449 Chronic obstructive pulmonary disease, unspecified: Secondary | ICD-10-CM

## 2023-12-08 NOTE — Progress Notes (Signed)
 Daily Session Note  Patient Details  Name: Mark Schroeder MRN: [id number] Date of Birth: 11-Apr-1945 Referring Provider:   Flowsheet Row PULMONARY REHAB COPD ORIENTATION from 11/16/2023 in [facility] CARDIAC REHABILITATION  Referring Provider Candyce Caraway MD  [[facility]]    Encounter Date: 12/08/2023  Check In:  Session Check In - 12/08/23 1329       Check-In   Supervising physician immediately available to respond to emergencies See telemetry face sheet for immediately available MD    Location [facility]    Staff Present Powell Benders, BS, Exercise Physiologist;Hillary Dean BSN, RN    Virtual Visit No    Medication changes reported     No    Fall or balance concerns reported    No    Warm-up and Cool-down Performed on first and last piece of equipment    Resistance Training Performed Yes    VAD Patient? No    PAD/SET Patient? No      Pain Assessment   Currently in Pain? No/denies          Capillary Blood Glucose: No results found for this or any previous visit (from the past 24 hours).    Social History   Tobacco Use  Smoking Status Former   Current packs/day: 0.25   Average packs/day: 0.3 packs/day for 35.0 years (8.8 ttl pk-yrs)   Types: Cigarettes  Smokeless Tobacco Never    Goals Met:  Proper associated with RPD/PD & O2 Sat Independence with exercise equipment Using PLB without cueing & demonstrates good technique Exercise tolerated well No report of concerns or symptoms today Strength training completed today  Goals Unmet:  Not Applicable  Comments: Pt able to follow exercise prescription today without complaint.  Will continue to monitor for progression.

## 2023-12-09 ENCOUNTER — Encounter (HOSPITAL_COMMUNITY)
Admission: RE | Admit: 2023-12-09 | Discharge: 2023-12-09 | Disposition: A | Discharge status: Home / Self Care | Source: Ambulatory Visit

## 2023-12-09 DIAGNOSIS — J449 Chronic obstructive pulmonary disease, unspecified: Secondary | ICD-10-CM | POA: Diagnosis not present

## 2023-12-09 NOTE — Progress Notes (Signed)
 Daily Session Note  Patient Details  Name: ERMINE SPOFFORD MRN: [id number] Date of Birth: 29-Nov-1945 Referring Provider:   Flowsheet Row PULMONARY REHAB COPD ORIENTATION from 11/16/2023 in [facility] CARDIAC REHABILITATION  Referring Provider Candyce Caraway MD  [[facility]]    Encounter Date: 12/09/2023  Check In:  Session Check In - 12/09/23 1316       Check-In   Supervising physician immediately available to respond to emergencies See telemetry face sheet for immediately available MD    [facility]    Staff Present Powell Benders, BS, Exercise Physiologist;Brittany Jackquline, BSN, RN, WTA-C;Victoria Zina, RN    Virtual Visit No    Medication changes reported     No    Fall or balance concerns reported    No    Tobacco Cessation No Change    Warm-up and Cool-down Performed on first and last piece of equipment    Resistance Training Performed Yes    VAD Patient? No    PAD/SET Patient? No      Pain Assessment   Currently in Pain? No/denies    Multiple Pain Sites No          Capillary Blood Glucose: No results found for this or any previous visit (from the past 24 hours).    Social History   Tobacco Use  Smoking Status Former   Current packs/day: 0.25   Average packs/day: 0.3 packs/day for 35.0 years (8.8 ttl pk-yrs)   Types: Cigarettes  Smokeless Tobacco Never    Goals Met:  Independence with exercise equipment Exercise tolerated well No report of concerns or symptoms today Strength training completed today  Goals Unmet:  Not Applicable  Comments: Pt able to follow exercise prescription today without complaint.  Will continue to monitor for progression.

## 2023-12-12 ENCOUNTER — Encounter (HOSPITAL_COMMUNITY)
Admission: RE | Admit: 2023-12-12 | Discharge: 2023-12-12 | Disposition: A | Discharge status: Home / Self Care | Source: Ambulatory Visit

## 2023-12-12 DIAGNOSIS — J449 Chronic obstructive pulmonary disease, unspecified: Secondary | ICD-10-CM | POA: Diagnosis not present

## 2023-12-12 NOTE — Progress Notes (Signed)
 Daily Session Note  Patient Details  Name: KEMAURI MUSA MRN: [id number] Date of Birth: [birth year] Referring Provider:   Flowsheet Row PULMONARY REHAB COPD ORIENTATION from 11/16/2023 in [facility] CARDIAC REHABILITATION  Referring Provider Candyce Caraway MD  [[facility]]    Encounter Date: 12/12/2023  Check In:  Session Check In - 12/12/23 1426       Check-In   Supervising physician immediately available to respond to emergencies See telemetry face sheet for immediately available MD    [facility]    Staff Present Powell Benders, BS, Exercise Physiologist    Virtual Visit No    Medication changes reported     No    Fall or balance concerns reported    No    Tobacco Cessation No Change    Warm-up and Cool-down Performed on first and last piece of equipment    Resistance Training Performed Yes    VAD Patient? No    PAD/SET Patient? No      Pain Assessment   Currently in Pain? No/denies    Multiple Pain Sites No          Capillary Blood Glucose: No results found for this or any previous visit (from the past 24 hours).    Social History   Tobacco Use  Smoking Status Former   Current packs/day: 0.25   Average packs/day: 0.3 packs/day for 35.0 years (8.8 ttl pk-yrs)   Types: Cigarettes  Smokeless Tobacco Never    Goals Met:  Independence with exercise equipment Using PLB without cueing & demonstrates good technique Exercise tolerated well No report of concerns or symptoms today Strength training completed today  Goals Unmet:  Not Applicable  Comments: Pt able to follow exercise prescription today without complaint.  Will continue to monitor for progression.

## 2023-12-14 ENCOUNTER — Encounter (HOSPITAL_COMMUNITY)

## 2023-12-14 ENCOUNTER — Encounter (HOSPITAL_COMMUNITY)
Admission: RE | Admit: 2023-12-14 | Discharge: 2023-12-14 | Disposition: A | Discharge status: Home / Self Care | Source: Ambulatory Visit

## 2023-12-14 DIAGNOSIS — J449 Chronic obstructive pulmonary disease, unspecified: Secondary | ICD-10-CM

## 2023-12-14 NOTE — Progress Notes (Signed)
 Daily Session Note  Patient Details  Name: JERMEL ARTLEY MRN: [id number] Date of Birth: [birth year] Referring Provider:   Flowsheet Row PULMONARY REHAB COPD ORIENTATION from 11/16/2023 in [facility] CARDIAC REHABILITATION  Referring Provider Candyce Caraway MD  [[facility]]    Encounter Date: 12/14/2023  Check In:  Session Check In - 12/14/23 1451       Check-In   Supervising physician immediately available to respond to emergencies See telemetry face sheet for immediately available MD    [facility]    Staff Present Laymon Rattler, BSN, RN, Oddis Louder, RN, BSN    Virtual Visit No    Medication changes reported     No    Fall or balance concerns reported    No    Tobacco Cessation No Change    Warm-up and Cool-down Performed on first and last piece of equipment    Resistance Training Performed Yes    VAD Patient? No    PAD/SET Patient? No      Pain Assessment   Currently in Pain? No/denies          Capillary Blood Glucose: No results found for this or any previous visit (from the past 24 hours).    Social History   Tobacco Use  Smoking Status Former   Current packs/day: 0.25   Average packs/day: 0.3 packs/day for 35.0 years (8.8 ttl pk-yrs)   Types: Cigarettes  Smokeless Tobacco Never    Goals Met:  Proper associated with RPD/PD & O2 Sat Independence with exercise equipment Improved SOB with ADL's Using PLB without cueing & demonstrates good technique Exercise tolerated well No report of concerns or symptoms today Strength training completed today  Goals Unmet:  Not Applicable  Comments: Pt able to follow exercise prescription today without complaint.  Will continue to monitor for progression.

## 2023-12-15 ENCOUNTER — Encounter (HOSPITAL_COMMUNITY): Admission: RE | Admit: 2023-12-15 | Discharge: 2023-12-15 | Disposition: A | Source: Ambulatory Visit

## 2023-12-15 DIAGNOSIS — J449 Chronic obstructive pulmonary disease, unspecified: Secondary | ICD-10-CM | POA: Diagnosis not present

## 2023-12-15 NOTE — Progress Notes (Signed)
 Daily Session Note  Patient Details  Name: Mark Schroeder MRN: [id number] Date of Birth: August 05, 1945 Referring Provider:   Flowsheet Row PULMONARY REHAB COPD ORIENTATION from 11/16/2023 in [facility] REHABILITATION  Referring Provider Candyce Caraway MD  [[facility]]    Encounter Date: 12/15/2023  Check In:  Session Check In - 12/15/23 1343       Check-In   Supervising physician immediately available to respond to emergencies See telemetry face sheet for immediately available MD    [facility]    Staff Present Powell Benders, BS, Exercise Physiologist;Bessie Livingood Vonzell, MA, RCEP, CCRP, Sueellen Louder, RN, BSN    Virtual Visit No    Medication changes reported     No    Fall or balance concerns reported    No    Warm-up and Cool-down Performed on first and last piece of equipment    Resistance Training Performed Yes    VAD Patient? No    PAD/SET Patient? No      Pain Assessment   Currently in Pain? No/denies          Capillary Blood Glucose: No results found for this or any previous visit (from the past 24 hours).    Social History   Tobacco Use  Smoking Status Former   Current packs/day: 0.25   Average packs/day: 0.3 packs/day for 35.0 years (8.8 ttl pk-yrs)   Types: Cigarettes  Smokeless Tobacco Never    Goals Met:  Proper associated with RPD/PD & O2 Sat Independence with exercise equipment Using PLB without cueing & demonstrates good technique Exercise tolerated well No report of concerns or symptoms today Strength training completed today  Goals Unmet:  Not Applicable  Comments: Pt able to follow exercise prescription today without complaint.  Will continue to monitor for progression.

## 2023-12-16 ENCOUNTER — Encounter (HOSPITAL_COMMUNITY)

## 2023-12-19 ENCOUNTER — Encounter (HOSPITAL_COMMUNITY)
Admission: RE | Admit: 2023-12-19 | Discharge: 2023-12-19 | Disposition: A | Discharge status: Home / Self Care | Source: Ambulatory Visit

## 2023-12-19 DIAGNOSIS — J449 Chronic obstructive pulmonary disease, unspecified: Secondary | ICD-10-CM | POA: Diagnosis not present

## 2023-12-19 NOTE — Progress Notes (Signed)
 Daily Session Note  Patient Details  Name: Mark Schroeder MRN: [id number] Date of Birth: Oct 06, 1945 Referring Provider:   Flowsheet Row PULMONARY REHAB COPD ORIENTATION from 11/16/2023 in [facility] CARDIAC REHABILITATION  Referring Provider Candyce Caraway MD  [[facility]]    Encounter Date: 12/19/2023  Check In:  Session Check In - 12/19/23 1418       Check-In   Supervising physician immediately available to respond to emergencies See telemetry face sheet for immediately available MD    [facility]    Staff Present Richerd Buddle, RN;Heather Con, [place], Exercise Physiologist;Lavonne Cass Jackquline, BSN, RN, WTA-C    Virtual Visit No    Medication changes reported     No    Fall or balance concerns reported    No    Tobacco Cessation No Change    Warm-up and Cool-down Performed on first and last piece of equipment    Resistance Training Performed Yes    VAD Patient? No    PAD/SET Patient? No      Pain Assessment   Currently in Pain? No/denies          Capillary Blood Glucose: No results found for this or any previous visit (from the past 24 hours).    Social History   Tobacco Use  Smoking Status Former   Current packs/day: 0.25   Average packs/day: 0.3 packs/day for 35.0 years (8.8 ttl pk-yrs)   Types: Cigarettes  Smokeless Tobacco Never    Goals Met:  Proper associated with RPD/PD & O2 Sat Independence with exercise equipment Improved SOB with ADL's Using PLB without cueing & demonstrates good technique Exercise tolerated well No report of concerns or symptoms today Strength training completed today  Goals Unmet:  Not Applicable  Comments: Pt able to follow exercise prescription today without complaint.  Will continue to monitor for progression.

## 2023-12-21 ENCOUNTER — Encounter (HOSPITAL_COMMUNITY)

## 2023-12-23 ENCOUNTER — Encounter (HOSPITAL_COMMUNITY)
Admission: RE | Admit: 2023-12-23 | Discharge: 2023-12-23 | Disposition: A | Discharge status: Home / Self Care | Source: Ambulatory Visit

## 2023-12-23 DIAGNOSIS — J449 Chronic obstructive pulmonary disease, unspecified: Secondary | ICD-10-CM

## 2023-12-23 NOTE — Progress Notes (Signed)
 Daily Session Note  Patient Details  Name: Mark Schroeder MRN: [id number] Date of Birth: 02-09-1946 Referring Provider:   Flowsheet Row PULMONARY REHAB COPD ORIENTATION from 11/16/2023 in [facility] CARDIAC REHABILITATION  Referring Provider Candyce Caraway MD  [[facility]]    Encounter Date: 12/23/2023  Check In:  Session Check In - 12/23/23 1254       Check-In   Supervising physician immediately available to respond to emergencies See telemetry face sheet for immediately available MD    [facility]    Staff Present Harlene Gelineau, MA, RCEP, CCRP, CCET;Althia Egolf Jackquline, BSN, RN, WTA-C    Virtual Visit No    Medication changes reported     No    Fall or balance concerns reported    No    Tobacco Cessation No Change    Warm-up and Cool-down Performed on first and last piece of equipment    Resistance Training Performed Yes    VAD Patient? No    PAD/SET Patient? No      Pain Assessment   Currently in Pain? No/denies          Capillary Blood Glucose: No results found for this or any previous visit (from the past 24 hours).    Social History   Tobacco Use  Smoking Status Former   Current packs/day: 0.25   Average packs/day: 0.3 packs/day for 35.0 years (8.8 ttl pk-yrs)   Types: Cigarettes  Smokeless Tobacco Never    Goals Met:  Proper associated with RPD/PD & O2 Sat Independence with exercise equipment Improved SOB with ADL's Using PLB without cueing & demonstrates good technique Exercise tolerated well No report of concerns or symptoms today Strength training completed today  Goals Unmet:  Not Applicable  Comments: Pt able to follow exercise prescription today without complaint.  Will continue to monitor for progression.

## 2023-12-26 ENCOUNTER — Encounter (HOSPITAL_COMMUNITY)

## 2023-12-28 ENCOUNTER — Encounter (HOSPITAL_COMMUNITY)

## 2023-12-28 ENCOUNTER — Encounter (HOSPITAL_COMMUNITY): Payer: Self-pay

## 2023-12-28 DIAGNOSIS — J449 Chronic obstructive pulmonary disease, unspecified: Secondary | ICD-10-CM

## 2023-12-28 NOTE — Progress Notes (Signed)
 Pulmonary Individual Treatment Plan  Patient Details  Name: Mark Schroeder MRN: [id number] Date of Birth: 1946-01-06 Referring Provider:   Flowsheet Row PULMONARY REHAB COPD ORIENTATION from 11/16/2023 in [facility] RE[facility]  Referring Provider Candyce Caraway MD  [[facility]]    Initial Encounter Date:  Flowsheet Row PULMONARY REHAB COPD ORIENTATION from 11/16/2023 in [facility]IAC RE[facility]  Date 11/16/23    Visit Diagnosis: Chronic obstructive pulmonary disease, unspecified COPD type (HCC)  Patient's Home Medications on Admission:   Current Outpatient Medications:    acetaminophen  (TYLENOL ) 325 MG tablet, Take 2 tablets (650 mg total) by mouth every 6 (six) hours as needed for mild pain (or Fever >/= 101)., Disp: 12 tablet, Rfl: 1   albuterol  (PROVENTIL ) (2.5 MG/3ML) 0.083% nebulizer solution, Take 3 mLs (2.5 mg total) by nebulization 3 (three) times daily as needed for wheezing or shortness of breath., Disp: 75 mL, Rfl: 12   amLODipine  (NORVASC ) 10 MG tablet, Take 5 mg by mouth daily., Disp: , Rfl:    aspirin  EC 81 MG tablet, Take 1 tablet (81 mg total) by mouth daily with breakfast., Disp: 30 tablet, Rfl: 2   atorvastatin  (LIPITOR) 40 MG tablet, Take 20 mg by mouth at bedtime., Disp: , Rfl:    azelastine (ASTELIN) 0.1 % nasal spray, Place 2 sprays into both nostrils 2 (two) times daily. Use in each nostril as directed, Disp: , Rfl:    azithromycin  (ZITHROMAX ) 250 MG tablet, Take 250 mg by mouth 3 (three) times a week. M,W,F, Disp: , Rfl:    bacitracin ointment, Apply 1 Application topically 2 (two) times daily as needed (skin abrasion)., Disp: , Rfl:    Budeson-Glycopyrrol-Formoterol  (BREZTRI AEROSPHERE) 160-9-4.8 MCG/ACT AERO, Inhale 2 puffs into the lungs in the morning and at bedtime., Disp: , Rfl:    budesonide -formoterol  (SYMBICORT ) 160-4.5 MCG/ACT inhaler, Inhale 2 puffs into the lungs 2 (two) times daily., Disp: 1 each, Rfl: 12   buPROPion  (ZYBAN ) 150 MG 12 hr  tablet, Take 150 mg by mouth daily., Disp: , Rfl:    carboxymethylcellulose (REFRESH PLUS) 0.5 % SOLN, Place 1 drop into both eyes 4 (four) times daily as needed (for dry eyes)., Disp: , Rfl:    carvedilol  (COREG ) 25 MG tablet, Take 1 tablet (25 mg total) by mouth 2 (two) times daily with a meal., Disp: 60 tablet, Rfl: 1   cetirizine (ZYRTEC) 10 MG tablet, Take 10 mg by mouth at bedtime., Disp: , Rfl:    Cholecalciferol  (VITAMIN D  PO), Take 1,000 Units by mouth daily., Disp: , Rfl:    divalproex  (DEPAKOTE  ER) 500 MG 24 hr tablet, Take 500 mg by mouth daily., Disp: , Rfl:    Emollient (EUCERIN DAILY HYDRATION) CREA, Apply 1 Application topically in the morning and at bedtime., Disp: , Rfl:    famotidine  (PEPCID ) 40 MG tablet, Take 40 mg by mouth 2 (two) times daily., Disp: , Rfl:    finasteride (PROSCAR) 5 MG tablet, Take 5 mg by mouth daily., Disp: , Rfl:    gabapentin  (NEURONTIN ) 300 MG capsule, Take 300 mg by mouth at bedtime., Disp: , Rfl:    guaiFENesin  (ROBITUSSIN) 100 MG/5ML liquid, Take 10 mLs by mouth every 4 (four) hours as needed for cough or to loosen phlegm., Disp: , Rfl:    ipratropium (ATROVENT ) 0.06 % nasal spray, Place 2 sprays into both nostrils in the morning and at bedtime., Disp: , Rfl:    lactobacillus acidophilus (BACID) TABS tablet, Take 1 tablet by mouth  daily., Disp: , Rfl:    loratadine  (CLARITIN ) 10 MG tablet, Take 1 tablet (10 mg total) by mouth daily., Disp: 30 tablet, Rfl: 0   Melatonin 5 MG TABS, Take 10 mg by mouth at bedtime., Disp: , Rfl:    pantoprazole  (PROTONIX ) 40 MG tablet, Take 40 mg by mouth daily., Disp: , Rfl:    predniSONE  (DELTASONE ) 50 MG tablet, Take 1 tablet (50 mg total) by mouth 2 (two) times daily with a meal., Disp: 14 tablet, Rfl: 0   tamsulosin  (FLOMAX ) 0.4 MG CAPS capsule, Take 0.8 mg by mouth daily after supper., Disp: , Rfl:   Past Medical History: Past Medical History:  Diagnosis Date   COPD (chronic obstructive pulmonary disease) (HCC)     Depression    Diabetic peripheral neuropathy (HCC)    Hyperlipidemia    Hypertension    PTSD (post-traumatic stress disorder)    Type 2 diabetes mellitus (HCC)     Tobacco Use: Social History   Tobacco Use  Smoking Status Former   Current packs/day: 0.25   Average packs/day: 0.3 packs/day for 35.0 years (8.8 ttl pk-yrs)   Types: Cigarettes  Smokeless Tobacco Never    Labs: Review Flowsheet  More data may exist      Latest Ref Rng & Units 08/15/2014 04/14/2019 08/15/2019 03/05/2020 04/17/2023  Labs for ITP Cardiac and Pulmonary Rehab  Trlycerides <150 mg/dL - - 877  812  -  Hemoglobin A1c 4.8 - 5.6 % 6.7  6.8  6.9  6.7  6.3   PH, Arterial 7.350 - 7.450 - - - 7.362  -  PCO2 arterial 32.0 - 48.0 mmHg - - - 58.9  -  Bicarbonate 20.0 - 28.0 mmol/L - - - 30.0  -  O2 Saturation % - - - 96.2  -    Capillary Blood Glucose: Lab Results  Component Value Date   GLUCAP 185 (H) 04/18/2023   GLUCAP 218 (H) 04/17/2023   GLUCAP 133 (H) 04/17/2023   GLUCAP 117 (H) 04/17/2023   GLUCAP 83 04/17/2023     Pulmonary Assessment Scores:  Pulmonary Assessment Scores     Row Name 11/16/23 1456         ADL UCSD   ADL Phase Entry     SOB Score total 94     Rest 2     Walk 3     Stairs 5     Bath 4     Dress 4     Shop 4       CAT Score   CAT Score 18       mMRC Score   mMRC Score 4       UCSD: Self-administered rating of dyspnea associated with activities of daily living (ADLs) 6-point scale (0 = not at all to 5 = maximal or unable to do because of breathlessness)  Scoring Scores range from 0 to 120.  Minimally important difference is 5 units  CAT: CAT can identify the health impairment of COPD patients and is better correlated with disease progression.  CAT has a scoring range of zero to 40. The CAT score is classified into four groups of low (less than 10), medium (10 - 20), high (21-30) and very high (31-40) based on the impact level of disease on health status. A  CAT score over 10 suggests significant symptoms.  A worsening CAT score could be explained by an exacerbation, poor medication adherence, poor inhaler technique, or progression of COPD or comorbid conditions.  CAT MCID is 2 points  mMRC: mMRC (Modified Medical Research Council) Dyspnea Scale is used to assess the degree of baseline functional disability in patients of respiratory disease due to dyspnea. No minimal important difference is established. A decrease in score of 1 point or greater is considered a positive change.   Pulmonary Function Assessment:  Pulmonary Function Assessment - 11/16/23 1455       Pulmonary Function Tests   FVC% 70 %   08/06/15   FEV1% 40 %    FEV1/FVC Ratio 45    RV% 124 %    DLCO% 55 %      Breath   Bilateral Breath Sounds Decreased;Wheezes;Inspiratory;Expiratory    Shortness of Breath Yes;Fear of Shortness of Breath;Limiting activity;Panic with Shortness of Breath          Exercise Target Goals: Exercise Program Goal: Individual exercise prescription set using results from initial 6 min walk test and THRR while considering  patient's activity barriers and safety.   Exercise Prescription Goal: Initial exercise prescription builds to 30-45 minutes a day of aerobic activity, 2-3 days per week.  Home exercise guidelines will be given to patient during program as part of exercise prescription that the participant will acknowledge.  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 HR 102 bpm     Max Ex. BP 136/64     2 Minute Post BP 128/62       Interval HR   1 Minute HR 101     2 Minute HR 92     3 Minute HR 102     4 Minute HR 99     5 Minute HR 98     6 Minute HR 102     2 Minute Post HR 77     Interval Heart Rate? Yes       Interval Oxygen    Interval Oxygen ? Yes     Baseline Oxygen  Saturation % 97 %     1 Minute Oxygen  Saturation % 93 %     1 Minute Liters of Oxygen  0 L  Room Air     2 Minute Oxygen  Saturation % 93 %     2 Minute Liters of Oxygen  0 L  3 Minute Oxygen  Saturation % 91 %     3 Minute Liters of Oxygen  0 L     4 Minute Oxygen  Saturation % 89 %     4 Minute Liters of Oxygen  0 L     5 Minute Oxygen  Saturation % 89 %     5 Minute Liters of Oxygen  0 L     6 Minute Oxygen  Saturation % 87 %     6 Minute Liters of Oxygen  0 L     2 Minute Post Oxygen  Saturation % 96 %     2 Minute Post Liters of Oxygen  0 L        Oxygen  Initial Assessment:  Oxygen  Initial Assessment - 11/16/23 1455       Home Oxygen    Home Oxygen  Device None    Sleep Oxygen  Prescription None    Home Exercise Oxygen  Prescription None    Home Resting Oxygen  Prescription None      Initial 6 min Walk   Oxygen  Used None      Program Oxygen  Prescription   Program Oxygen  Prescription None      Intervention   Short Term Goals To learn and demonstrate proper use of respiratory medications;To learn and understand importance of maintaining oxygen  saturations>88%;To learn and demonstrate proper pursed lip breathing techniques or other breathing techniques. ;To  learn and understand importance of monitoring SPO2 with pulse oximeter and demonstrate accurate use of the pulse oximeter.    Long  Term Goals Maintenance of O2 saturations>88%;Compliance with respiratory medication;Exhibits proper breathing techniques, such as pursed lip breathing or other method taught during program session;Verbalizes importance of monitoring SPO2 with pulse oximeter and return demonstration;Demonstrates proper use of MDI's          Oxygen  Re-Evaluation:  Oxygen  Re-Evaluation     Row Name 11/18/23 1256             Goals/Expected Outcomes   Short Term Goals To learn and demonstrate proper use of respiratory medications;To learn and understand importance of maintaining oxygen  saturations>88%;To learn and demonstrate proper pursed lip breathing techniques or other breathing techniques. ;To learn and understand importance of monitoring SPO2 with pulse oximeter and demonstrate accurate use of the pulse oximeter.       Long  Term Goals Maintenance of O2 saturations>88%;Compliance with respiratory medication;Exhibits proper breathing techniques, such as pursed lip breathing or other method taught during program session;Verbalizes importance of monitoring SPO2 with pulse oximeter and return demonstration;Demonstrates proper use of MDI's       Comments Reviewed PLB technique with pt.  Talked about how it works and it's importance in maintaining their exercise saturations.       Goals/Expected Outcomes Short: Become more profiecient at using PLB.   Long: Become independent at using PLB.          Oxygen  Discharge (Final Oxygen  Re-Evaluation):  Oxygen  Re-Evaluation - 11/18/23 1256       Goals/Expected Outcomes   Short Term Goals To learn and demonstrate proper use of respiratory medications;To learn and understand importance of maintaining oxygen  saturations>88%;To learn and demonstrate proper pursed lip breathing techniques or other breathing techniques. ;To learn and understand  importance of monitoring SPO2 with pulse oximeter and demonstrate accurate use of the pulse oximeter.    Long  Term Goals Maintenance of O2 saturations>88%;Compliance with respiratory medication;Exhibits proper breathing techniques, such as pursed lip breathing or other method taught during program session;Verbalizes importance of monitoring SPO2 with pulse oximeter and return demonstration;Demonstrates proper use  of MDI's    Comments Reviewed PLB technique with pt.  Talked about how it works and it's importance in maintaining their exercise saturations.    Goals/Expected Outcomes Short: Become more profiecient at using PLB.   Long: Become independent at using PLB.          Initial Exercise Prescription:  Initial Exercise Prescription - 11/16/23 1400       Date of Initial Exercise RX and Referring Provider   Date 11/16/23    Referring Provider Candyce Caraway MD   [facility]     Oxygen    Maintain Oxygen  Saturation 88% or higher      Treadmill   MPH 1.2    Grade 0.5    Minutes 15    METs 2      NuStep   Level 1    SPM 80    Minutes 15    METs 2      Prescription Details   Frequency (times per week) 3    Duration Progress to 30 minutes of continuous aerobic without signs/symptoms of physical distress      Intensity   THRR 40-80% of Max Heartrate 97-128    Ratings of Perceived Exertion 11-13    Perceived Dyspnea 0-4      Progression   Progression Continue to progress workloads to maintain intensity without signs/symptoms of physical distress.      Resistance Training   Training Prescription Yes    Weight 4 lb    Reps 10-15          Perform Capillary Blood Glucose checks as needed.  Exercise Prescription Changes:   Exercise Prescription Changes     Row Name 11/16/23 1400 12/12/23 1500           Response to Exercise   Blood Pressure (Admit) 126/74 108/60      Blood Pressure (Exercise) 136/64 --      Blood Pressure (Exit) 112/70 102/60      Heart Rate (Admit) 65  bpm 65 bpm      Heart Rate (Exercise) 102 bpm 77 bpm      Heart Rate (Exit) 71 bpm 77 bpm      Oxygen  Saturation (Admit) 97 % 94 %      Oxygen  Saturation (Exercise) 87 % 95 %      Oxygen  Saturation (Exit) 96 % 96 %      Rating of Perceived Exertion (Exercise) 14 11      Perceived Dyspnea (Exercise) 2 1      Symptoms SOB, leg pain 4/10 (calf) --      Comments walk test results --      Duration -- Continue with 30 min of aerobic exercise without signs/symptoms of physical distress.      Intensity -- THRR unchanged        Progression   Progression -- Continue to progress workloads to maintain intensity without signs/symptoms of physical distress.        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  Patient was oriented to gym and equipment including functions, settings, policies, and procedures.  Patient's individual exercise prescription and treatment plan were reviewed.  All starting workloads were established based on the results of the 6 minute walk test done at initial orientation visit.  The plan for exercise progression was also introduced and progression will be customized based on patient's performance and goals.         Exercise Goals and Review:   Exercise Goals     Row Name 11/16/23 1300             Exercise Goals   Increase Physical Activity Yes       Intervention Provide advice, education, support and counseling about physical activity/exercise needs.;Develop an individualized exercise prescription for aerobic and resistive training based on initial evaluation  findings, risk stratification, comorbidities and participant's personal goals.       Expected Outcomes Short Term: Attend rehab on a regular basis to increase amount of physical activity.;Long Term: Exercising regularly at least 3-5 days a week.;Long Term: Add in home exercise to make exercise part of routine and to increase amount of physical activity.       Increase Strength and Stamina Yes       Intervention Provide advice, education, support and counseling about physical activity/exercise needs.;Develop an individualized exercise prescription for aerobic and resistive training based on initial evaluation findings, risk stratification, comorbidities and participant's personal goals.       Expected Outcomes Short Term: Increase workloads from initial exercise prescription for resistance, speed, and METs.;Short Term: Perform resistance training exercises routinely during rehab and add in resistance training at home;Long Term: Improve cardiorespiratory fitness, muscular endurance and strength as measured by increased METs and functional capacity ( )       Able to understand and use rate of perceived exertion (RPE) scale Yes       Intervention Provide education and explanation on how to use RPE scale       Expected Outcomes Short Term: Able to use RPE daily in rehab to express subjective intensity level;Long Term:  Able to use RPE to guide intensity level when exercising independently       Able to understand and use Dyspnea scale Yes       Intervention Provide education and explanation on how to use Dyspnea scale       Expected Outcomes Short Term: Able to use Dyspnea scale daily in rehab to express subjective sense of shortness of breath during exertion;Long Term: Able to use Dyspnea scale to guide intensity level when exercising independently       Knowledge and understanding of Target Heart Rate Range (THRR) Yes       Intervention Provide education and explanation of THRR including how the numbers  were predicted and where they are located for reference       Expected Outcomes Long Term: Able to use THRR to govern intensity when exercising independently;Short Term: Able to use daily as guideline for intensity in rehab;Short Term: Able to state/look up THRR       Able to check pulse independently Yes       Intervention Provide education and demonstration on how to check pulse in  carotid and radial arteries.;Review the importance of being able to check your own pulse for safety during independent exercise       Expected Outcomes Short Term: Able to explain why pulse checking is important during independent exercise;Long Term: Able to check pulse independently and accurately       Understanding of Exercise Prescription Yes       Intervention Provide education, explanation, and written materials on patient's individual exercise prescription       Expected Outcomes Short Term: Able to explain program exercise prescription;Long Term: Able to explain home exercise prescription to exercise independently          Exercise Goals Re-Evaluation :  Exercise Goals Re-Evaluation     Row Name 11/18/23 1257             Exercise Goal Re-Evaluation   Comments Reviewed RPE and dyspnea scale, THR and program prescription with pt today.  Pt voiced understanding and was given a copy of goals to take home.       Expected Outcomes Short: Use RPE daily to regulate intensity.  Long: Follow program prescription in THR.          Discharge Exercise Prescription (Final Exercise Prescription Changes):  Exercise Prescription Changes - 12/12/23 1500       Response to Exercise   Blood Pressure (Admit) 108/60    Blood Pressure (Exit) 102/60    Heart Rate (Admit) 65 bpm    Heart Rate (Exercise) 77 bpm    Heart Rate (Exit) 77 bpm    Oxygen  Saturation (Admit) 94 %    Oxygen  Saturation (Exercise) 95 %    Oxygen  Saturation (Exit) 96 %    Rating of Perceived Exertion (Exercise) 11    Perceived Dyspnea (Exercise)  1    Duration Continue with 30 min of aerobic exercise without signs/symptoms of physical distress.    Intensity THRR unchanged      Progression   Progression Continue to progress workloads to maintain intensity without signs/symptoms of physical distress.      Resistance Training   Training Prescription Yes    Weight 4    Reps 10-15      NuStep   Level 1    SPM 66    Minutes 15    METs 1.5      Arm Ergometer   Level 2    Watts 22    Minutes 15    METs 1.5          Nutrition:  Target Goals: Understanding of nutrition guidelines, daily intake of sodium 1500mg , cholesterol 200mg , calories 30% from fat and 7% or less from saturated fats, daily to have 5 or more servings of fruits and vegetables.  Biometrics:  Pre Biometrics - 11/16/23 1438       Pre Biometrics   Height 5' 2 (1.575 m)    Weight 152 lb 8 oz (69.2 kg)    Waist Circumference 35 inches    Hip Circumference 37.5 inches    Waist to Hip Ratio 0.93 %    BMI (Calculated) 27.89    Grip Strength 16.4 kg    Flexibility 0 in    Single Leg Stand 7.3 seconds           Nutrition Therapy Plan and Nutrition Goals:  Nutrition Therapy & Goals - 11/16/23 1453       Intervention Plan   Intervention Prescribe, educate and counsel regarding individualized specific dietary modifications aiming towards targeted core components such as  weight, hypertension, lipid management, diabetes, heart failure and other comorbidities.;Nutrition handout(s) given to patient.    Expected Outcomes Short Term Goal: Understand basic principles of dietary content, such as calories, fat, sodium, cholesterol and nutrients.;Long Term Goal: Adherence to prescribed nutrition plan.          Nutrition Assessments:  MEDIFICTS Score Key: >=70 Need to make dietary changes  40-70 Heart Healthy Diet <= 40 Therapeutic Level Cholesterol Diet  Flowsheet Row PULMONARY REHAB COPD ORIENTATION from 11/16/2023 in [facility] CARDIAC REH[facility]ILITATION   Picture Your Plate Total Score on Admission 46   Picture Your Plate Scores: <59 Unhealthy dietary pattern with much room for improvement. 41-50 Dietary pattern unlikely to meet recommendations for good health and room for improvement. 51-60 More healthful dietary pattern, with some room for improvement.  >60 Healthy dietary pattern, although there may be some specific behaviors that could be improved.    Nutrition Goals Re-Evaluation:   Nutrition Goals Discharge (Final Nutrition Goals Re-Evaluation):   Psychosocial: Target Goals: Acknowledge presence or absence of significant depression and/or stress, maximize coping skills, provide positive support system. Participant is able to verbalize types and ability to use techniques and skills needed for reducing stress and depression.  Initial Review & Psychosocial Screening:  Initial Psych Review & Screening - 11/16/23 1303       Initial Review   Current issues with History of Depression;Current Depression;Current Stress Concerns;Current Psychotropic Meds;Current Sleep Concerns    Source of Stress Concerns Family;Chronic Illness;Unable to participate in former interests or hobbies;Unable to perform yard/household activities    Comments granddaughter 61 that likes to push his buttons, worries about his wife's health as it declining (stage III kidney disease and SOB), trouble sleeping [facility] on buproion helps some      Family Dynamics   Good Support System? Yes   wife, daughter   Comments wife drives him back and forth      Barriers   Psychosocial barriers to participate in program Psychosocial barriers identified (see note);The patient should benefit from training in stress management and relaxation.      Screening Interventions   Interventions Encouraged to exercise;To provide support and resources with identified psychosocial needs;Provide feedback about the scores to participant    Expected Outcomes Short Term goal:  Utilizing psychosocial counselor, staff and physician to assist with identification of specific Stressors or current issues interfering with healing process. Setting desired goal for each stressor or current issue identified.;Long Term Goal: Stressors or current issues are controlled or eliminated.;Short Term goal: Identification and review with participant of any Quality of Life or Depression concerns found by scoring the questionnaire.;Long Term goal: The participant improves quality of Life and PHQ9 Scores as seen by post scores and/or verbalization of changes          Quality of Life Scores:  Scores of 19 and below usually indicate a poorer quality of life in these areas.  A difference of  2-3 points is a clinically meaningful difference.  A difference of 2-3 points in the total score of the Quality of Life Index has been associated with significant improvement in overall quality of life, self-image, physical symptoms, and general health in studies assessing change in quality of life.  PHQ-9: Review Flowsheet       11/16/2023 12/07/2012 11/21/2012  Depression screen PHQ 2/9  Decreased Interest 3 0 0  Down, Depressed, Hopeless 2 0 1  PHQ - 2 Score 5 0 1  Altered sleeping 3 - -  Tired, decreased energy 2 - -  Change in appetite 3 - -  Feeling bad or failure about yourself  2 - -  Trouble concentrating 3 - -  Moving slowly or fidgety/restless 3 - -  Suicidal thoughts 0 - -  PHQ-9 Score 21 - -  Difficult doing work/chores Very difficult - -   Interpretation of Total Score  Total Score Depression Severity:  1-4 = Minimal depression, 5-9 = Mild depression, 10-14 = Moderate depression, 15-19 = Moderately severe depression, 20-27 = Severe depression   Psychosocial Evaluation and Intervention:  Psychosocial Evaluation - 11/16/23 1438       Psychosocial Evaluation & Interventions   Interventions Stress management education;Relaxation education;Encouraged to exercise with the program and  follow exercise prescription    Comments Oscar is coming into pulmonary rehab for COPD through the [facility].  He did the program previously in 2014.  He is looking forward to attending.  He really wants to work on his breathing and his mobility.  He is also hoping to be able to work on getting back to singing again as he misses singing in the church choir.  He lives at home with his wife who also has significant health issues.  He worries about her health as she has stage III kidney disease that went undiagnosised for 3 years.  He also has a granddaughter that stays with them on occassion and likes to push her limits.  She is 13 and he says shes going on 34!  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 He does see a councelor monthly through the [facility] and is on bipropion for this. He says it helps some but not completely.  His wife usually drives him to his appointments but also goes with their daughter and her son to appointments.  So appointment schedules could cause some attendance conflicts, but other wise he does not see any issues getting to rehab as long as calendar is free.  He has two dogs, a rotwiller and boxer, that he enjoys walking (or being dragged by) and spending time with them.  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    Expected Outcomes short: Attend rehab to build stamina LOng: Work on improving his breathing    Continue Psychosocial Services  Follow up required by staff          Psychosocial Re-Evaluation:   Psychosocial Discharge (Final Psychosocial Re-Evaluation):   Education: Education Goals: Education classes will be provided on a weekly basis, covering required topics. Participant will state understanding/return demonstration of topics presented.  Learning Barriers/Preferences:  Learning Barriers/Preferences - 11/16/23 1454       Learning Barriers/Preferences   Learning Barriers None    Learning Preferences None           Education Topics: Know Your Numbers Group instruction that is supported by a PowerPoint presentation. Instructor discusses importance of knowing and understanding resting, exercise, and post-exercise oxygen  saturation, heart rate, and blood pressure. Oxygen  saturation, heart rate, blood pressure, rating of perceived exertion, and dyspnea are reviewed along with a normal range for these values.    Exercise for the Pulmonary Patient Group instruction that is supported by a PowerPoint presentation. Instructor discusses benefits of exercise, core components of exercise, frequency, duration, and intensity of an exercise routine, importance of utilizing pulse oximetry during exercise, safety while exercising, and options of places to exercise outside of rehab.  MET Level  Group instruction provided by PowerPoint, verbal discussion, and written material to support subject matter. Instructor reviews what METs are and how to increase METs.    Pulmonary Medications Verbally interactive group education provided by instructor with focus on inhaled medications and proper administration.   Anatomy and Physiology of the Respiratory System Group instruction provided by PowerPoint, verbal discussion, and written material to support subject matter. Instructor reviews respiratory cycle and anatomical components of the respiratory system and their functions. Instructor also reviews differences in obstructive and restrictive respiratory diseases with examples of each.    Oxygen  Safety Group instruction provided by PowerPoint, verbal discussion, and written material to support subject matter. There is an overview of "What is Oxygen " and "Why do we need it".  Instructor also reviews how to create a safe environment for oxygen  use, the importance of using oxygen  as prescribed, and the risks of noncompliance. There is a brief discussion on traveling with oxygen  and resources the patient may  utilize.   Oxygen  Use Group instruction provided by PowerPoint, verbal discussion, and written material to discuss how supplemental oxygen  is prescribed and different types of oxygen  supply systems. Resources for more information are provided.    Breathing Techniques Group instruction that is supported by demonstration and informational handouts. Instructor discusses the benefits of pursed lip and diaphragmatic breathing and detailed demonstration on how to perform both.     Risk Factor Reduction Group instruction that is supported by a PowerPoint presentation. Instructor discusses the definition of a risk factor, different risk factors for pulmonary disease, and how the heart and lungs work together.   Pulmonary Diseases Group instruction provided by PowerPoint, verbal discussion, and written material to support subject matter. Instructor gives an overview of the different type of pulmonary diseases. There is also a discussion on risk factors and symptoms as well as ways to manage the diseases.   Stress and Energy Conservation Group instruction provided by PowerPoint, verbal discussion, and written material to support subject matter. Instructor gives an overview of stress and the impact it can have on the body. Instructor also reviews ways to reduce stress. There is also a discussion on energy conservation and ways to conserve energy throughout the day.   Warning Signs and Symptoms Group instruction provided by PowerPoint, verbal discussion, and written material to support subject matter. Instructor reviews warning signs and symptoms of stroke, heart attack, cold and flu. Instructor also reviews ways to prevent the spread of infection.   Other Education Group or individual verbal, written, or video instructions that support the educational goals of the pulmonary rehab program.    Knowledge Questionnaire Score:  Knowledge Questionnaire Score - 11/16/23 1454       Knowledge  Questionnaire Score   Pre Score 15/18          Core Components/Risk Factors/Patient Goals at Admission:  Personal Goals and Risk Factors at Admission - 11/16/23 1454       Core Components/Risk Factors/Patient Goals on Admission    Weight Management Yes;Weight Maintenance    Intervention Weight Management: Develop a combined nutrition and exercise program designed to reach desired caloric intake, while maintaining appropriate intake of nutrient and fiber, sodium and fats, and appropriate energy expenditure required for the weight goal.;Weight Management: Provide education and appropriate resources to help participant work on and attain dietary goals.    Admit Weight 152 lb 8 oz (69.2 kg)    Goal Weight: Short Term 150 lb (68 kg)    Goal  Weight: Long Term 150 lb (68 kg)    Expected Outcomes Short Term: Continue to assess and modify interventions until short term weight is achieved;Long Term: Adherence to nutrition and physical activity/exercise program aimed toward attainment of established weight goal;Weight Maintenance: Understanding of the daily nutrition guidelines, which includes 25-35% calories from fat, 7% or less cal from saturated fats, less than 200mg  cholesterol, less than 1.5gm of sodium, & 5 or more servings of fruits and vegetables daily    Improve shortness of breath with ADL's Yes    Intervention Provide education, individualized exercise plan and daily activity instruction to help decrease symptoms of SOB with activities of daily living.    Expected Outcomes Short Term: Improve cardiorespiratory fitness to achieve a reduction of symptoms when performing ADLs;Long Term: Be able to perform more ADLs without symptoms or delay the onset of symptoms    Increase knowledge of respiratory medications and ability to use respiratory devices properly  Yes    Intervention Provide education and demonstration as needed of appropriate use of medications, inhalers, and oxygen  therapy.     Expected Outcomes Short Term: Achieves understanding of medications use. Understands that oxygen  is a medication prescribed by physician. Demonstrates appropriate use of inhaler and oxygen  therapy.;Long Term: Maintain appropriate use of medications, inhalers, and oxygen  therapy.    Diabetes --   Hx of DM but no meds currently   Hypertension Yes    Intervention Provide education on lifestyle modifcations including regular physical activity/exercise, weight management, moderate sodium restriction and increased consumption of fresh fruit, vegetables, and low fat dairy, alcohol moderation, and smoking cessation.;Monitor prescription use compliance.    Expected Outcomes Long Term: Maintenance of blood pressure at goal levels.;Short Term: Continued assessment and intervention until BP is < 140/45mm HG in hypertensive participants. < 130/42mm HG in hypertensive participants with diabetes, heart failure or chronic kidney disease.    Lipids Yes    Intervention Provide education and support for participant on nutrition & aerobic/resistive exercise along with prescribed medications to achieve LDL 70mg , HDL >40mg .    Expected Outcomes Short Term: Participant states understanding of desired cholesterol values and is compliant with medications prescribed. Participant is following exercise prescription and nutrition guidelines.;Long Term: Cholesterol controlled with medications as prescribed, with individualized exercise RX and with personalized nutrition plan. Value goals: LDL < 70mg , HDL > 40 mg.          Core Components/Risk Factors/Patient Goals Review:    Core Components/Risk Factors/Patient Goals at Discharge (Final Review):    ITP Comments:  ITP Comments     Row Name 11/16/23 1433 11/18/23 1255 11/30/23 1511 12/28/23 0829     ITP Comments Patient attend orientation today.  Patient is attending Pulmonary Rehabilitation Program.  Documentation for diagnosis can be found in scanned into media from [facility].   Reviewed medical chart, RPE/RPD, gym safety, and program guidelines.  Patient was fitted to equipment they will be using during rehab.  Patient is scheduled to start exercise on Friday 11/18/23 at 1pm.   Initial ITP created and sent for review and signature by Dr. Anton Kelp, Medical Director for Pulmonary Rehabilitation Program. First full day of exercise!  Patient was oriented to gym and equipment including functions, settings, policies, and procedures.  Patient's individual exercise prescription and treatment plan were reviewed.  All starting workloads were established based on the results of the 6 minute walk test done at initial orientation visit.  The plan for exercise progression was also introduced and progression will be  customized based on patient's performance and goals. 30 day review completed. ITP sent to Dr.Jehanzeb Memon, Medical Director of  Pulmonary Rehab. Continue with ITP unless changes are made by physician.  Newer to prorgam 30 day review completed. ITP sent to Dr.Jehanzeb Memon, Medical Director of  Pulmonary Rehab. Continue with ITP unless changes are made by physician.  Newer to the program.       Comments: 30 day review

## 2023-12-30 ENCOUNTER — Encounter (HOSPITAL_COMMUNITY)

## 2024-01-02 ENCOUNTER — Encounter (HOSPITAL_COMMUNITY)

## 2024-01-04 ENCOUNTER — Encounter (HOSPITAL_COMMUNITY)

## 2024-01-06 ENCOUNTER — Encounter (HOSPITAL_COMMUNITY)

## 2024-01-09 ENCOUNTER — Encounter (HOSPITAL_COMMUNITY)

## 2024-01-11 ENCOUNTER — Encounter (HOSPITAL_COMMUNITY)

## 2024-01-13 ENCOUNTER — Encounter (HOSPITAL_COMMUNITY)

## 2024-01-16 ENCOUNTER — Encounter (HOSPITAL_COMMUNITY)

## 2024-01-18 ENCOUNTER — Encounter (HOSPITAL_COMMUNITY)

## 2024-01-23 ENCOUNTER — Encounter (HOSPITAL_COMMUNITY)

## 2024-01-24 ENCOUNTER — Encounter (HOSPITAL_COMMUNITY): Payer: Self-pay | Admitting: *Deleted

## 2024-01-24 DIAGNOSIS — J449 Chronic obstructive pulmonary disease, unspecified: Secondary | ICD-10-CM

## 2024-01-24 NOTE — Progress Notes (Signed)
 Pulmonary Individual Treatment Plan  Patient Details  Name: Mark Schroeder MRN: [id number] Date of Birth: Feb 05, 1946 Referring Provider:   Flowsheet Row PULMONARY REHAB COPD ORIENTATION from 11/16/2023 in [facility]AC REHABILITATION  Referring Provider Candyce Caraway MD  [[facility]]    Initial Encounter Date:  Flowsheet Row PULMONARY REHAB COPD ORIENTATION from 11/16/2023 in [facility] CARDIAC REHABILITATION  Date 11/16/23    Visit Diagnosis: Chronic obstructive pulmonary disease, unspecified COPD type (HCC)  Patient's Home Medications on Admission:   Current Outpatient Medications:    acetaminophen  (TYLENOL ) 325 MG tablet, Take 2 tablets (650 mg total) by mouth every 6 (six) hours as needed for mild pain (or Fever >/= 101)., Disp: 12 tablet, Rfl: 1   albuterol  (PROVENTIL ) (2.5 MG/3ML) 0.083% nebulizer solution, Take 3 mLs (2.5 mg total) by nebulization 3 (three) times daily as needed for wheezing or shortness of breath., Disp: 75 mL, Rfl: 12   amLODipine  (NORVASC ) 10 MG tablet, Take 5 mg by mouth daily., Disp: , Rfl:    aspirin  EC 81 MG tablet, Take 1 tablet (81 mg total) by mouth daily with breakfast., Disp: 30 tablet, Rfl: 2   atorvastatin  (LIPITOR) 40 MG tablet, Take 20 mg by mouth at bedtime., Disp: , Rfl:    azelastine (ASTELIN) 0.1 % nasal spray, Place 2 sprays into both nostrils 2 (two) times daily. Use in each nostril as directed, Disp: , Rfl:    azithromycin  (ZITHROMAX ) 250 MG tablet, Take 250 mg by mouth 3 (three) times a week. M,W,F, Disp: , Rfl:    bacitracin ointment, Apply 1 Application topically 2 (two) times daily as needed (skin abrasion)., Disp: , Rfl:    Budeson-Glycopyrrol-Formoterol  (BREZTRI AEROSPHERE) 160-9-4.8 MCG/ACT AERO, Inhale 2 puffs into the lungs in the morning and at bedtime., Disp: , Rfl:    budesonide -formoterol  (SYMBICORT ) 160-4.5 MCG/ACT inhaler, Inhale 2 puffs into the lungs 2 (two) times daily., Disp: 1 each, Rfl: 12   buPROPion  (ZYBAN ) 150 MG 12 hr  tablet, Take 150 mg by mouth daily., Disp: , Rfl:    carboxymethylcellulose (REFRESH PLUS) 0.5 % SOLN, Place 1 drop into both eyes 4 (four) times daily as needed (for dry eyes)., Disp: , Rfl:    carvedilol  (COREG ) 25 MG tablet, Take 1 tablet (25 mg total) by mouth 2 (two) times daily with a meal., Disp: 60 tablet, Rfl: 1   cetirizine (ZYRTEC) 10 MG tablet, Take 10 mg by mouth at bedtime., Disp: , Rfl:    Cholecalciferol  (VITAMIN D  PO), Take 1,000 Units by mouth daily., Disp: , Rfl:    divalproex  (DEPAKOTE  ER) 500 MG 24 hr tablet, Take 500 mg by mouth daily., Disp: , Rfl:    Emollient (EUCERIN DAILY HYDRATION) CREA, Apply 1 Application topically in the morning and at bedtime., Disp: , Rfl:    famotidine  (PEPCID ) 40 MG tablet, Take 40 mg by mouth 2 (two) times daily., Disp: , Rfl:    finasteride (PROSCAR) 5 MG tablet, Take 5 mg by mouth daily., Disp: , Rfl:    gabapentin  (NEURONTIN ) 300 MG capsule, Take 300 mg by mouth at bedtime., Disp: , Rfl:    guaiFENesin  (ROBITUSSIN) 100 MG/5ML liquid, Take 10 mLs by mouth every 4 (four) hours as needed for cough or to loosen phlegm., Disp: , Rfl:    ipratropium (ATROVENT ) 0.06 % nasal spray, Place 2 sprays into both nostrils in the morning and at bedtime., Disp: , Rfl:    lactobacillus acidophilus (BACID) TABS tablet, Take 1 tablet by mouth  daily., Disp: , Rfl:    loratadine  (CLARITIN ) 10 MG tablet, Take 1 tablet (10 mg total) by mouth daily., Disp: 30 tablet, Rfl: 0   Melatonin 5 MG TABS, Take 10 mg by mouth at bedtime., Disp: , Rfl:    pantoprazole  (PROTONIX ) 40 MG tablet, Take 40 mg by mouth daily., Disp: , Rfl:    predniSONE  (DELTASONE ) 50 MG tablet, Take 1 tablet (50 mg total) by mouth 2 (two) times daily with a meal., Disp: 14 tablet, Rfl: 0   tamsulosin  (FLOMAX ) 0.4 MG CAPS capsule, Take 0.8 mg by mouth daily after supper., Disp: , Rfl:   Past Medical History: Past Medical History:  Diagnosis Date   COPD (chronic obstructive pulmonary disease) (HCC)     Depression    Diabetic peripheral neuropathy (HCC)    Hyperlipidemia    Hypertension    PTSD (post-traumatic stress disorder)    Type 2 diabetes mellitus (HCC)     Tobacco Use: Social History   Tobacco Use  Smoking Status Former   Current packs/day: 0.25   Average packs/day: 0.3 packs/day for 35.0 years (8.8 ttl pk-yrs)   Types: Cigarettes  Smokeless Tobacco Never    Labs: Review Flowsheet  More data may exist      Latest Ref Rng & Units 08/15/2014 04/14/2019 08/15/2019 03/05/2020 04/17/2023  Labs for ITP Cardiac and Pulmonary Rehab  Trlycerides <150 mg/dL - - 877  812  -  Hemoglobin A1c 4.8 - 5.6 % 6.7  6.8  6.9  6.7  6.3   PH, Arterial 7.350 - 7.450 - - - 7.362  -  PCO2 arterial 32.0 - 48.0 mmHg - - - 58.9  -  Bicarbonate 20.0 - 28.0 mmol/L - - - 30.0  -  O2 Saturation % - - - 96.2  -    Capillary Blood Glucose: Lab Results  Component Value Date   GLUCAP 185 (H) 04/18/2023   GLUCAP 218 (H) 04/17/2023   GLUCAP 133 (H) 04/17/2023   GLUCAP 117 (H) 04/17/2023   GLUCAP 83 04/17/2023     Pulmonary Assessment Scores:  Pulmonary Assessment Scores     Row Name 11/16/23 1456         ADL UCSD   ADL Phase Entry     SOB Score total 94     Rest 2     Walk 3     Stairs 5     Bath 4     Dress 4     Shop 4       CAT Score   CAT Score 18       mMRC Score   mMRC Score 4       UCSD: Self-administered rating of dyspnea associated with activities of daily living (ADLs) 6-point scale (0 = not at all to 5 = maximal or unable to do because of breathlessness)  Scoring Scores range from 0 to 120.  Minimally important difference is 5 units  CAT: CAT can identify the health impairment of COPD patients and is better correlated with disease progression.  CAT has a scoring range of zero to 40. The CAT score is classified into four groups of low (less than 10), medium (10 - 20), high (21-30) and very high (31-40) based on the impact level of disease on health status. A  CAT score over 10 suggests significant symptoms.  A worsening CAT score could be explained by an exacerbation, poor medication adherence, poor inhaler technique, or progression of COPD or comorbid conditions.  CAT MCID is 2 points  mMRC: mMRC (Modified Medical Research Council) Dyspnea Scale is used to assess the degree of baseline functional disability in patients of respiratory disease due to dyspnea. No minimal important difference is established. A decrease in score of 1 point or greater is considered a positive change.   Pulmonary Function Assessment:  Pulmonary Function Assessment - 11/16/23 1455       Pulmonary Function Tests   FVC% 70 %   08/06/15   FEV1% 40 %    FEV1/FVC Ratio 45    RV% 124 %    DLCO% 55 %      Breath   Bilateral Breath Sounds Decreased;Wheezes;Inspiratory;Expiratory    Shortness of Breath Yes;Fear of Shortness of Breath;Limiting activity;Panic with Shortness of Breath          Exercise Target Goals: Exercise Program Goal: Individual exercise prescription set using results from initial 6 min walk test and THRR while considering  patient's activity barriers and safety.   Exercise Prescription Goal: Initial exercise prescription builds to 30-45 minutes a day of aerobic activity, 2-3 days per week.  Home exercise guidelines will be given to patient during program as part of exercise prescription that the participant will acknowledge.  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 HR 102 bpm     Max Ex. BP 136/64     2 Minute Post BP 128/62       Interval HR   1 Minute HR 101     2 Minute HR 92     3 Minute HR 102     4 Minute HR 99     5 Minute HR 98     6 Minute HR 102     2 Minute Post HR 77     Interval Heart Rate? Yes       Interval Oxygen    Interval Oxygen ? Yes     Baseline Oxygen  Saturation % 97 %     1 Minute Oxygen  Saturation % 93 %     1 Minute Liters of Oxygen  0 L  Room Air     2 Minute Oxygen  Saturation % 93 %     2 Minute Liters of Oxygen  0 L  3 Minute Oxygen  Saturation % 91 %     3 Minute Liters of Oxygen  0 L     4 Minute Oxygen  Saturation % 89 %     4 Minute Liters of Oxygen  0 L     5 Minute Oxygen  Saturation % 89 %     5 Minute Liters of Oxygen  0 L     6 Minute Oxygen  Saturation % 87 %     6 Minute Liters of Oxygen  0 L     2 Minute Post Oxygen  Saturation % 96 %     2 Minute Post Liters of Oxygen  0 L        Oxygen  Initial Assessment:  Oxygen  Initial Assessment - 11/16/23 1455       Home Oxygen    Home Oxygen  Device None    Sleep Oxygen  Prescription None    Home Exercise Oxygen  Prescription None    Home Resting Oxygen  Prescription None      Initial 6 min Walk   Oxygen  Used None      Program Oxygen  Prescription   Program Oxygen  Prescription None      Intervention   Short Term Goals To learn and demonstrate proper use of respiratory medications;To learn and understand importance of maintaining oxygen  saturations>88%;To learn and demonstrate proper pursed lip breathing techniques or other breathing techniques. ;To  learn and understand importance of monitoring SPO2 with pulse oximeter and demonstrate accurate use of the pulse oximeter.    Long  Term Goals Maintenance of O2 saturations>88%;Compliance with respiratory medication;Exhibits proper breathing techniques, such as pursed lip breathing or other method taught during program session;Verbalizes importance of monitoring SPO2 with pulse oximeter and return demonstration;Demonstrates proper use of MDI's          Oxygen  Re-Evaluation:  Oxygen  Re-Evaluation     Row Name 11/18/23 1256             Goals/Expected Outcomes   Short Term Goals To learn and demonstrate proper use of respiratory medications;To learn and understand importance of maintaining oxygen  saturations>88%;To learn and demonstrate proper pursed lip breathing techniques or other breathing techniques. ;To learn and understand importance of monitoring SPO2 with pulse oximeter and demonstrate accurate use of the pulse oximeter.       Long  Term Goals Maintenance of O2 saturations>88%;Compliance with respiratory medication;Exhibits proper breathing techniques, such as pursed lip breathing or other method taught during program session;Verbalizes importance of monitoring SPO2 with pulse oximeter and return demonstration;Demonstrates proper use of MDI's       Comments Reviewed PLB technique with pt.  Talked about how it works and it's importance in maintaining their exercise saturations.       Goals/Expected Outcomes Short: Become more profiecient at using PLB.   Long: Become independent at using PLB.          Oxygen  Discharge (Final Oxygen  Re-Evaluation):  Oxygen  Re-Evaluation - 11/18/23 1256       Goals/Expected Outcomes   Short Term Goals To learn and demonstrate proper use of respiratory medications;To learn and understand importance of maintaining oxygen  saturations>88%;To learn and demonstrate proper pursed lip breathing techniques or other breathing techniques. ;To learn and understand  importance of monitoring SPO2 with pulse oximeter and demonstrate accurate use of the pulse oximeter.    Long  Term Goals Maintenance of O2 saturations>88%;Compliance with respiratory medication;Exhibits proper breathing techniques, such as pursed lip breathing or other method taught during program session;Verbalizes importance of monitoring SPO2 with pulse oximeter and return demonstration;Demonstrates proper use  of MDI's    Comments Reviewed PLB technique with pt.  Talked about how it works and it's importance in maintaining their exercise saturations.    Goals/Expected Outcomes Short: Become more profiecient at using PLB.   Long: Become independent at using PLB.          Initial Exercise Prescription:  Initial Exercise Prescription - 11/16/23 1400       Date of Initial Exercise RX and Referring Provider   Date 11/16/23    Referring Provider Candyce Caraway MD   [facility]     Oxygen    Maintain Oxygen  Saturation 88% or higher      Treadmill   MPH 1.2    Grade 0.5    Minutes 15    METs 2      NuStep   Level 1    SPM 80    Minutes 15    METs 2      Prescription Details   Frequency (times per week) 3    Duration Progress to 30 minutes of continuous aerobic without signs/symptoms of physical distress      Intensity   THRR 40-80% of Max Heartrate 97-128    Ratings of Perceived Exertion 11-13    Perceived Dyspnea 0-4      Progression   Progression Continue to progress workloads to maintain intensity without signs/symptoms of physical distress.      Resistance Training   Training Prescription Yes    Weight 4 lb    Reps 10-15          Perform Capillary Blood Glucose checks as needed.  Exercise Prescription Changes:   Exercise Prescription Changes     Row Name 11/16/23 1400 12/12/23 1500           Response to Exercise   Blood Pressure (Admit) 126/74 108/60      Blood Pressure (Exercise) 136/64 --      Blood Pressure (Exit) 112/70 102/60      Heart Rate (Admit) 65  bpm 65 bpm      Heart Rate (Exercise) 102 bpm 77 bpm      Heart Rate (Exit) 71 bpm 77 bpm      Oxygen  Saturation (Admit) 97 % 94 %      Oxygen  Saturation (Exercise) 87 % 95 %      Oxygen  Saturation (Exit) 96 % 96 %      Rating of Perceived Exertion (Exercise) 14 11      Perceived Dyspnea (Exercise) 2 1      Symptoms SOB, leg pain 4/10 (calf) --      Comments walk test results --      Duration -- Continue with 30 min of aerobic exercise without signs/symptoms of physical distress.      Intensity -- THRR unchanged        Progression   Progression -- Continue to progress workloads to maintain intensity without signs/symptoms of physical distress.        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  Patient was oriented to gym and equipment including functions, settings, policies, and procedures.  Patient's individual exercise prescription and treatment plan were reviewed.  All starting workloads were established based on the results of the 6 minute walk test done at initial orientation visit.  The plan for exercise progression was also introduced and progression will be customized based on patient's performance and goals.         Exercise Goals and Review:   Exercise Goals     Row Name 11/16/23 1300             Exercise Goals   Increase Physical Activity Yes       Intervention Provide advice, education, support and counseling about physical activity/exercise needs.;Develop an individualized exercise prescription for aerobic and resistive training based on initial evaluation  findings, risk stratification, comorbidities and participant's personal goals.       Expected Outcomes Short Term: Attend rehab on a regular basis to increase amount of physical activity.;Long Term: Exercising regularly at least 3-5 days a week.;Long Term: Add in home exercise to make exercise part of routine and to increase amount of physical activity.       Increase Strength and Stamina Yes       Intervention Provide advice, education, support and counseling about physical activity/exercise needs.;Develop an individualized exercise prescription for aerobic and resistive training based on initial evaluation findings, risk stratification, comorbidities and participant's personal goals.       Expected Outcomes Short Term: Increase workloads from initial exercise prescription for resistance, speed, and METs.;Short Term: Perform resistance training exercises routinely during rehab and add in resistance training at home;Long Term: Improve cardiorespiratory fitness, muscular endurance and strength as measured by increased METs and functional capacity ( )       Able to understand and use rate of perceived exertion (RPE) scale Yes       Intervention Provide education and explanation on how to use RPE scale       Expected Outcomes Short Term: Able to use RPE daily in rehab to express subjective intensity level;Long Term:  Able to use RPE to guide intensity level when exercising independently       Able to understand and use Dyspnea scale Yes       Intervention Provide education and explanation on how to use Dyspnea scale       Expected Outcomes Short Term: Able to use Dyspnea scale daily in rehab to express subjective sense of shortness of breath during exertion;Long Term: Able to use Dyspnea scale to guide intensity level when exercising independently       Knowledge and understanding of Target Heart Rate Range (THRR) Yes       Intervention Provide education and explanation of THRR including how the numbers  were predicted and where they are located for reference       Expected Outcomes Long Term: Able to use THRR to govern intensity when exercising independently;Short Term: Able to use daily as guideline for intensity in rehab;Short Term: Able to state/look up THRR       Able to check pulse independently Yes       Intervention Provide education and demonstration on how to check pulse in  carotid and radial arteries.;Review the importance of being able to check your own pulse for safety during independent exercise       Expected Outcomes Short Term: Able to explain why pulse checking is important during independent exercise;Long Term: Able to check pulse independently and accurately       Understanding of Exercise Prescription Yes       Intervention Provide education, explanation, and written materials on patient's individual exercise prescription       Expected Outcomes Short Term: Able to explain program exercise prescription;Long Term: Able to explain home exercise prescription to exercise independently          Exercise Goals Re-Evaluation :  Exercise Goals Re-Evaluation     Row Name 11/18/23 1257             Exercise Goal Re-Evaluation   Comments Reviewed RPE and dyspnea scale, THR and program prescription with pt today.  Pt voiced understanding and was given a copy of goals to take home.       Expected Outcomes Short: Use RPE daily to regulate intensity.  Long: Follow program prescription in THR.          Discharge Exercise Prescription (Final Exercise Prescription Changes):  Exercise Prescription Changes - 12/12/23 1500       Response to Exercise   Blood Pressure (Admit) 108/60    Blood Pressure (Exit) 102/60    Heart Rate (Admit) 65 bpm    Heart Rate (Exercise) 77 bpm    Heart Rate (Exit) 77 bpm    Oxygen  Saturation (Admit) 94 %    Oxygen  Saturation (Exercise) 95 %    Oxygen  Saturation (Exit) 96 %    Rating of Perceived Exertion (Exercise) 11    Perceived Dyspnea (Exercise)  1    Duration Continue with 30 min of aerobic exercise without signs/symptoms of physical distress.    Intensity THRR unchanged      Progression   Progression Continue to progress workloads to maintain intensity without signs/symptoms of physical distress.      Resistance Training   Training Prescription Yes    Weight 4    Reps 10-15      NuStep   Level 1    SPM 66    Minutes 15    METs 1.5      Arm Ergometer   Level 2    Watts 22    Minutes 15    METs 1.5          Nutrition:  Target Goals: Understanding of nutrition guidelines, daily intake of sodium 1500mg , cholesterol 200mg , calories 30% from fat and 7% or less from saturated fats, daily to have 5 or more servings of fruits and vegetables.  Biometrics:  Pre Biometrics - 11/16/23 1438       Pre Biometrics   Height 5' 2 (1.575 m)    Weight 152 lb 8 oz (69.2 kg)    Waist Circumference 35 inches    Hip Circumference 37.5 inches    Waist to Hip Ratio 0.93 %    BMI (Calculated) 27.89    Grip Strength 16.4 kg    Flexibility 0 in    Single Leg Stand 7.3 seconds           Nutrition Therapy Plan and Nutrition Goals:  Nutrition Therapy & Goals - 11/16/23 1453       Intervention Plan   Intervention Prescribe, educate and counsel regarding individualized specific dietary modifications aiming towards targeted core components such as  weight, hypertension, lipid management, diabetes, heart failure and other comorbidities.;Nutrition handout(s) given to patient.    Expected Outcomes Short Term Goal: Understand basic principles of dietary content, such as calories, fat, sodium, cholesterol and nutrients.;Long Term Goal: Adherence to prescribed nutrition plan.          Nutrition Assessments:  MEDIFICTS Score Key: >=70 Need to make dietary changes  40-70 Heart Healthy Diet <= 40 Therapeutic Level Cholesterol Diet  Flowsheet Row PULMONARY REHAB COPD ORIENTATION from 11/16/2023 in [facility]ARD[facility]   Picture Your Plate Total Score on Admission 46   Picture Your Plate Scores: <59 Unhealthy dietary pattern with much room for improvement. 41-50 Dietary pattern unlikely to meet recommendations for good health and room for improvement. 51-60 More healthful dietary pattern, with some room for improvement.  >60 Healthy dietary pattern, although there may be some specific behaviors that could be improved.    Nutrition Goals Re-Evaluation:   Nutrition Goals Discharge (Final Nutrition Goals Re-Evaluation):   Psychosocial: Target Goals: Acknowledge presence or absence of significant depression and/or stress, maximize coping skills, provide positive support system. Participant is able to verbalize types and ability to use techniques and skills needed for reducing stress and depression.  Initial Review & Psychosocial Screening:  Initial Psych Review & Screening - 11/16/23 1303       Initial Review   Current issues with History of Depression;Current Depression;Current Stress Concerns;Current Psychotropic Meds;Current Sleep Concerns    Source of Stress Concerns Family;Chronic Illness;Unable to participate in former interests or hobbies;Unable to perform yard/household activities    Comments granddaughter 66 that likes to push his buttons, worries about his wife's health as it declining (stage III kidney disease and SOB), trouble sleeping [facility] on buproion helps some      Family Dynamics   Good Support System? Yes   wife, daughter   Comments wife drives him back and forth      Barriers   Psychosocial barriers to participate in program Psychosocial barriers identified (see note);The patient should benefit from training in stress management and relaxation.      Screening Interventions   Interventions Encouraged to exercise;To provide support and resources with identified psychosocial needs;Provide feedback about the scores to participant    Expected Outcomes Short Term goal:  Utilizing psychosocial counselor, staff and physician to assist with identification of specific Stressors or current issues interfering with healing process. Setting desired goal for each stressor or current issue identified.;Long Term Goal: Stressors or current issues are controlled or eliminated.;Short Term goal: Identification and review with participant of any Quality of Life or Depression concerns found by scoring the questionnaire.;Long Term goal: The participant improves quality of Life and PHQ9 Scores as seen by post scores and/or verbalization of changes          Quality of Life Scores:  Scores of 19 and below usually indicate a poorer quality of life in these areas.  A difference of  2-3 points is a clinically meaningful difference.  A difference of 2-3 points in the total score of the Quality of Life Index has been associated with significant improvement in overall quality of life, self-image, physical symptoms, and general health in studies assessing change in quality of life.   PHQ-9: Review Flowsheet       11/16/2023 12/07/2012 11/21/2012  Depression screen PHQ 2/9  Decreased Interest 3 0 0  Down, Depressed, Hopeless 2 0 1  PHQ - 2 Score 5 0 1  Altered sleeping 3 - -  Tired, decreased energy 2 - -  Change in appetite 3 - -  Feeling bad or failure about yourself  2 - -  Trouble concentrating 3 - -  Moving slowly or fidgety/restless 3 - -  Suicidal thoughts 0 - -  PHQ-9 Score 21  - -  Difficult doing work/chores Very difficult - -    Details       Data saved with a previous flowsheet row definition        Interpretation of Total Score  Total Score Depression Severity:  1-4 = Minimal depression, 5-9 = Mild depression, 10-14 = Moderate depression, 15-19 = Moderately severe depression, 20-27 = Severe depression   Psychosocial Evaluation and Intervention:  Psychosocial Evaluation - 11/16/23 1438       Psychosocial Evaluation & Interventions   Interventions Stress  management education;Relaxation education;Encouraged to exercise with the program and follow exercise prescription    Comments Tarrance is coming into pulmonary rehab for COPD through the [facility].  He did the program previously in 2014.  He is looking forward to attending.  He really wants to work on his breathing and his mobility.  He is also hoping to be able to work on getting back to singing again as he misses singing in the church choir.  He lives at home with his wife who also has significant health issues.  He worries about her health as she has stage III kidney disease that went undiagnosised for 3 years.  He also has a granddaughter that stays with them on occassion and likes to push her limits.  She is 13 and he says shes going on 34!  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 He does see a councelor monthly through the [facility] and is on bipropion for this. He says it helps some but not completely.  His wife usually drives him to his appointments but also goes with their daughter and her son to appointments.  So appointment schedules could cause some attendance conflicts, but other wise he does not see any issues getting to rehab as long as calendar is free.  He has two dogs, a rotwiller and boxer, that he enjoys walking (or being dragged by) and spending time with them.  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    Expected Outcomes short: Attend rehab to build stamina LOng: Work on improving his breathing    Continue Psychosocial Services  Follow up required by staff          Psychosocial Re-Evaluation:   Psychosocial Discharge (Final Psychosocial Re-Evaluation):    Education: Education Goals: Education classes will be provided on a weekly basis, covering required topics. Participant will state understanding/return demonstration of topics presented.  Learning Barriers/Preferences:  Learning Barriers/Preferences - 11/16/23 1454        Learning Barriers/Preferences   Learning Barriers None    Learning Preferences None          Education Topics: How Lungs Work and Diseases: - Discuss the anatomy of the lungs and diseases that can affect the lungs, such as COPD.   Exercise: -Discuss the importance of exercise, FITT principles of exercise, normal and abnormal responses to exercise, and how to exercise safely.   Environmental Irritants: -Discuss types of environmental irritants and how to limit exposure to environmental irritants.   Meds/Inhalers and oxygen : - Discuss respiratory medications, definition of an inhaler and oxygen , and the proper way to  use an inhaler and oxygen .   Energy Saving Techniques: - Discuss methods to conserve energy and decrease shortness of breath when performing activities of daily living.    Bronchial Hygiene / Breathing Techniques: - Discuss breathing mechanics, pursed-lip breathing technique,  proper posture, effective ways to clear airways, and other functional breathing techniques   Cleaning Equipment: - Provides group verbal and written instruction about the health risks of elevated stress, cause of high stress, and healthy ways to reduce stress.   Nutrition I: Fats: - Discuss the types of cholesterol, what cholesterol does to the body, and how cholesterol levels can be controlled.   Nutrition II: Labels: -Discuss the different components of food labels and how to read food labels.   Respiratory Infections: - Discuss the signs and symptoms of respiratory infections, ways to prevent respiratory infections, and the importance of seeking medical treatment when having a respiratory infection.   Stress I: Signs and Symptoms: - Discuss the causes of stress, how stress may lead to anxiety and depression, and ways to limit stress.   Stress II: Relaxation: -Discuss relaxation techniques to limit stress.   Oxygen  for Home/Travel: - Discuss how to prepare for travel  when on oxygen  and proper ways to transport and store oxygen  to ensure safety.   Knowledge Questionnaire Score:  Knowledge Questionnaire Score - 11/16/23 1454       Knowledge Questionnaire Score   Pre Score 15/18          Core Components/Risk Factors/Patient Goals at Admission:  Personal Goals and Risk Factors at Admission - 11/16/23 1454       Core Components/Risk Factors/Patient Goals on Admission    Weight Management Yes;Weight Maintenance    Intervention Weight Management: Develop a combined nutrition and exercise program designed to reach desired caloric intake, while maintaining appropriate intake of nutrient and fiber, sodium and fats, and appropriate energy expenditure required for the weight goal.;Weight Management: Provide education and appropriate resources to help participant work on and attain dietary goals.    Admit Weight 152 lb 8 oz (69.2 kg)    Goal Weight: Short Term 150 lb (68 kg)    Goal Weight: Long Term 150 lb (68 kg)    Expected Outcomes Short Term: Continue to assess and modify interventions until short term weight is achieved;Long Term: Adherence to nutrition and physical activity/exercise program aimed toward attainment of established weight goal;Weight Maintenance: Understanding of the daily nutrition guidelines, which includes 25-35% calories from fat, 7% or less cal from saturated fats, less than 200mg  cholesterol, less than 1.5gm of sodium, & 5 or more servings of fruits and vegetables daily    Improve shortness of breath with ADL's Yes    Intervention Provide education, individualized exercise plan and daily activity instruction to help decrease symptoms of SOB with activities of daily living.    Expected Outcomes Short Term: Improve cardiorespiratory fitness to achieve a reduction of symptoms when performing ADLs;Long Term: Be able to perform more ADLs without symptoms or delay the onset of symptoms    Increase knowledge of respiratory medications and  ability to use respiratory devices properly  Yes    Intervention Provide education and demonstration as needed of appropriate use of medications, inhalers, and oxygen  therapy.    Expected Outcomes Short Term: Achieves understanding of medications use. Understands that oxygen  is a medication prescribed by physician. Demonstrates appropriate use of inhaler and oxygen  therapy.;Long Term: Maintain appropriate use of medications, inhalers, and oxygen  therapy.    Diabetes --  Hx of DM but no meds currently   Hypertension Yes    Intervention Provide education on lifestyle modifcations including regular physical activity/exercise, weight management, moderate sodium restriction and increased consumption of fresh fruit, vegetables, and low fat dairy, alcohol moderation, and smoking cessation.;Monitor prescription use compliance.    Expected Outcomes Long Term: Maintenance of blood pressure at goal levels.;Short Term: Continued assessment and intervention until BP is < 140/72mm HG in hypertensive participants. < 130/49mm HG in hypertensive participants with diabetes, heart failure or chronic kidney disease.    Lipids Yes    Intervention Provide education and support for participant on nutrition & aerobic/resistive exercise along with prescribed medications to achieve LDL 70mg , HDL >40mg .    Expected Outcomes Short Term: Participant states understanding of desired cholesterol values and is compliant with medications prescribed. Participant is following exercise prescription and nutrition guidelines.;Long Term: Cholesterol controlled with medications as prescribed, with individualized exercise RX and with personalized nutrition plan. Value goals: LDL < 70mg , HDL > 40 mg.          Core Components/Risk Factors/Patient Goals Review:    Core Components/Risk Factors/Patient Goals at Discharge (Final Review):    ITP Comments:  ITP Comments     Row Name 11/16/23 1433 11/18/23 1255 11/30/23 1511 12/28/23 0829  12/28/23 1248   ITP Comments Patient attend orientation today.  Patient is attending Pulmonary Rehabilitation Program.  Documentation for diagnosis can be found in scanned into media from [facility].  Reviewed medical chart, RPE/RPD, gym safety, and program guidelines.  Patient was fitted to equipment they will be using during rehab.  Patient is scheduled to start exercise on Friday 11/18/23 at 1pm.   Initial ITP created and sent for review and signature by Dr. Anton Kelp, Medical Director for Pulmonary Rehabilitation Program. First full day of exercise!  Patient was oriented to gym and equipment including functions, settings, policies, and procedures.  Patient's individual exercise prescription and treatment plan were reviewed.  All starting workloads were established based on the results of the 6 minute walk test done at initial orientation visit.  The plan for exercise progression was also introduced and progression will be customized based on patient's performance and goals. 30 day review completed. ITP sent to Dr.Jehanzeb Memon, Medical Director of  Pulmonary Rehab. Continue with ITP unless changes are made by physician.  Newer to prorgam 30 day review completed. ITP sent to Dr.Jehanzeb Memon, Medical Director of  Pulmonary Rehab. Continue with ITP unless changes are made by physician.  Newer to the program. Patient says his wife has been diagnosed with cancer and he will not be back for a while. He could not say how long that was. Request that he call us  in December to let us  know when he plans to return.    Row Name 01/24/24 1019           ITP Comments 30 day review completed. ITP sent to Dr.Jehanzeb Memon, Medical Director of  Pulmonary Rehab. Continue with ITP unless changes are made by physician.    Currently out on medical hold          Comments: 30 day review

## 2024-01-25 ENCOUNTER — Encounter (HOSPITAL_COMMUNITY)

## 2024-01-27 ENCOUNTER — Telehealth (HOSPITAL_COMMUNITY): Payer: Self-pay

## 2024-01-27 ENCOUNTER — Encounter (HOSPITAL_COMMUNITY)

## 2024-01-30 ENCOUNTER — Encounter (HOSPITAL_COMMUNITY)

## 2024-02-01 ENCOUNTER — Encounter (HOSPITAL_COMMUNITY)

## 2024-02-03 ENCOUNTER — Encounter (HOSPITAL_COMMUNITY)

## 2024-02-06 ENCOUNTER — Encounter (HOSPITAL_COMMUNITY)

## 2024-02-08 ENCOUNTER — Encounter (HOSPITAL_COMMUNITY)

## 2024-02-10 ENCOUNTER — Encounter (HOSPITAL_COMMUNITY)

## 2024-02-13 ENCOUNTER — Encounter (HOSPITAL_COMMUNITY)

## 2024-02-14 NOTE — Telephone Encounter (Signed)
"      °  11/28/2023 02:36 PM EDT by Zina Richerd DASEN, RN  Gwenetta Kirks, Graceson L (Self) -- Remove  Left Message - Missed rehab today   "

## 2024-02-15 ENCOUNTER — Encounter (HOSPITAL_COMMUNITY)

## 2024-02-17 ENCOUNTER — Encounter (HOSPITAL_COMMUNITY)

## 2024-02-20 ENCOUNTER — Encounter (HOSPITAL_COMMUNITY)

## 2024-02-21 ENCOUNTER — Encounter (HOSPITAL_COMMUNITY): Payer: Self-pay | Admitting: *Deleted

## 2024-02-21 DIAGNOSIS — J449 Chronic obstructive pulmonary disease, unspecified: Secondary | ICD-10-CM

## 2024-02-21 NOTE — Progress Notes (Signed)
 Pulmonary Individual Treatment Plan  Patient Details  Name: Mark Schroeder MRN: [id number] Date of Birth: [birth year] Referring Provider:   Flowsheet Row PULMONARY REHAB COPD ORIENTATION from 11/16/2023 in [facility] CARDIAC REHABILITATION  Referring Provider Candyce Caraway MD  [[facility]]    Initial Encounter Date:  Flowsheet Row PULMONARY REHAB COPD ORIENTATION from 11/16/2023 in [facility]ARDI[facility] REHABILITATION  Date 11/16/23    Visit Diagnosis: Chronic obstructive pulmonary disease, unspecified COPD type (HCC)  Patient's Home Medications on Admission:  Current Medications[1]  Past Medical History: Past Medical History:  Diagnosis Date   COPD (chronic obstructive pulmonary disease) (HCC)    Depression    Diabetic peripheral neuropathy (HCC)    Hyperlipidemia    Hypertension    PTSD (post-traumatic stress disorder)    Type 2 diabetes mellitus (HCC)     Tobacco Use: Tobacco Use History[2]  Labs: Review Flowsheet  More data may exist      Latest Ref Rng & Units 08/15/2014 04/14/2019 08/15/2019 03/05/2020 04/17/2023  Labs for ITP Cardiac and Pulmonary Rehab  Trlycerides <150 mg/dL - - 877  812  -  Hemoglobin A1c 4.8 - 5.6 % 6.7  6.8  6.9  6.7  6.3   PH, Arterial 7.350 - 7.450 - - - 7.362  -  PCO2 arterial 32.0 - 48.0 mmHg - - - 58.9  -  Bicarbonate 20.0 - 28.0 mmol/L - - - 30.0  -  O2 Saturation % - - - 96.2  -    Capillary Blood Glucose: Lab Results  Component Value Date   GLUCAP 185 (H) 04/18/2023   GLUCAP 218 (H) 04/17/2023   GLUCAP 133 (H) 04/17/2023   GLUCAP 117 (H) 04/17/2023   GLUCAP 83 04/17/2023     Pulmonary Assessment Scores:  Pulmonary Assessment Scores     Row Name 11/16/23 1456         ADL UCSD   ADL Phase Entry     SOB Score total 94     Rest 2     Walk 3     Stairs 5     Bath 4     Dress 4     Shop 4       CAT Score   CAT Score 18       mMRC Score   mMRC Score 4       UCSD: Self-administered rating of dyspnea associated with  activities of daily living (ADLs) 6-point scale (0 = not at all to 5 = maximal or unable to do because of breathlessness)  Scoring Scores range from 0 to 120.  Minimally important difference is 5 units  CAT: CAT can identify the health impairment of COPD patients and is better correlated with disease progression.  CAT has a scoring range of zero to 40. The CAT score is classified into four groups of low (less than 10), medium (10 - 20), high (21-30) and very high (31-40) based on the impact level of disease on health status. A CAT score over 10 suggests significant symptoms.  A worsening CAT score could be explained by an exacerbation, poor medication adherence, poor inhaler technique, or progression of COPD or comorbid conditions.  CAT MCID is 2 points  mMRC: mMRC (Modified Medical Research Council) Dyspnea Scale is used to assess the degree of baseline functional disability in patients of respiratory disease due to dyspnea. No minimal important difference is established. A decrease in score of 1 point or greater is considered a positive change.  Pulmonary Function Assessment:  Pulmonary Function Assessment - 11/16/23 1455       Pulmonary Function Tests   FVC% 70 %   08/06/15   FEV1% 40 %    FEV1/FVC Ratio 45    RV% 124 %    DLCO% 55 %      Breath   Bilateral Breath Sounds Decreased;Wheezes;Inspiratory;Expiratory    Shortness of Breath Yes;Fear of Shortness of Breath;Limiting activity;Panic with Shortness of Breath          Exercise Target Goals: Exercise Program Goal: Individual exercise prescription set using results from initial 6 min walk test and THRR while considering  patients activity barriers and safety.   Exercise Prescription Goal: Initial exercise prescription builds to 30-45 minutes a day of aerobic activity, 2-3 days per week.  Home exercise guidelines will be given to patient during program as part of exercise prescription that the participant will  acknowledge.  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 HR 102 bpm     Max Ex. BP 136/64     2 Minute Post BP 128/62       Interval HR   1 Minute HR 101     2 Minute HR 92     3 Minute HR 102     4 Minute HR 99     5 Minute HR 98     6 Minute HR 102     2 Minute Post HR 77     Interval Heart Rate? Yes       Interval Oxygen    Interval Oxygen ? Yes     Baseline Oxygen  Saturation % 97 %     1 Minute Oxygen  Saturation % 93 %     1 Minute Liters of Oxygen  0 L  Room Air     2 Minute Oxygen  Saturation % 93 %     2 Minute Liters of Oxygen  0 L     3 Minute Oxygen  Saturation % 91 %     3 Minute Liters of Oxygen  0 L     4 Minute Oxygen  Saturation % 89 %     4 Minute Liters of Oxygen  0 L     5 Minute Oxygen  Saturation % 89 %  5 Minute Liters of Oxygen  0 L     6 Minute Oxygen  Saturation % 87 %     6 Minute Liters of Oxygen  0 L     2 Minute Post Oxygen  Saturation % 96 %     2 Minute Post Liters of Oxygen  0 L        Oxygen  Initial Assessment:  Oxygen  Initial Assessment - 11/16/23 1455        Home Oxygen    Home Oxygen  Device None    Sleep Oxygen  Prescription None    Home Exercise Oxygen  Prescription None    Home Resting Oxygen  Prescription None      Initial 6 min Walk   Oxygen  Used None      Program Oxygen  Prescription   Program Oxygen  Prescription None      Intervention   Short Term Goals To learn and demonstrate proper use of respiratory medications;To learn and understand importance of maintaining oxygen  saturations>88%;To learn and demonstrate proper pursed lip breathing techniques or other breathing techniques. ;To learn and understand importance of monitoring SPO2 with pulse oximeter and demonstrate accurate use of the pulse oximeter.    Long  Term Goals Maintenance of O2 saturations>88%;Compliance with respiratory medication;Exhibits proper breathing techniques, such as pursed lip breathing or other method taught during program session;Verbalizes importance of monitoring SPO2 with pulse oximeter and return demonstration;Demonstrates proper use of MDIs          Oxygen  Re-Evaluation:  Oxygen  Re-Evaluation     Row Name 11/18/23 1256             Goals/Expected Outcomes   Short Term Goals To learn and demonstrate proper use of respiratory medications;To learn and understand importance of maintaining oxygen  saturations>88%;To learn and demonstrate proper pursed lip breathing techniques or other breathing techniques. ;To learn and understand importance of monitoring SPO2 with pulse oximeter and demonstrate accurate use of the pulse oximeter.       Long  Term Goals Maintenance of O2 saturations>88%;Compliance with respiratory medication;Exhibits proper breathing techniques, such as pursed lip breathing or other method taught during program session;Verbalizes importance of monitoring SPO2 with pulse oximeter and return demonstration;Demonstrates proper use of MDIs       Comments Reviewed PLB technique with pt.  Talked about how it works and it's importance in  maintaining their exercise saturations.       Goals/Expected Outcomes Short: Become more profiecient at using PLB.   Long: Become independent at using PLB.          Oxygen  Discharge (Final Oxygen  Re-Evaluation):  Oxygen  Re-Evaluation - 11/18/23 1256       Goals/Expected Outcomes   Short Term Goals To learn and demonstrate proper use of respiratory medications;To learn and understand importance of maintaining oxygen  saturations>88%;To learn and demonstrate proper pursed lip breathing techniques or other breathing techniques. ;To learn and understand importance of monitoring SPO2 with pulse oximeter and demonstrate accurate use of the pulse oximeter.    Long  Term Goals Maintenance of O2 saturations>88%;Compliance with respiratory medication;Exhibits proper breathing techniques, such as pursed lip breathing or other method taught during program session;Verbalizes importance of monitoring SPO2 with pulse oximeter and return demonstration;Demonstrates proper use of MDIs    Comments Reviewed PLB technique with pt.  Talked about how it works and it's importance in maintaining their exercise saturations.    Goals/Expected Outcomes Short: Become more profiecient at using PLB.   Long: Become independent at using PLB.          Initial  Exercise Prescription:  Initial Exercise Prescription - 11/16/23 1400       Date of Initial Exercise RX and Referring Provider   Date 11/16/23    Referring Provider Candyce Caraway MD   [facility]     Oxygen    Maintain Oxygen  Saturation 88% or higher      Treadmill   MPH 1.2    Grade 0.5    Minutes 15    METs 2      NuStep   Level 1    SPM 80    Minutes 15    METs 2      Prescription Details   Frequency (times per week) 3    Duration Progress to 30 minutes of continuous aerobic without signs/symptoms of physical distress      Intensity   THRR 40-80% of Max Heartrate 97-128    Ratings of Perceived Exertion 11-13    Perceived Dyspnea 0-4      Progression    Progression Continue to progress workloads to maintain intensity without signs/symptoms of physical distress.      Resistance Training   Training Prescription Yes    Weight 4 lb    Reps 10-15          Perform Capillary Blood Glucose checks as needed.  Exercise Prescription Changes:   Exercise Prescription Changes     Row Name 11/16/23 1400 12/12/23 1500           Response to Exercise   Blood Pressure (Admit) 126/74 108/60      Blood Pressure (Exercise) 136/64 --      Blood Pressure (Exit) 112/70 102/60      Heart Rate (Admit) 65 bpm 65 bpm      Heart Rate (Exercise) 102 bpm 77 bpm      Heart Rate (Exit) 71 bpm 77 bpm      Oxygen  Saturation (Admit) 97 % 94 %      Oxygen  Saturation (Exercise) 87 % 95 %      Oxygen  Saturation (Exit) 96 % 96 %      Rating of Perceived Exertion (Exercise) 14 11      Perceived Dyspnea (Exercise) 2 1      Symptoms SOB, leg pain 4/10 (calf) --      Comments walk test results --      Duration -- Continue with 30 min of aerobic exercise without signs/symptoms of physical distress.      Intensity -- THRR unchanged        Progression   Progression -- Continue to progress workloads to maintain intensity without signs/symptoms of physical distress.        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  Patient was oriented to gym and equipment including functions, settings, policies, and procedures.  Patient's individual exercise prescription and treatment plan were reviewed.  All starting workloads were established based on the results of  the 6 minute walk test done at initial orientation visit.  The plan for exercise progression was also introduced and progression will be customized based on patient's performance and goals.         Exercise Goals and Review:   Exercise Goals     Row Name 11/16/23 1300             Exercise Goals   Increase Physical Activity Yes       Intervention Provide advice, education, support and counseling about physical activity/exercise needs.;Develop an individualized exercise prescription for aerobic and resistive training based on initial evaluation findings, risk stratification, comorbidities and participant's personal goals.       Expected Outcomes Short Term: Attend rehab on a regular basis to increase amount of physical activity.;Long Term: Exercising regularly at least 3-5 days a week.;Long Term: Add in home exercise to make exercise part of routine and to increase amount of physical activity.       Increase Strength and Stamina Yes       Intervention Provide advice, education, support and counseling about physical activity/exercise needs.;Develop an individualized exercise prescription for aerobic and resistive training based on initial evaluation findings, risk stratification, comorbidities and participant's personal goals.       Expected Outcomes Short Term: Increase workloads from initial exercise prescription for resistance, speed, and METs.;Short Term: Perform resistance training exercises routinely during rehab and add in resistance training at home;Long Term: Improve cardiorespiratory fitness, muscular endurance and strength as measured by increased METs and functional capacity ( )       Able to understand and use rate of perceived exertion (RPE) scale Yes       Intervention Provide education and explanation on how to use RPE scale       Expected Outcomes Short Term: Able to use RPE daily in rehab to express subjective intensity level;Long Term:  Able to use RPE to guide intensity  level when exercising independently       Able to understand and use Dyspnea scale Yes       Intervention Provide education and explanation on how to use Dyspnea scale       Expected Outcomes Short Term: Able to use Dyspnea scale daily in rehab to express subjective sense of shortness of breath during exertion;Long Term: Able to use Dyspnea scale to guide intensity level when exercising independently       Knowledge and understanding of Target Heart Rate Range (THRR) Yes       Intervention Provide education and explanation of THRR including how the numbers were predicted and where they are located for reference       Expected Outcomes Long Term: Able to use THRR to govern intensity when exercising independently;Short Term: Able to use daily as guideline for intensity in rehab;Short Term: Able to state/look up THRR       Able to check pulse independently Yes       Intervention Provide education and demonstration on how to check pulse in carotid and radial arteries.;Review the importance of being able to check your own pulse for safety during independent exercise       Expected Outcomes Short Term: Able to explain why pulse checking is important during independent exercise;Long Term: Able to check pulse independently and accurately  Understanding of Exercise Prescription Yes       Intervention Provide education, explanation, and written materials on patient's individual exercise prescription       Expected Outcomes Short Term: Able to explain program exercise prescription;Long Term: Able to explain home exercise prescription to exercise independently          Exercise Goals Re-Evaluation :  Exercise Goals Re-Evaluation     Row Name 11/18/23 1257             Exercise Goal Re-Evaluation   Comments Reviewed RPE and dyspnea scale, THR and program prescription with pt today.  Pt voiced understanding and was given a copy of goals to take home.       Expected Outcomes Short: Use RPE daily to  regulate intensity.  Long: Follow program prescription in THR.          Discharge Exercise Prescription (Final Exercise Prescription Changes):  Exercise Prescription Changes - 12/12/23 1500       Response to Exercise   Blood Pressure (Admit) 108/60    Blood Pressure (Exit) 102/60    Heart Rate (Admit) 65 bpm    Heart Rate (Exercise) 77 bpm    Heart Rate (Exit) 77 bpm    Oxygen  Saturation (Admit) 94 %    Oxygen  Saturation (Exercise) 95 %    Oxygen  Saturation (Exit) 96 %    Rating of Perceived Exertion (Exercise) 11    Perceived Dyspnea (Exercise) 1    Duration Continue with 30 min of aerobic exercise without signs/symptoms of physical distress.    Intensity THRR unchanged      Progression   Progression Continue to progress workloads to maintain intensity without signs/symptoms of physical distress.      Resistance Training   Training Prescription Yes    Weight 4    Reps 10-15      NuStep   Level 1    SPM 66    Minutes 15    METs 1.5      Arm Ergometer   Level 2    Watts 22    Minutes 15    METs 1.5          Nutrition:  Target Goals: Understanding of nutrition guidelines, daily intake of sodium 1500mg , cholesterol 200mg , calories 30% from fat and 7% or less from saturated fats, daily to have 5 or more servings of fruits and vegetables.  Biometrics:  Pre Biometrics - 11/16/23 1438       Pre Biometrics   Height 5' 2 (1.575 m)    Weight 152 lb 8 oz (69.2 kg)    Waist Circumference 35 inches    Hip Circumference 37.5 inches    Waist to Hip Ratio 0.93 %    BMI (Calculated) 27.89    Grip Strength 16.4 kg    Flexibility 0 in    Single Leg Stand 7.3 seconds           Nutrition Therapy Plan and Nutrition Goals:  Nutrition Therapy & Goals - 11/16/23 1453       Intervention Plan   Intervention Prescribe, educate and counsel regarding individualized specific dietary modifications aiming towards targeted core components such as weight, hypertension, lipid  management, diabetes, heart failure and other comorbidities.;Nutrition handout(s) given to patient.    Expected Outcomes Short Term Goal: Understand basic principles of dietary content, such as calories, fat, sodium, cholesterol and nutrients.;Long Term Goal: Adherence to prescribed nutrition plan.          Nutrition  Assessments:  MEDIFICTS Score Key: >=70 Need to make dietary changes  40-70 Heart Healthy Diet <= 40 Therapeutic Level Cholesterol Diet  Flowsheet Row PULMONARY REHAB COPD ORIENTATION from 11/16/2023 in [facility] CARDIAC REHABILITATION  Picture Your Plate Total Score on Admission 46   Picture Your Plate Scores: <59 Unhealthy dietary pattern with much room for improvement. 41-50 Dietary pattern unlikely to meet recommendations for good health and room for improvement. 51-60 More healthful dietary pattern, with some room for improvement.  >60 Healthy dietary pattern, although there may be some specific behaviors that could be improved.    Nutrition Goals Re-Evaluation:   Nutrition Goals Discharge (Final Nutrition Goals Re-Evaluation):   Psychosocial: Target Goals: Acknowledge presence or absence of significant depression and/or stress, maximize coping skills, provide positive support system. Participant is able to verbalize types and ability to use techniques and skills needed for reducing stress and depression.  Initial Review & Psychosocial Screening:  Initial Psych Review & Screening - 11/16/23 1303       Initial Review   Current issues with History of Depression;Current Depression;Current Stress Concerns;Current Psychotropic Meds;Current Sleep Concerns    Source of Stress Concerns Family;Chronic Illness;Unable to participate in former interests or hobbies;Unable to perform yard/household activities    Comments granddaughter 10 that likes to push his buttons, worries about his wife's health as it declining (stage III kidney disease and SOB), [facility] on bup[facility]ion helps some      Family Dynamics   Good Support System? Yes   wife, daughter   Comments wife drives him back and forth      Barriers   Psychosocial barriers to participate in program Psychosocial barriers identified (see note);The patient should benefit from training in stress management and relaxation.      Screening Interventions   Interventions Encouraged to exercise;To provide support and resources with identified psychosocial needs;Provide feedback about the scores to participant    Expected Outcomes Short Term goal: Utilizing psychosocial counselor, staff and physician to assist with identification of specific Stressors or current issues interfering with healing process. Setting desired goal for each stressor or current issue identified.;Long Term Goal: Stressors or current issues are controlled or eliminated.;Short Term goal: Identification and review with participant of any Quality of Life or Depression concerns found by scoring the questionnaire.;Long Term goal: The participant improves quality of Life and PHQ9 Scores as seen by post scores and/or verbalization of changes          Quality of Life Scores:  Scores of 19 and below usually indicate a poorer quality of life in these areas.  A difference of  2-3 points is a clinically meaningful difference.  A difference of 2-3 points in the total score of the Quality of Life Index has been associated with significant improvement in overall quality of life, self-image, physical symptoms, and general health in studies assessing change in quality of life.   PHQ-9: Review Flowsheet       11/16/2023 12/07/2012 11/21/2012  Depression screen PHQ 2/9  Decreased Interest 3 0 0  Down, Depressed, Hopeless 2 0 1  PHQ - 2 Score 5 0 1  Altered sleeping 3 - -  Tired, decreased energy 2 - -  Change in appetite 3 - -  Feeling bad or failure about yourself  2 - -  Trouble concentrating 3 - -  Moving slowly or  fidgety/restless 3 - -  Suicidal thoughts 0 - -  PHQ-9 Score 21  - -  Difficult doing work/chores Very difficult - -    Details       Data saved with a previous flowsheet row definition        Interpretation of Total Score  Total Score Depression Severity:  1-4 = Minimal depression, 5-9 = Mild depression, 10-14 = Moderate depression, 15-19 = Moderately severe depression, 20-27 = Severe depression   Psychosocial Evaluation and Intervention:  Psychosocial Evaluation - 11/16/23 1438       Psychosocial Evaluation & Interventions   Interventions Stress management education;Relaxation education;Encouraged to exercise with the program and follow exercise prescription    Comments Winthrop is coming into pulmonary rehab for COPD through the [facility].  He did the program previously in 2014.  He is looking forward to attending.  He really wants to work on his breathing and his mobility.  He is also hoping to be able to work on getting back to singing again as he misses singing in the church choir.  He lives at home with his wife who also has significant health issues.  He worries about her health as she has stage III kidney disease that went undiagnosised for 3 years.  He also has a granddaughter that stays with them on occassion and likes to push her limits.  She is 13 and he says shes going on 34!  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 He does see a councelor monthly through the [facility] and is on bipropion for this. He says it helps some but not completely.  His wife usually drives him to his appointments but also goes with their daughter and her son to appointments.  So appointment schedules could cause some attendance conflicts, but other wise he does not see any issues getting to rehab as long as calendar is free.  He has two dogs, a rotwiller and boxer, that he enjoys walking (or being dragged by) and spending time with them.  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    Expected Outcomes short: Attend rehab to build stamina LOng: Work on improving his breathing    Continue Psychosocial Services  Follow up required by staff          Psychosocial Re-Evaluation:   Psychosocial Discharge (Final Psychosocial Re-Evaluation):    Education: Education Goals: Education classes will be provided on a weekly basis, covering required topics. Participant will state understanding/return demonstration of topics presented.  Learning Barriers/Preferences:  Learning Barriers/Preferences - 11/16/23 1454       Learning Barriers/Preferences   Learning Barriers None    Learning Preferences None          Education Topics: How Lungs Work and Diseases: - Discuss the anatomy of the lungs and diseases that can affect the lungs, such as COPD.   Exercise: -Discuss the importance of exercise, FITT principles of exercise, normal and abnormal responses to exercise, and how to exercise safely.   Environmental Irritants: -Discuss types of environmental irritants and how to limit exposure to environmental irritants.   Meds/Inhalers and oxygen : - Discuss respiratory medications, definition of an inhaler and oxygen , and the proper way to use an inhaler and oxygen .   Energy Saving Techniques: - Discuss methods to conserve energy and decrease shortness of breath when performing activities of daily living.    Bronchial Hygiene / Breathing Techniques: - Discuss breathing mechanics, pursed-lip breathing technique,  proper posture, effective ways to clear airways, and other functional  breathing techniques   Cleaning Equipment: - Provides group verbal and written instruction about the health risks of elevated stress, cause of high stress, and healthy ways to reduce stress.   Nutrition I: Fats: - Discuss the types of cholesterol, what cholesterol does to the body, and how cholesterol levels can be controlled.   Nutrition II:  Labels: -Discuss the different components of food labels and how to read food labels.   Respiratory Infections: - Discuss the signs and symptoms of respiratory infections, ways to prevent respiratory infections, and the importance of seeking medical treatment when having a respiratory infection.   Stress I: Signs and Symptoms: - Discuss the causes of stress, how stress may lead to anxiety and depression, and ways to limit stress.   Stress II: Relaxation: -Discuss relaxation techniques to limit stress.   Oxygen  for Home/Travel: - Discuss how to prepare for travel when on oxygen  and proper ways to transport and store oxygen  to ensure safety.   Knowledge Questionnaire Score:  Knowledge Questionnaire Score - 11/16/23 1454       Knowledge Questionnaire Score   Pre Score 15/18          Core Components/Risk Factors/Patient Goals at Admission:  Personal Goals and Risk Factors at Admission - 11/16/23 1454       Core Components/Risk Factors/Patient Goals on Admission    Weight Management Yes;Weight Maintenance    Intervention Weight Management: Develop a combined nutrition and exercise program designed to reach desired caloric intake, while maintaining appropriate intake of nutrient and fiber, sodium and fats, and appropriate energy expenditure required for the weight goal.;Weight Management: Provide education and appropriate resources to help participant work on and attain dietary goals.    Admit Weight 152 lb 8 oz (69.2 kg)    Goal Weight: Short Term 150 lb (68 kg)    Goal Weight: Long Term 150 lb (68 kg)    Expected Outcomes Short Term: Continue to assess and modify interventions until short term weight is achieved;Long Term: Adherence to nutrition and physical activity/exercise program aimed toward attainment of established weight goal;Weight Maintenance: Understanding of the daily nutrition guidelines, which includes 25-35% calories from fat, 7% or less cal from saturated fats,  less than 200mg  cholesterol, less than 1.5gm of sodium, & 5 or more servings of fruits and vegetables daily    Improve shortness of breath with ADL's Yes    Intervention Provide education, individualized exercise plan and daily activity instruction to help decrease symptoms of SOB with activities of daily living.    Expected Outcomes Short Term: Improve cardiorespiratory fitness to achieve a reduction of symptoms when performing ADLs;Long Term: Be able to perform more ADLs without symptoms or delay the onset of symptoms    Increase knowledge of respiratory medications and ability to use respiratory devices properly  Yes    Intervention Provide education and demonstration as needed of appropriate use of medications, inhalers, and oxygen  therapy.    Expected Outcomes Short Term: Achieves understanding of medications use. Understands that oxygen  is a medication prescribed by physician. Demonstrates appropriate use of inhaler and oxygen  therapy.;Long Term: Maintain appropriate use of medications, inhalers, and oxygen  therapy.    Diabetes --   Hx of DM but no meds currently   Hypertension Yes    Intervention Provide education on lifestyle modifcations including regular physical activity/exercise, weight management, moderate sodium restriction and increased consumption of fresh fruit, vegetables, and low fat dairy, alcohol moderation, and smoking cessation.;Monitor prescription use compliance.    Expected  Outcomes Long Term: Maintenance of blood pressure at goal levels.;Short Term: Continued assessment and intervention until BP is < 140/52mm HG in hypertensive participants. < 130/71mm HG in hypertensive participants with diabetes, heart failure or chronic kidney disease.    Lipids Yes    Intervention Provide education and support for participant on nutrition & aerobic/resistive exercise along with prescribed medications to achieve LDL 70mg , HDL >40mg .    Expected Outcomes Short Term: Participant states  understanding of desired cholesterol values and is compliant with medications prescribed. Participant is following exercise prescription and nutrition guidelines.;Long Term: Cholesterol controlled with medications as prescribed, with individualized exercise RX and with personalized nutrition plan. Value goals: LDL < 70mg , HDL > 40 mg.          Core Components/Risk Factors/Patient Goals Review:    Core Components/Risk Factors/Patient Goals at Discharge (Final Review):    ITP Comments:  ITP Comments     Row Name 11/16/23 1433 11/18/23 1255 11/30/23 1511 12/28/23 0829 12/28/23 1248   ITP Comments Patient attend orientation today.  Patient is attending Pulmonary Rehabilitation Program.  Documentation for diagnosis can be found in scanned into media from [place].  Reviewed medical chart, RPE/RPD, gym safety, and program guidelines.  Patient was fitted to equipment they will be using during rehab.  Patient is scheduled to start exercise on Friday 11/18/23 at 1pm.   Initial ITP created and sent for review and signature by Dr. Anton Kelp, Medical Director for Pulmonary Rehabilitation Program. First full day of exercise!  Patient was oriented to gym and equipment including functions, settings, policies, and procedures.  Patient's individual exercise prescription and treatment plan were reviewed.  All starting workloads were established based on the results of the 6 minute walk test done at initial orientation visit.  The plan for exercise progression was also introduced and progression will be customized based on patient's performance and goals. 30 day review completed. ITP sent to Dr.Jehanzeb Memon, Medical Director of  Pulmonary Rehab. Continue with ITP unless changes are made by physician.  Newer to prorgam 30 day review completed. ITP sent to Dr.Jehanzeb Memon, Medical Director of  Pulmonary Rehab. Continue with ITP unless changes are made by physician.  Newer to the program. Patient says his wife has been  diagnosed with cancer and he will not be back for a while. He could not say how long that was. Request that he call us  in December to let us  know when he plans to return.    Row Name 01/24/24 1019 02/21/24 1413         ITP Comments 30 day review completed. ITP sent to Dr.Jehanzeb Memon, Medical Director of  Pulmonary Rehab. Continue with ITP unless changes are made by physician.    Currently out on medical hold 30 day review completed. ITP sent to Dr.Jehanzeb Memon, Medical Director of  Pulmonary Rehab. Continue with ITP unless changes are made by physician.    Currently out on medical hold til January.         Comments: 30 day review      [1]  Current Outpatient Medications:    acetaminophen  (TYLENOL ) 325 MG tablet, Take 2 tablets (650 mg total) by mouth every 6 (six) hours as needed for mild pain (or Fever >/= 101)., Disp: 12 tablet, Rfl: 1   albuterol  (PROVENTIL ) (2.5 MG/3ML) 0.083% nebulizer solution, Take 3 mLs (2.5 mg total) by nebulization 3 (three) times daily as needed for wheezing or shortness of breath., Disp: 75 mL, Rfl: 12  amLODipine  (NORVASC ) 10 MG tablet, Take 5 mg by mouth daily., Disp: , Rfl:    aspirin  EC 81 MG tablet, Take 1 tablet (81 mg total) by mouth daily with breakfast., Disp: 30 tablet, Rfl: 2   atorvastatin  (LIPITOR) 40 MG tablet, Take 20 mg by mouth at bedtime., Disp: , Rfl:    azelastine (ASTELIN) 0.1 % nasal spray, Place 2 sprays into both nostrils 2 (two) times daily. Use in each nostril as directed, Disp: , Rfl:    azithromycin  (ZITHROMAX ) 250 MG tablet, Take 250 mg by mouth 3 (three) times a week. M,W,F, Disp: , Rfl:    bacitracin ointment, Apply 1 Application topically 2 (two) times daily as needed (skin abrasion)., Disp: , Rfl:    Budeson-Glycopyrrol-Formoterol  (BREZTRI AEROSPHERE) 160-9-4.8 MCG/ACT AERO, Inhale 2 puffs into the lungs in the morning and at bedtime., Disp: , Rfl:    budesonide -formoterol  (SYMBICORT ) 160-4.5 MCG/ACT inhaler, Inhale 2  puffs into the lungs 2 (two) times daily., Disp: 1 each, Rfl: 12   buPROPion  (ZYBAN ) 150 MG 12 hr tablet, Take 150 mg by mouth daily., Disp: , Rfl:    carboxymethylcellulose (REFRESH PLUS) 0.5 % SOLN, Place 1 drop into both eyes 4 (four) times daily as needed (for dry eyes)., Disp: , Rfl:    carvedilol  (COREG ) 25 MG tablet, Take 1 tablet (25 mg total) by mouth 2 (two) times daily with a meal., Disp: 60 tablet, Rfl: 1   cetirizine (ZYRTEC) 10 MG tablet, Take 10 mg by mouth at bedtime., Disp: , Rfl:    Cholecalciferol  (VITAMIN D  PO), Take 1,000 Units by mouth daily., Disp: , Rfl:    divalproex  (DEPAKOTE  ER) 500 MG 24 hr tablet, Take 500 mg by mouth daily., Disp: , Rfl:    Emollient (EUCERIN DAILY HYDRATION) CREA, Apply 1 Application topically in the morning and at bedtime., Disp: , Rfl:    famotidine  (PEPCID ) 40 MG tablet, Take 40 mg by mouth 2 (two) times daily., Disp: , Rfl:    finasteride (PROSCAR) 5 MG tablet, Take 5 mg by mouth daily., Disp: , Rfl:    gabapentin  (NEURONTIN ) 300 MG capsule, Take 300 mg by mouth at bedtime., Disp: , Rfl:    guaiFENesin  (ROBITUSSIN) 100 MG/5ML liquid, Take 10 mLs by mouth every 4 (four) hours as needed for cough or to loosen phlegm., Disp: , Rfl:    ipratropium (ATROVENT ) 0.06 % nasal spray, Place 2 sprays into both nostrils in the morning and at bedtime., Disp: , Rfl:    lactobacillus acidophilus (BACID) TABS tablet, Take 1 tablet by mouth daily., Disp: , Rfl:    loratadine  (CLARITIN ) 10 MG tablet, Take 1 tablet (10 mg total) by mouth daily., Disp: 30 tablet, Rfl: 0   Melatonin 5 MG TABS, Take 10 mg by mouth at bedtime., Disp: , Rfl:    pantoprazole  (PROTONIX ) 40 MG tablet, Take 40 mg by mouth daily., Disp: , Rfl:    predniSONE  (DELTASONE ) 50 MG tablet, Take 1 tablet (50 mg total) by mouth 2 (two) times daily with a meal., Disp: 14 tablet, Rfl: 0   tamsulosin  (FLOMAX ) 0.4 MG CAPS capsule, Take 0.8 mg by mouth daily after supper., Disp: , Rfl:  [2]  Social  History Tobacco Use  Smoking Status Former   Current packs/day: 0.25   Average packs/day: 0.3 packs/day for 35.0 years (8.8 ttl pk-yrs)   Types: Cigarettes  Smokeless Tobacco Never

## 2024-02-22 ENCOUNTER — Encounter (HOSPITAL_COMMUNITY)

## 2024-02-24 ENCOUNTER — Encounter (HOSPITAL_COMMUNITY)

## 2024-02-27 ENCOUNTER — Encounter (HOSPITAL_COMMUNITY)

## 2024-02-29 ENCOUNTER — Encounter (HOSPITAL_COMMUNITY): Payer: Self-pay

## 2024-02-29 ENCOUNTER — Telehealth (HOSPITAL_COMMUNITY): Payer: Self-pay

## 2024-02-29 ENCOUNTER — Encounter (HOSPITAL_COMMUNITY)

## 2024-02-29 DIAGNOSIS — J449 Chronic obstructive pulmonary disease, unspecified: Secondary | ICD-10-CM

## 2024-02-29 NOTE — Progress Notes (Signed)
 Pulmonary Individual Treatment Plan  Patient Details  Name: Mark Schroeder MRN: [id number] Date of Birth: Mar 25, 1945 Referring Provider:   Flowsheet Row PULMONARY REHAB COPD ORIENTATION from 11/16/2023 in [facility] CARDIAC REHABILITATION  Referring Provider Candyce Caraway MD  [[facility]]    Initial Encounter Date:  Flowsheet Row PULMONARY REHAB COPD ORIENTATION from 11/16/2023 in [facility]  Date 11/16/23    Visit Diagnosis: Chronic obstructive pulmonary disease, unspecified COPD type (HCC)  Patient's Home Medications on Admission: Current Medications[1]  Past Medical History: Past Medical History:  Diagnosis Date   COPD (chronic obstructive pulmonary disease) (HCC)    Depression    Diabetic peripheral neuropathy (HCC)    Hyperlipidemia    Hypertension    PTSD (post-traumatic stress disorder)    Type 2 diabetes mellitus (HCC)     Tobacco Use: Tobacco Use History[2]  Labs: Review Flowsheet  More data may exist      Latest Ref Rng & Units 08/15/2014 04/14/2019 08/15/2019 03/05/2020 04/17/2023  Labs for ITP Cardiac and Pulmonary Rehab  Trlycerides <150 mg/dL - - 877  812  -  Hemoglobin A1c 4.8 - 5.6 % 6.7  6.8  6.9  6.7  6.3   PH, Arterial 7.350 - 7.450 - - - 7.362  -  PCO2 arterial 32.0 - 48.0 mmHg - - - 58.9  -  Bicarbonate 20.0 - 28.0 mmol/L - - - 30.0  -  O2 Saturation % - - - 96.2  -     Pulmonary Assessment Scores:   UCSD: Self-administered rating of dyspnea associated with activities of daily living (ADLs) 6-point scale (0 = not at all to 5 = maximal or unable to do because of breathlessness)  Scoring Scores range from 0 to 120.  Minimally important difference is 5 units  CAT: CAT can identify the health impairment of COPD patients and is better correlated with disease progression.  CAT has a scoring range of zero to 40. The CAT score is classified into four groups of low (less than 10), medium (10 - 20), high (21-30) and very high  (31-40) based on the impact level of disease on health status. A CAT score over 10 suggests significant symptoms.  A worsening CAT score could be explained by an exacerbation, poor medication adherence, poor inhaler technique, or progression of COPD or comorbid conditions.  CAT MCID is 2 points  mMRC: mMRC (Modified Medical Research Council) Dyspnea Scale is used to assess the degree of baseline functional disability in patients of respiratory disease due to dyspnea. No minimal important difference is established. A decrease in score of 1 point or greater is considered a positive change.   Pulmonary Function Assessment:   Exercise Target Goals: Exercise Program Goal: Individual exercise prescription set using results from initial 6 min walk test and THRR while considering  patients activity barriers and safety.   Exercise Prescription Goal: Initial exercise prescription builds to 30-45 minutes a day of aerobic activity, 2-3 days per week.  Home exercise guidelines will be given to patient during program as part of exercise prescription that the participant will acknowledge.  Education: Aerobic Exercise: - Group verbal and visual presentation on the components of exercise prescription. Introduces F.I.T.T principle from ACSM for exercise prescriptions.  Reviews F.I.T.T. principles of aerobic exercise including progression. Written material provided at class time. Flowsheet Row PULMONARY REHAB CHRONIC OBSTRUCTIVE PULMONARY DISEASE from 12/15/2023 in [facility]  Education need identified 11/16/23    Education: Resistance Exercise: - Group  verbal and visual presentation on the components of exercise prescription. Introduces F.I.T.T principle from ACSM for exercise prescriptions  Reviews F.I.T.T. principles of resistance exercise including progression. Written material provided at class time.    Education: Exercise & Equipment Safety: - Individual verbal instruction and  demonstration of equipment use and safety with use of the equipment.   Education: Exercise Physiology & General Exercise Guidelines: - Group verbal and written instruction with models to review the exercise physiology of the cardiovascular system and associated critical values. Provides general exercise guidelines with specific guidelines to those with heart or lung disease.  Flowsheet Row PULMONARY REHAB CHRONIC OBSTRUCTIVE PULMONARY DISEASE from 12/15/2023 in [place] [facility]  Date 12/15/23  Educator [facility]  Instruction Review Code 1- Verbalizes Understanding    Education: Flexibility, Balance, Mind/Body Relaxation: - Group verbal and visual presentation with interactive activity on the components of exercise prescription. Introduces F.I.T.T principle from ACSM for exercise prescriptions. Reviews F.I.T.T. principles of flexibility and balance exercise training including progression. Also discusses the mind body connection.  Reviews various relaxation techniques to help reduce and manage stress (i.e. Deep breathing, progressive muscle relaxation, and visualization). Balance handout provided to take home. Written material provided at class time.   Activity Barriers & Risk Stratification:   6 Minute Walk:  Oxygen  Initial Assessment:   Oxygen  Re-Evaluation:   Oxygen  Discharge (Final Oxygen  Re-Evaluation):   Initial Exercise Prescription:   Perform Capillary Blood Glucose checks as needed.  Exercise Prescription Changes:   Exercise Comments:   Exercise Goals and Review:   Exercise Goals Re-Evaluation :   Discharge Exercise Prescription (Final Exercise Prescription Changes):   Nutrition:  Target Goals: Understanding of nutrition guidelines, daily intake of sodium 1500mg , cholesterol 200mg , calories 30% from fat and 7% or less from saturated fats, daily to have 5 or more servings of fruits and vegetables.  Education: Nutrition 1 -Group instruction  provided by verbal, written material, interactive activities, discussions, models, and posters to present general guidelines for heart healthy nutrition including macronutrients, label reading, and promoting whole foods over processed counterparts. Education serves as pensions consultant of discussion of heart healthy eating for all. Written material provided at class time.     Education: Nutrition 2 -Group instruction provided by verbal, written material, interactive activities, discussions, models, and posters to present general guidelines for heart healthy nutrition including sodium, cholesterol, and saturated fat. Providing guidance of habit forming to improve blood pressure, cholesterol, and body weight. Written material provided at class time.     Biometrics:    Nutrition Therapy Plan and Nutrition Goals:   Nutrition Assessments:  MEDIFICTS Score Key: >=70 Need to make dietary changes  40-70 Heart Healthy Diet <= 40 Therapeutic Level Cholesterol Diet  Flowsheet Row PULMONARY REHAB COPD ORIENTATION from 11/16/2023 in [facility] CARDIAC REHABILITATION  Picture Your Plate Total Score on Admission 46   Picture Your Plate Scores: <59 Unhealthy dietary pattern with much room for improvement. 41-50 Dietary pattern unlikely to meet recommendations for good health and room for improvement. 51-60 More healthful dietary pattern, with some room for improvement.  >60 Healthy dietary pattern, although there may be some specific behaviors that could be improved.   Nutrition Goals Re-Evaluation:   Nutrition Goals Discharge (Final Nutrition Goals Re-Evaluation):   Psychosocial: Target Goals: Acknowledge presence or absence of significant depression and/or stress, maximize coping skills, provide positive support system. Participant is able to verbalize types and ability to use techniques and skills needed for reducing stress and depression.  Education: Stress, Anxiety, and Depression - Group  verbal and visual presentation to define topics covered.  Reviews how body is impacted by stress, anxiety, and depression.  Also discusses healthy ways to reduce stress and to treat/manage anxiety and depression.  Written material provided at class time.   Education: Sleep Hygiene -Provides group verbal and written instruction about how sleep can affect your health.  Define sleep hygiene, discuss sleep cycles and impact of sleep habits. Review good sleep hygiene tips.    Initial Review & Psychosocial Screening:   Quality of Life Scores:  Scores of 19 and below usually indicate a poorer quality of life in these areas.  A difference of  2-3 points is a clinically meaningful difference.  A difference of 2-3 points in the total score of the Quality of Life Index has been associated with significant improvement in overall quality of life, self-image, physical symptoms, and general health in studies assessing change in quality of life.  PHQ-9: Review Flowsheet       11/16/2023 12/07/2012 11/21/2012  Depression screen PHQ 2/9  Decreased Interest 3 0 0  Down, Depressed, Hopeless 2 0 1  PHQ - 2 Score 5 0 1  Altered sleeping 3 - -  Tired, decreased energy 2 - -  Change in appetite 3 - -  Feeling bad or failure about yourself  2 - -  Trouble concentrating 3 - -  Moving slowly or fidgety/restless 3 - -  Suicidal thoughts 0 - -  PHQ-9 Score 21  - -  Difficult doing work/chores Very difficult - -    Details       Data saved with a previous flowsheet row definition        Interpretation of Total Score  Total Score Depression Severity:  1-4 = Minimal depression, 5-9 = Mild depression, 10-14 = Moderate depression, 15-19 = Moderately severe depression, 20-27 = Severe depression   Psychosocial Evaluation and Intervention:   Psychosocial Re-Evaluation:   Psychosocial Discharge (Final Psychosocial Re-Evaluation):   Education: Education Goals: Education classes will be provided on a  weekly basis, covering required topics. Participant will state understanding/return demonstration of topics presented.  Learning Barriers/Preferences:   General Pulmonary Education Topics:  Infection Prevention: - Provides verbal and written material to individual with discussion of infection control including proper hand washing and proper equipment cleaning during exercise session.   Falls Prevention: - Provides verbal and written material to individual with discussion of falls prevention and safety.   Chronic Lung Disease Review: - Group verbal instruction with posters, models, PowerPoint presentations and videos,  to review new updates, new respiratory medications, new advancements in procedures and treatments. Providing information on websites and 800 numbers for continued self-education. Includes information about supplement oxygen , available portable oxygen  systems, continuous and intermittent flow rates, oxygen  safety, concentrators, and Medicare reimbursement for oxygen . Explanation of Pulmonary Drugs, including class, frequency, complications, importance of spacers, rinsing mouth after steroid MDI's, and proper cleaning methods for nebulizers. Review of basic lung anatomy and physiology related to function, structure, and complications of lung disease. Review of risk factors. Discussion about methods for diagnosing sleep apnea and types of masks and machines for OSA. Includes a review of the use of types of environmental controls: home humidity, furnaces, filters, dust mite/pet prevention, HEPA vacuums. Discussion about weather changes, air quality and the benefits of nasal washing. Instruction on Warning signs, infection symptoms, calling MD promptly, preventive modes, and value of vaccinations. Review of effective airway clearance, coughing and/or vibration  techniques. Emphasizing that all should Create an Action Plan. Written material provided at class time. Flowsheet Row PULMONARY  REHAB CHRONIC OBSTRUCTIVE PULMONARY DISEASE from 12/15/2023 in [place] [facility]  Date 11/30/23  Educator [facility]  Instruction Review Code 1- Verbalizes Understanding    AED/CPR: - Group verbal and written instruction with the use of models to demonstrate the basic use of the AED with the basic ABC's of resuscitation.    Tests and Procedures:  - Group verbal and visual presentation and models provide information about basic cardiac anatomy and function. Reviews the testing methods done to diagnose heart disease and the outcomes of the test results. Describes the treatment choices: Medical Management, Angioplasty, or Coronary Bypass Surgery for treating various heart conditions including Myocardial Infarction, Angina, Valve Disease, and Cardiac Arrhythmias.  Written material provided at class time. Flowsheet Row PULMONARY REHAB CHRONIC OBSTRUCTIVE PULMONARY DISEASE from 12/15/2023 in [place] [facility]  Date 12/08/23  Educator [facility]  Instruction Review Code 1- Verbalizes Understanding    Medication Safety: - Group verbal and visual instruction to review commonly prescribed medications for heart and lung disease. Reviews the medication, class of the drug, and side effects. Includes the steps to properly store meds and maintain the prescription regimen.  Written material given at graduation. Flowsheet Row PULMONARY REHAB CHRONIC OBSTRUCTIVE PULMONARY DISEASE from 12/15/2023 in [place] [facility]  Date 11/23/23  Educator [facility]  Instruction Review Code 1- Verbalizes Understanding    Other: -Provides group and verbal instruction on various topics (see comments)   Knowledge Questionnaire Score:    Core Components/Risk Factors/Patient Goals at Admission:   Education:Diabetes - Individual verbal and written instruction to review signs/symptoms of diabetes, desired ranges of glucose level fasting, after meals and with exercise. Acknowledge that pre and  post exercise glucose checks will be done for 3 sessions at entry of program.   Know Your Numbers and Heart Failure: - Group verbal and visual instruction to discuss disease risk factors for cardiac and pulmonary disease and treatment options.  Reviews associated critical values for Overweight/Obesity, Hypertension, Cholesterol, and Diabetes.  Discusses basics of heart failure: signs/symptoms and treatments.  Introduces Heart Failure Zone chart for action plan for heart failure. Written material provided at class time.   Core Components/Risk Factors/Patient Goals Review:    Core Components/Risk Factors/Patient Goals at Discharge (Final Review):    ITP Comments:  ITP Comments     Row Name 01/24/24 1019 02/21/24 1413 02/29/24 1548       ITP Comments 30 day review completed. ITP sent to Dr.Jehanzeb Memon, Medical Director of  Pulmonary Rehab. Continue with ITP unless changes are made by physician.    Currently out on medical hold 30 day review completed. ITP sent to Dr.Jehanzeb Memon, Medical Director of  Pulmonary Rehab. Continue with ITP unless changes are made by physician.    Currently out on medical hold til January. Discharging patient due to lack of attendance        Comments: Discharge ITP      [1]  Current Outpatient Medications:    acetaminophen  (TYLENOL ) 325 MG tablet, Take 2 tablets (650 mg total) by mouth every 6 (six) hours as needed for mild pain (or Fever >/= 101)., Disp: 12 tablet, Rfl: 1   albuterol  (PROVENTIL ) (2.5 MG/3ML) 0.083% nebulizer solution, Take 3 mLs (2.5 mg total) by nebulization 3 (three) times daily as needed for wheezing or shortness of breath., Disp: 75 mL, Rfl: 12   amLODipine  (NORVASC ) 10 MG tablet,  Take 5 mg by mouth daily., Disp: , Rfl:    aspirin  EC 81 MG tablet, Take 1 tablet (81 mg total) by mouth daily with breakfast., Disp: 30 tablet, Rfl: 2   atorvastatin  (LIPITOR) 40 MG tablet, Take 20 mg by mouth at bedtime., Disp: , Rfl:    azelastine  (ASTELIN) 0.1 % nasal spray, Place 2 sprays into both nostrils 2 (two) times daily. Use in each nostril as directed, Disp: , Rfl:    azithromycin  (ZITHROMAX ) 250 MG tablet, Take 250 mg by mouth 3 (three) times a week. M,W,F, Disp: , Rfl:    bacitracin ointment, Apply 1 Application topically 2 (two) times daily as needed (skin abrasion)., Disp: , Rfl:    Budeson-Glycopyrrol-Formoterol  (BREZTRI AEROSPHERE) 160-9-4.8 MCG/ACT AERO, Inhale 2 puffs into the lungs in the morning and at bedtime., Disp: , Rfl:    budesonide -formoterol  (SYMBICORT ) 160-4.5 MCG/ACT inhaler, Inhale 2 puffs into the lungs 2 (two) times daily., Disp: 1 each, Rfl: 12   buPROPion  (ZYBAN ) 150 MG 12 hr tablet, Take 150 mg by mouth daily., Disp: , Rfl:    carboxymethylcellulose (REFRESH PLUS) 0.5 % SOLN, Place 1 drop into both eyes 4 (four) times daily as needed (for dry eyes)., Disp: , Rfl:    carvedilol  (COREG ) 25 MG tablet, Take 1 tablet (25 mg total) by mouth 2 (two) times daily with a meal., Disp: 60 tablet, Rfl: 1   cetirizine (ZYRTEC) 10 MG tablet, Take 10 mg by mouth at bedtime., Disp: , Rfl:    Cholecalciferol  (VITAMIN D  PO), Take 1,000 Units by mouth daily., Disp: , Rfl:    divalproex  (DEPAKOTE  ER) 500 MG 24 hr tablet, Take 500 mg by mouth daily., Disp: , Rfl:    Emollient (EUCERIN DAILY HYDRATION) CREA, Apply 1 Application topically in the morning and at bedtime., Disp: , Rfl:    famotidine  (PEPCID ) 40 MG tablet, Take 40 mg by mouth 2 (two) times daily., Disp: , Rfl:    finasteride (PROSCAR) 5 MG tablet, Take 5 mg by mouth daily., Disp: , Rfl:    gabapentin  (NEURONTIN ) 300 MG capsule, Take 300 mg by mouth at bedtime., Disp: , Rfl:    guaiFENesin  (ROBITUSSIN) 100 MG/5ML liquid, Take 10 mLs by mouth every 4 (four) hours as needed for cough or to loosen phlegm., Disp: , Rfl:    ipratropium (ATROVENT ) 0.06 % nasal spray, Place 2 sprays into both nostrils in the morning and at bedtime., Disp: , Rfl:    lactobacillus acidophilus  (BACID) TABS tablet, Take 1 tablet by mouth daily., Disp: , Rfl:    loratadine  (CLARITIN ) 10 MG tablet, Take 1 tablet (10 mg total) by mouth daily., Disp: 30 tablet, Rfl: 0   Melatonin 5 MG TABS, Take 10 mg by mouth at bedtime., Disp: , Rfl:    pantoprazole  (PROTONIX ) 40 MG tablet, Take 40 mg by mouth daily., Disp: , Rfl:    predniSONE  (DELTASONE ) 50 MG tablet, Take 1 tablet (50 mg total) by mouth 2 (two) times daily with a meal., Disp: 14 tablet, Rfl: 0   tamsulosin  (FLOMAX ) 0.4 MG CAPS capsule, Take 0.8 mg by mouth daily after supper., Disp: , Rfl:  [2]  Social History Tobacco Use  Smoking Status Former   Current packs/day: 0.25   Average packs/day: 0.3 packs/day for 35.0 years (8.8 ttl pk-yrs)   Types: Cigarettes  Smokeless Tobacco Never

## 2024-02-29 NOTE — Telephone Encounter (Signed)
 Called patient to check in and see about returning to rehab. He would like to be discharged. We will discharge pt.

## 2024-02-29 NOTE — Progress Notes (Signed)
 Discharge Progress Report  Patient Details  Name: Mark Schroeder MRN: [id number] Date of Birth: 09-Jun-1945 Referring Provider:   Flowsheet Row PULMONARY REHAB COPD ORIENTATION from 11/16/2023 in [facility] CARDIAC REHABILITATION  Referring Provider Candyce Caraway MD  [[facility]]     Number of Visits: 12  Reason for Discharge:  Early Exit:  Personal and Lack of attendance  Smoking History:  Tobacco Use History[1]  Diagnosis:  Chronic obstructive pulmonary disease, unspecified COPD type (HCC)  ADL UCSD:   Initial Exercise Prescription:   Discharge Exercise Prescription (Final Exercise Prescription Changes):   Functional Capacity:   Psychological, QOL, Others - Outcomes: PHQ 2/9:    11/16/2023    2:57 PM 12/07/2012    8:30 AM 11/21/2012   11:30 AM  Depression screen PHQ 2/9  Decreased Interest 3 0 0  Down, Depressed, Hopeless 2 0 1  PHQ - 2 Score 5 0 1  Altered sleeping 3    Tired, decreased energy 2    Change in appetite 3    Feeling bad or failure about yourself  2    Trouble concentrating 3    Moving slowly or fidgety/restless 3    Suicidal thoughts 0    PHQ-9 Score 21     Difficult doing work/chores Very difficult       Data saved with a previous flowsheet row definition    Quality of Life:   Personal Goals: Goals established at orientation with interventions provided to work toward goal.    Personal Goals Discharge:   Exercise Goals and Review:   Exercise Goals Re-Evaluation:   Nutrition & Weight - Outcomes:    Nutrition:   Nutrition Discharge:   Education Questionnaire Score:   Goals reviewed with patient; copy given to patient.    [1]  Social History Tobacco Use  Smoking Status Former   Current packs/day: 0.25   Average packs/day: 0.3 packs/day for 35.0 years (8.8 ttl pk-yrs)   Types: Cigarettes  Smokeless Tobacco Never

## 2024-03-02 ENCOUNTER — Encounter (HOSPITAL_COMMUNITY)

## 2024-03-05 ENCOUNTER — Encounter (HOSPITAL_COMMUNITY)

## 2024-03-07 ENCOUNTER — Encounter (HOSPITAL_COMMUNITY)

## 2024-03-09 ENCOUNTER — Encounter (HOSPITAL_COMMUNITY)

## 2024-03-12 ENCOUNTER — Encounter (HOSPITAL_COMMUNITY)

## 2024-03-13 NOTE — Telephone Encounter (Signed)
 Called pt seeing if he would be back to rehab anytime soon and how him and his wife were doing. He stated they both were having issues and he wouldn't be back soon. Taking out until first of the new year.

## 2024-03-14 ENCOUNTER — Encounter (HOSPITAL_COMMUNITY)

## 2024-03-16 ENCOUNTER — Encounter (HOSPITAL_COMMUNITY)
